# Patient Record
Sex: Female | Born: 1970 | Race: Black or African American | Hispanic: No | Marital: Married | Smoking: Never smoker
Health system: Southern US, Community
[De-identification: ages and names within clinical notes are randomized; demographics above are authoritative.]

## PROBLEM LIST (undated history)

## (undated) DIAGNOSIS — Z8632 Personal history of gestational diabetes: Secondary | ICD-10-CM

## (undated) DIAGNOSIS — N920 Excessive and frequent menstruation with regular cycle: Secondary | ICD-10-CM

## (undated) DIAGNOSIS — D259 Leiomyoma of uterus, unspecified: Secondary | ICD-10-CM

## (undated) DIAGNOSIS — F32A Depression, unspecified: Secondary | ICD-10-CM

## (undated) DIAGNOSIS — D509 Iron deficiency anemia, unspecified: Secondary | ICD-10-CM

## (undated) DIAGNOSIS — Z8719 Personal history of other diseases of the digestive system: Secondary | ICD-10-CM

## (undated) DIAGNOSIS — M810 Age-related osteoporosis without current pathological fracture: Secondary | ICD-10-CM

## (undated) DIAGNOSIS — Z8759 Personal history of other complications of pregnancy, childbirth and the puerperium: Secondary | ICD-10-CM

## (undated) DIAGNOSIS — E785 Hyperlipidemia, unspecified: Secondary | ICD-10-CM

## (undated) DIAGNOSIS — Z8619 Personal history of other infectious and parasitic diseases: Secondary | ICD-10-CM

## (undated) DIAGNOSIS — Z973 Presence of spectacles and contact lenses: Secondary | ICD-10-CM

## (undated) DIAGNOSIS — F419 Anxiety disorder, unspecified: Secondary | ICD-10-CM

## (undated) DIAGNOSIS — I10 Essential (primary) hypertension: Secondary | ICD-10-CM

## (undated) DIAGNOSIS — E119 Type 2 diabetes mellitus without complications: Secondary | ICD-10-CM

## (undated) DIAGNOSIS — N200 Calculus of kidney: Secondary | ICD-10-CM

## (undated) DIAGNOSIS — D271 Benign neoplasm of left ovary: Secondary | ICD-10-CM

## (undated) DIAGNOSIS — M1990 Unspecified osteoarthritis, unspecified site: Secondary | ICD-10-CM

## (undated) HISTORY — DX: Type 2 diabetes mellitus without complications: E11.9

## (undated) HISTORY — DX: Anxiety disorder, unspecified: F41.9

## (undated) HISTORY — PX: ABDOMINAL HYSTERECTOMY: SHX81

## (undated) HISTORY — DX: Depression, unspecified: F32.A

## (undated) HISTORY — DX: Age-related osteoporosis without current pathological fracture: M81.0

---
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
## 1998-03-05 ENCOUNTER — Emergency Department (HOSPITAL_COMMUNITY): Admission: EM | Admit: 1998-03-05 | Discharge: 1998-03-05 | Payer: Self-pay | Admitting: Family Medicine

## 1998-03-26 ENCOUNTER — Encounter: Admission: RE | Admit: 1998-03-26 | Discharge: 1998-06-24 | Payer: Self-pay | Admitting: *Deleted

## 1998-07-03 ENCOUNTER — Encounter: Admission: RE | Admit: 1998-07-03 | Discharge: 1998-07-03 | Payer: Self-pay | Admitting: Family Medicine

## 1998-07-04 ENCOUNTER — Encounter: Admission: RE | Admit: 1998-07-04 | Discharge: 1998-07-04 | Payer: Self-pay | Admitting: Family Medicine

## 1998-08-05 ENCOUNTER — Encounter: Admission: RE | Admit: 1998-08-05 | Discharge: 1998-08-05 | Payer: Self-pay | Admitting: Family Medicine

## 1998-08-25 ENCOUNTER — Encounter: Admission: RE | Admit: 1998-08-25 | Discharge: 1998-08-25 | Payer: Self-pay | Admitting: Family Medicine

## 1998-09-25 ENCOUNTER — Encounter: Admission: RE | Admit: 1998-09-25 | Discharge: 1998-09-25 | Payer: Self-pay | Admitting: Family Medicine

## 1998-10-08 ENCOUNTER — Ambulatory Visit (HOSPITAL_COMMUNITY): Admission: RE | Admit: 1998-10-08 | Discharge: 1998-10-08 | Payer: Self-pay

## 1998-10-27 ENCOUNTER — Encounter: Admission: RE | Admit: 1998-10-27 | Discharge: 1998-10-27 | Payer: Self-pay | Admitting: Family Medicine

## 1998-11-12 ENCOUNTER — Encounter: Admission: RE | Admit: 1998-11-12 | Discharge: 1998-11-12 | Payer: Self-pay | Admitting: Family Medicine

## 1998-11-26 ENCOUNTER — Encounter: Admission: RE | Admit: 1998-11-26 | Discharge: 1998-11-26 | Payer: Self-pay | Admitting: Family Medicine

## 1998-12-12 ENCOUNTER — Encounter: Admission: RE | Admit: 1998-12-12 | Discharge: 1998-12-12 | Payer: Self-pay | Admitting: Family Medicine

## 1999-01-15 ENCOUNTER — Encounter: Admission: RE | Admit: 1999-01-15 | Discharge: 1999-01-15 | Payer: Self-pay | Admitting: Family Medicine

## 1999-01-20 ENCOUNTER — Encounter: Admission: RE | Admit: 1999-01-20 | Discharge: 1999-01-20 | Payer: Self-pay | Admitting: Sports Medicine

## 1999-01-29 ENCOUNTER — Encounter: Admission: RE | Admit: 1999-01-29 | Discharge: 1999-01-29 | Payer: Self-pay | Admitting: Family Medicine

## 1999-01-30 ENCOUNTER — Encounter: Admission: RE | Admit: 1999-01-30 | Discharge: 1999-01-30 | Payer: Self-pay | Admitting: Sports Medicine

## 1999-02-04 ENCOUNTER — Ambulatory Visit (HOSPITAL_COMMUNITY): Admission: RE | Admit: 1999-02-04 | Discharge: 1999-02-04 | Payer: Self-pay | Admitting: *Deleted

## 1999-02-05 ENCOUNTER — Encounter: Admission: RE | Admit: 1999-02-05 | Discharge: 1999-05-06 | Payer: Self-pay | Admitting: *Deleted

## 1999-02-06 ENCOUNTER — Encounter: Admission: RE | Admit: 1999-02-06 | Discharge: 1999-02-06 | Payer: Self-pay | Admitting: Family Medicine

## 1999-02-13 ENCOUNTER — Encounter: Admission: RE | Admit: 1999-02-13 | Discharge: 1999-02-13 | Payer: Self-pay | Admitting: Family Medicine

## 1999-02-26 ENCOUNTER — Inpatient Hospital Stay (HOSPITAL_COMMUNITY): Admission: AD | Admit: 1999-02-26 | Discharge: 1999-02-26 | Payer: Self-pay | Admitting: *Deleted

## 1999-02-27 ENCOUNTER — Encounter (INDEPENDENT_AMBULATORY_CARE_PROVIDER_SITE_OTHER): Payer: Self-pay

## 1999-02-27 ENCOUNTER — Inpatient Hospital Stay (HOSPITAL_COMMUNITY): Admission: AD | Admit: 1999-02-27 | Discharge: 1999-03-03 | Payer: Self-pay | Admitting: *Deleted

## 1999-03-03 ENCOUNTER — Encounter: Admission: RE | Admit: 1999-03-03 | Discharge: 1999-03-03 | Payer: Self-pay | Admitting: Family Medicine

## 1999-05-04 ENCOUNTER — Other Ambulatory Visit: Admission: RE | Admit: 1999-05-04 | Discharge: 1999-05-04 | Payer: Self-pay | Admitting: Emergency Medicine

## 1999-05-04 ENCOUNTER — Encounter: Admission: RE | Admit: 1999-05-04 | Discharge: 1999-05-04 | Payer: Self-pay | Admitting: Family Medicine

## 1999-05-11 ENCOUNTER — Encounter: Admission: RE | Admit: 1999-05-11 | Discharge: 1999-05-11 | Payer: Self-pay | Admitting: Family Medicine

## 1999-05-18 ENCOUNTER — Encounter: Admission: RE | Admit: 1999-05-18 | Discharge: 1999-05-18 | Payer: Self-pay | Admitting: Family Medicine

## 1999-07-06 ENCOUNTER — Encounter: Admission: RE | Admit: 1999-07-06 | Discharge: 1999-07-06 | Payer: Self-pay | Admitting: Family Medicine

## 1999-07-29 ENCOUNTER — Encounter: Admission: RE | Admit: 1999-07-29 | Discharge: 1999-07-29 | Payer: Self-pay | Admitting: Sports Medicine

## 1999-08-13 ENCOUNTER — Encounter: Admission: RE | Admit: 1999-08-13 | Discharge: 1999-08-13 | Payer: Self-pay | Admitting: Family Medicine

## 1999-09-14 ENCOUNTER — Encounter: Admission: RE | Admit: 1999-09-14 | Discharge: 1999-09-14 | Payer: Self-pay | Admitting: Family Medicine

## 2000-07-04 ENCOUNTER — Encounter: Admission: RE | Admit: 2000-07-04 | Discharge: 2000-07-04 | Payer: Self-pay | Admitting: Family Medicine

## 2000-11-14 ENCOUNTER — Encounter: Admission: RE | Admit: 2000-11-14 | Discharge: 2000-11-14 | Payer: Self-pay | Admitting: Family Medicine

## 2002-04-26 ENCOUNTER — Emergency Department (HOSPITAL_COMMUNITY): Admission: EM | Admit: 2002-04-26 | Discharge: 2002-04-26 | Payer: Self-pay | Admitting: Emergency Medicine

## 2002-04-27 ENCOUNTER — Encounter: Admission: RE | Admit: 2002-04-27 | Discharge: 2002-04-27 | Payer: Self-pay | Admitting: Family Medicine

## 2002-10-23 ENCOUNTER — Encounter: Admission: RE | Admit: 2002-10-23 | Discharge: 2002-10-23 | Payer: Self-pay | Admitting: Family Medicine

## 2002-10-25 ENCOUNTER — Encounter: Admission: RE | Admit: 2002-10-25 | Discharge: 2002-10-25 | Payer: Self-pay | Admitting: Family Medicine

## 2004-09-09 ENCOUNTER — Ambulatory Visit: Payer: Self-pay | Admitting: Family Medicine

## 2004-10-03 ENCOUNTER — Encounter (INDEPENDENT_AMBULATORY_CARE_PROVIDER_SITE_OTHER): Payer: Self-pay | Admitting: *Deleted

## 2004-10-03 LAB — CONVERTED CEMR LAB

## 2004-10-08 ENCOUNTER — Ambulatory Visit: Payer: Self-pay | Admitting: Family Medicine

## 2004-11-12 ENCOUNTER — Ambulatory Visit: Payer: Self-pay | Admitting: Family Medicine

## 2004-12-10 ENCOUNTER — Ambulatory Visit (HOSPITAL_COMMUNITY): Admission: RE | Admit: 2004-12-10 | Discharge: 2004-12-10 | Payer: Self-pay | Admitting: Family Medicine

## 2004-12-14 ENCOUNTER — Ambulatory Visit: Payer: Self-pay | Admitting: Family Medicine

## 2005-01-04 ENCOUNTER — Ambulatory Visit: Payer: Self-pay | Admitting: Family Medicine

## 2005-01-05 ENCOUNTER — Ambulatory Visit: Payer: Self-pay | Admitting: Family Medicine

## 2005-01-11 ENCOUNTER — Ambulatory Visit: Payer: Self-pay | Admitting: Family Medicine

## 2005-01-16 ENCOUNTER — Ambulatory Visit: Payer: Self-pay | Admitting: Obstetrics & Gynecology

## 2005-01-16 ENCOUNTER — Inpatient Hospital Stay (HOSPITAL_COMMUNITY): Admission: AD | Admit: 2005-01-16 | Discharge: 2005-01-16 | Payer: Self-pay | Admitting: Obstetrics & Gynecology

## 2005-01-26 ENCOUNTER — Ambulatory Visit: Payer: Self-pay | Admitting: Family Medicine

## 2005-01-29 ENCOUNTER — Ambulatory Visit: Payer: Self-pay | Admitting: Family Medicine

## 2005-02-02 ENCOUNTER — Encounter: Admission: RE | Admit: 2005-02-02 | Discharge: 2005-02-02 | Payer: Self-pay | Admitting: Family Medicine

## 2005-02-03 ENCOUNTER — Ambulatory Visit: Payer: Self-pay | Admitting: Family Medicine

## 2005-02-10 ENCOUNTER — Ambulatory Visit (HOSPITAL_COMMUNITY): Admission: RE | Admit: 2005-02-10 | Discharge: 2005-02-10 | Payer: Self-pay | Admitting: Obstetrics & Gynecology

## 2005-02-18 ENCOUNTER — Ambulatory Visit: Payer: Self-pay | Admitting: Sports Medicine

## 2005-03-02 ENCOUNTER — Ambulatory Visit: Payer: Self-pay | Admitting: Sports Medicine

## 2005-03-05 ENCOUNTER — Ambulatory Visit: Payer: Self-pay | Admitting: *Deleted

## 2005-03-09 ENCOUNTER — Ambulatory Visit: Payer: Self-pay | Admitting: *Deleted

## 2005-03-11 ENCOUNTER — Ambulatory Visit: Payer: Self-pay | Admitting: *Deleted

## 2005-03-15 ENCOUNTER — Ambulatory Visit: Payer: Self-pay | Admitting: Obstetrics & Gynecology

## 2005-03-16 ENCOUNTER — Ambulatory Visit: Payer: Self-pay | Admitting: Family Medicine

## 2005-03-23 ENCOUNTER — Ambulatory Visit: Payer: Self-pay | Admitting: *Deleted

## 2005-03-24 ENCOUNTER — Ambulatory Visit: Payer: Self-pay | Admitting: Family Medicine

## 2005-03-25 ENCOUNTER — Ambulatory Visit: Payer: Self-pay | Admitting: Obstetrics & Gynecology

## 2005-03-30 ENCOUNTER — Ambulatory Visit: Payer: Self-pay | Admitting: Obstetrics & Gynecology

## 2005-03-30 ENCOUNTER — Ambulatory Visit (HOSPITAL_COMMUNITY): Admission: RE | Admit: 2005-03-30 | Discharge: 2005-03-30 | Payer: Self-pay | Admitting: Family Medicine

## 2005-04-02 ENCOUNTER — Ambulatory Visit: Payer: Self-pay | Admitting: Family Medicine

## 2005-04-06 ENCOUNTER — Ambulatory Visit: Payer: Self-pay | Admitting: *Deleted

## 2005-04-07 ENCOUNTER — Ambulatory Visit: Payer: Self-pay | Admitting: Family Medicine

## 2005-04-09 ENCOUNTER — Ambulatory Visit: Payer: Self-pay | Admitting: Obstetrics and Gynecology

## 2005-04-09 ENCOUNTER — Ambulatory Visit: Payer: Self-pay | Admitting: *Deleted

## 2005-04-09 ENCOUNTER — Inpatient Hospital Stay (HOSPITAL_COMMUNITY): Admission: AD | Admit: 2005-04-09 | Discharge: 2005-04-12 | Payer: Self-pay | Admitting: *Deleted

## 2005-05-19 ENCOUNTER — Ambulatory Visit: Payer: Self-pay | Admitting: Family Medicine

## 2005-05-31 ENCOUNTER — Ambulatory Visit: Payer: Self-pay | Admitting: Family Medicine

## 2005-08-04 ENCOUNTER — Ambulatory Visit: Payer: Self-pay | Admitting: Family Medicine

## 2005-08-25 ENCOUNTER — Ambulatory Visit: Payer: Self-pay | Admitting: Family Medicine

## 2005-09-03 DIAGNOSIS — Z8619 Personal history of other infectious and parasitic diseases: Secondary | ICD-10-CM

## 2005-09-03 HISTORY — DX: Personal history of other infectious and parasitic diseases: Z86.19

## 2005-10-02 ENCOUNTER — Inpatient Hospital Stay (HOSPITAL_COMMUNITY): Admission: EM | Admit: 2005-10-02 | Discharge: 2005-10-06 | Payer: Self-pay | Admitting: Emergency Medicine

## 2005-10-04 ENCOUNTER — Encounter (INDEPENDENT_AMBULATORY_CARE_PROVIDER_SITE_OTHER): Payer: Self-pay | Admitting: Specialist

## 2005-10-04 HISTORY — PX: LAPAROSCOPIC CHOLECYSTECTOMY: SUR755

## 2005-10-09 ENCOUNTER — Inpatient Hospital Stay (HOSPITAL_COMMUNITY): Admission: EM | Admit: 2005-10-09 | Discharge: 2005-10-12 | Payer: Self-pay | Admitting: Emergency Medicine

## 2005-10-11 HISTORY — PX: ERCP W/ SPHINCTEROTOMY AND BALLOON DILATION: SHX1524

## 2006-06-02 DIAGNOSIS — I10 Essential (primary) hypertension: Secondary | ICD-10-CM | POA: Insufficient documentation

## 2006-06-03 ENCOUNTER — Encounter (INDEPENDENT_AMBULATORY_CARE_PROVIDER_SITE_OTHER): Payer: Self-pay | Admitting: *Deleted

## 2007-05-04 ENCOUNTER — Encounter (INDEPENDENT_AMBULATORY_CARE_PROVIDER_SITE_OTHER): Payer: Self-pay | Admitting: *Deleted

## 2007-05-15 ENCOUNTER — Ambulatory Visit: Payer: Self-pay | Admitting: Sports Medicine

## 2007-05-15 ENCOUNTER — Encounter: Payer: Self-pay | Admitting: Family Medicine

## 2007-05-15 LAB — CONVERTED CEMR LAB
Antibody Screen: NEGATIVE
Basophils Relative: 0 % (ref 0–1)
Beta hcg, urine, semiquantitative: POSITIVE
Eosinophils Absolute: 0.1 10*3/uL (ref 0.0–0.7)
HCT: 33.7 % — ABNORMAL LOW (ref 36.0–46.0)
Lymphocytes Relative: 29 % (ref 12–46)
MCV: 89.6 fL (ref 78.0–100.0)
Monocytes Relative: 7 % (ref 3–12)
Neutro Abs: 4.7 10*3/uL (ref 1.7–7.7)
Neutrophils Relative %: 63 % (ref 43–77)
Platelets: 303 10*3/uL (ref 150–400)
RBC: 3.76 M/uL — ABNORMAL LOW (ref 3.87–5.11)
RDW: 13.8 % (ref 11.5–15.5)
Rh Type: POSITIVE
Rubella: 116.1 intl units/mL — ABNORMAL HIGH
WBC: 7.5 10*3/uL (ref 4.0–10.5)

## 2007-05-16 ENCOUNTER — Encounter: Payer: Self-pay | Admitting: Family Medicine

## 2007-05-17 ENCOUNTER — Telehealth (INDEPENDENT_AMBULATORY_CARE_PROVIDER_SITE_OTHER): Payer: Self-pay | Admitting: *Deleted

## 2007-05-19 ENCOUNTER — Ambulatory Visit: Payer: Self-pay | Admitting: Family Medicine

## 2007-05-22 ENCOUNTER — Ambulatory Visit: Payer: Self-pay | Admitting: Family Medicine

## 2007-05-23 ENCOUNTER — Encounter: Payer: Self-pay | Admitting: Family Medicine

## 2007-05-23 ENCOUNTER — Ambulatory Visit: Payer: Self-pay | Admitting: Family Medicine

## 2007-05-25 ENCOUNTER — Telehealth (INDEPENDENT_AMBULATORY_CARE_PROVIDER_SITE_OTHER): Payer: Self-pay | Admitting: *Deleted

## 2007-05-30 ENCOUNTER — Ambulatory Visit: Payer: Self-pay | Admitting: Family Medicine

## 2007-05-30 ENCOUNTER — Encounter: Payer: Self-pay | Admitting: Family Medicine

## 2007-06-12 ENCOUNTER — Encounter: Payer: Self-pay | Admitting: Family Medicine

## 2007-06-13 ENCOUNTER — Telehealth: Payer: Self-pay | Admitting: Family Medicine

## 2007-06-22 ENCOUNTER — Encounter: Payer: Self-pay | Admitting: Family Medicine

## 2007-06-22 ENCOUNTER — Ambulatory Visit: Payer: Self-pay | Admitting: Family Medicine

## 2007-06-26 ENCOUNTER — Ambulatory Visit: Payer: Self-pay | Admitting: Family Medicine

## 2007-06-26 ENCOUNTER — Encounter: Admission: RE | Admit: 2007-06-26 | Discharge: 2007-06-26 | Payer: Self-pay | Admitting: Family Medicine

## 2007-06-27 ENCOUNTER — Ambulatory Visit: Payer: Self-pay | Admitting: Obstetrics and Gynecology

## 2007-07-03 ENCOUNTER — Ambulatory Visit: Payer: Self-pay | Admitting: Obstetrics & Gynecology

## 2007-07-10 ENCOUNTER — Ambulatory Visit: Payer: Self-pay | Admitting: Obstetrics & Gynecology

## 2007-07-24 ENCOUNTER — Ambulatory Visit: Payer: Self-pay | Admitting: Obstetrics & Gynecology

## 2007-07-24 ENCOUNTER — Ambulatory Visit (HOSPITAL_COMMUNITY): Admission: RE | Admit: 2007-07-24 | Discharge: 2007-07-24 | Payer: Self-pay | Admitting: Obstetrics & Gynecology

## 2007-08-08 ENCOUNTER — Encounter: Payer: Self-pay | Admitting: Family Medicine

## 2007-08-14 ENCOUNTER — Ambulatory Visit: Payer: Self-pay | Admitting: Obstetrics & Gynecology

## 2007-08-24 ENCOUNTER — Ambulatory Visit: Payer: Self-pay | Admitting: Obstetrics & Gynecology

## 2007-09-11 ENCOUNTER — Ambulatory Visit (HOSPITAL_COMMUNITY): Admission: RE | Admit: 2007-09-11 | Discharge: 2007-09-11 | Payer: Self-pay | Admitting: Obstetrics & Gynecology

## 2007-09-11 ENCOUNTER — Ambulatory Visit: Payer: Self-pay | Admitting: Obstetrics & Gynecology

## 2007-10-02 ENCOUNTER — Ambulatory Visit (HOSPITAL_COMMUNITY): Admission: RE | Admit: 2007-10-02 | Discharge: 2007-10-02 | Payer: Self-pay | Admitting: Gynecology

## 2007-10-02 ENCOUNTER — Ambulatory Visit: Payer: Self-pay | Admitting: Family Medicine

## 2007-10-16 ENCOUNTER — Ambulatory Visit: Payer: Self-pay | Admitting: Obstetrics & Gynecology

## 2007-10-23 ENCOUNTER — Ambulatory Visit: Payer: Self-pay | Admitting: Obstetrics & Gynecology

## 2007-11-06 ENCOUNTER — Ambulatory Visit: Payer: Self-pay | Admitting: Family Medicine

## 2007-11-19 ENCOUNTER — Ambulatory Visit: Payer: Self-pay | Admitting: Family Medicine

## 2007-11-19 ENCOUNTER — Inpatient Hospital Stay (HOSPITAL_COMMUNITY): Admission: AD | Admit: 2007-11-19 | Discharge: 2007-11-22 | Payer: Self-pay | Admitting: Obstetrics & Gynecology

## 2007-11-24 ENCOUNTER — Telehealth: Payer: Self-pay | Admitting: Family Medicine

## 2007-12-22 ENCOUNTER — Encounter: Payer: Self-pay | Admitting: *Deleted

## 2008-01-02 ENCOUNTER — Ambulatory Visit: Payer: Self-pay | Admitting: Family Medicine

## 2008-04-23 ENCOUNTER — Telehealth: Payer: Self-pay | Admitting: *Deleted

## 2008-05-03 ENCOUNTER — Ambulatory Visit: Payer: Self-pay | Admitting: Family Medicine

## 2008-05-13 ENCOUNTER — Ambulatory Visit: Payer: Self-pay | Admitting: Family Medicine

## 2008-07-12 ENCOUNTER — Ambulatory Visit (HOSPITAL_COMMUNITY): Admission: RE | Admit: 2008-07-12 | Discharge: 2008-07-12 | Payer: Self-pay | Admitting: Family Medicine

## 2008-07-12 ENCOUNTER — Ambulatory Visit: Payer: Self-pay | Admitting: Family Medicine

## 2008-07-16 ENCOUNTER — Ambulatory Visit: Payer: Self-pay | Admitting: Family Medicine

## 2008-07-16 DIAGNOSIS — T8339XA Other mechanical complication of intrauterine contraceptive device, initial encounter: Secondary | ICD-10-CM

## 2008-08-14 ENCOUNTER — Telehealth: Payer: Self-pay | Admitting: Family Medicine

## 2009-09-04 ENCOUNTER — Ambulatory Visit: Payer: Self-pay | Admitting: Family Medicine

## 2009-09-04 DIAGNOSIS — M654 Radial styloid tenosynovitis [de Quervain]: Secondary | ICD-10-CM

## 2009-09-04 DIAGNOSIS — J029 Acute pharyngitis, unspecified: Secondary | ICD-10-CM | POA: Insufficient documentation

## 2009-09-04 LAB — CONVERTED CEMR LAB: Rapid Strep: NEGATIVE

## 2009-10-17 ENCOUNTER — Telehealth (INDEPENDENT_AMBULATORY_CARE_PROVIDER_SITE_OTHER): Payer: Self-pay | Admitting: *Deleted

## 2009-10-20 ENCOUNTER — Ambulatory Visit: Payer: Self-pay | Admitting: Family Medicine

## 2009-10-21 LAB — CONVERTED CEMR LAB
BUN: 14 mg/dL (ref 6–23)
CO2: 27 meq/L (ref 19–32)
Calcium: 8.6 mg/dL (ref 8.4–10.5)
GFR calc non Af Amer: 95.36 mL/min (ref >60)
HDL: 51.4 mg/dL (ref >39.00)
Potassium: 4.4 meq/L (ref 3.5–5.1)
Total CHOL/HDL Ratio: 4
VLDL: 6.8 mg/dL (ref 0.0–40.0)

## 2009-10-23 ENCOUNTER — Ambulatory Visit: Payer: Self-pay | Admitting: Family Medicine

## 2009-10-23 DIAGNOSIS — E1169 Type 2 diabetes mellitus with other specified complication: Secondary | ICD-10-CM | POA: Insufficient documentation

## 2009-10-23 DIAGNOSIS — E785 Hyperlipidemia, unspecified: Secondary | ICD-10-CM

## 2009-12-22 ENCOUNTER — Ambulatory Visit: Payer: Self-pay | Admitting: Family Medicine

## 2010-04-13 ENCOUNTER — Emergency Department (HOSPITAL_COMMUNITY)
Admission: EM | Admit: 2010-04-13 | Discharge: 2010-04-13 | Payer: Self-pay | Source: Home / Self Care | Admitting: Family Medicine

## 2010-04-13 ENCOUNTER — Telehealth: Payer: Self-pay | Admitting: Family Medicine

## 2010-04-15 ENCOUNTER — Ambulatory Visit
Admission: RE | Admit: 2010-04-15 | Discharge: 2010-04-15 | Payer: Self-pay | Source: Home / Self Care | Attending: Family Medicine | Admitting: Family Medicine

## 2010-04-15 DIAGNOSIS — L0291 Cutaneous abscess, unspecified: Secondary | ICD-10-CM | POA: Insufficient documentation

## 2010-04-15 DIAGNOSIS — L0390 Cellulitis, unspecified: Secondary | ICD-10-CM

## 2010-04-16 ENCOUNTER — Encounter: Payer: Self-pay | Admitting: Family Medicine

## 2010-04-16 ENCOUNTER — Ambulatory Visit
Admission: RE | Admit: 2010-04-16 | Discharge: 2010-04-16 | Payer: Self-pay | Source: Home / Self Care | Attending: Family Medicine | Admitting: Family Medicine

## 2010-04-17 ENCOUNTER — Encounter: Payer: Self-pay | Admitting: Family Medicine

## 2010-04-20 ENCOUNTER — Ambulatory Visit
Admission: RE | Admit: 2010-04-20 | Discharge: 2010-04-20 | Payer: Self-pay | Source: Home / Self Care | Attending: Family Medicine | Admitting: Family Medicine

## 2010-04-22 ENCOUNTER — Encounter: Payer: Self-pay | Admitting: Family Medicine

## 2010-04-27 ENCOUNTER — Encounter: Payer: Self-pay | Admitting: *Deleted

## 2010-05-05 NOTE — Progress Notes (Signed)
----   Converted from flag ---- ---- 10/17/2009 11:39 AM, Ruthe Mannan MD wrote: BMET (v70.0), FLP ( 77.91)  ---- 10/17/2009 11:33 AM, Mills Koller wrote: Patient coming in for CPX labs Monday, need orders and dx.... Thanks, T ------------------------------

## 2010-05-05 NOTE — Letter (Signed)
Summary: [facility] [facility] [employer]  [facility] [employer]   Imported By: Lester Benton 01/01/2010 96:29:52  _____________________________________________________________________  External Attachment:    Type:   Image     Comment:   External Document

## 2010-05-05 NOTE — Assessment & Plan Note (Signed)
Summary: NEW PT TO EST/PER DR Barbara [name]/[facility]   Vital Signs:  Patient profile:   40 year old female Height:      69.25 inches Weight:      217.25 pounds BMI:     31.97 Temp:     98.8 degrees F oral Pulse rate:   72 / minute Pulse rhythm:   regular BP sitting:   130 / 80  (right arm) Cuff size:   large  Vitals Entered By: Barbara [name] CMA Barbara [name]) (September 04, 2009 3:16 PM) CC: new patient   History of Present Illness: 40 yo here to re establish care with me ( was my patient at [facility]).  1.  Sore throat- ongoing for last two weeks.  Associated with subjective fever, ear pain, headache.  No cough, runny nose or other URI symptoms.  Has 3 small children at home.    2.  Left hand tingling.  Carries her smallest child on that arm frequently and noticed tingling around her thumb and index finger.  No weakness or pain.  Never had anything like this before.  3.  Well woman- due for pap, FLP, BMET. Husband has vasectomy now and would like her IUD removed.  Current Medications (verified): 1)  Penicillin V Potassium 500 Mg Tabs (Penicillin V Potassium) .Marland Kitchen.. 1 Tab By Mouth Three Times A Day X 10 Days  Allergies (verified): No Known Drug Allergies  Past History:  Past Medical History: Last updated: 06/02/2006 Anemia, acute blood loss in high school, h/o Glucose intolerance during pregnancy, h/o PIH (Severe, required MgSO4), Paronychia, toe 681.11, Postpartum endometritis  Past Surgical History: Last updated: 06/02/2006 LTCS - 02/04/1999, VBAC 1/06 - 05/19/2005  Family History: Last updated: 06/02/2006 STRONG FHx for DM  Social History: Last updated: 07/16/2008 Married to Barbara [name], had son Barbara [name] 11/00, Barbara [name], and Barbara [name]; Works as Editor, commissioning; Never smoked, no alcohol  Risk Factors: Smoking Status: never (07/16/2008) Packs/Day: n/a (05/23/2007)  Social History: Reviewed history from 07/16/2008 and no changes required. Married to Barbara [name], had son Barbara [name] 11/00,  Barbara [name], and Barbara [name]; Works as Editor, commissioning; Never smoked, no alcohol  Review of Systems      See HPI General:  Complains of chills and fever. ENT:  Complains of sore throat; denies difficulty swallowing, nasal congestion, postnasal drainage, and sinus pressure. CV:  Denies chest pain or discomfort. Resp:  Denies cough. GI:  Denies abdominal pain. MS:  Denies joint pain, joint redness, and joint swelling. Derm:  Denies rash. Neuro:  Complains of headaches. Psych:  Denies anxiety and depression. Endo:  Denies weight change.  Physical Exam  General:  Well-developed,well-nourished,in no acute distress; alert,appropriate and cooperative throughout examination Eyes:  vision grossly intact, pupils equal, and pupils round.   Ears:  R ear normal and L ear normal.   Nose:  External nasal examination shows no deformity or inflammation. Nasal mucosa are pink and moist without lesions or exudates. Mouth:  pharyngeal erythema, pharyngeal exudate, and posterior lymphoid hypertrophy.   Lungs:  Normal respiratory effort, chest expands symmetrically. Lungs are clear to auscultation, no crackles or wheezes. Heart:  Normal rate and regular rhythm. S1 and S2 normal without gallop, murmur, click, rub or other extra sounds. Abdomen:  Bowel sounds positive,abdomen soft and non-tender without masses, organomegaly or hernias noted. Msk:  Left wrist- FROM, normal strength. Pos Finklestein test left Extremities:  No clubbing, cyanosis, edema, or deformity noted with normal full range of motion of all joints.   Neurologic:  alert &  oriented X3.   Skin:  Intact without suspicious lesions or rashes Cervical Nodes:  R anterior LN tender.   Psych:  Cognition and judgment appear intact. Alert and cooperative with normal attention span and concentration. No apparent delusions, illusions, hallucinations   Impression & Recommendations:  Problem # 1:  ACUTE PHARYNGITIS (ICD-462) Assessment New Although Rapid strep is  neg, have all cardinal symptoms and PE exam findings.  will treat for presumptive strep pharyngitis with PCN. Her updated medication list for this problem includes:    Penicillin V Potassium 500 Mg Tabs (Penicillin v potassium) .Marland Kitchen... 1 tab by mouth three times a day x 10 days  Orders: Rapid Strep ([address])  Problem # 2:  DE QUERVAIN'S TENOSYNOVITIS (ICD-727.04) Assessment: New Discussed using NSAIDs and given thumb spica splint.  Pt to follow up 1st week of july when she schedules CPX. Orders: Slings- All Types (U0454)  Problem # 3:  IUD (ICD-V25.1) Assessment: Unchanged Plan IUD removal with Pap.  Complete Medication List: 1)  Penicillin V Potassium 500 Mg Tabs (Penicillin v potassium) .Marland Kitchen.. 1 tab by mouth three times a day x 10 days  Patient Instructions: 1)  Great to see you, Barbara [name]! 2)  Rest your wrist, wear the splint at night- deQuervain's synovitis. 3)  Make an appointment for a complete physical on your way out. Prescriptions: PENICILLIN V POTASSIUM 500 MG TABS (PENICILLIN V POTASSIUM) 1 tab by mouth three times a day x 10 days  #30 x 0   Entered and Authorized by:   Ruthe Mannan MD   Signed by:   Ruthe Mannan MD on 09/04/2009   Method used:   Electronically to        [facility] (retail)       [address]       [place], [place]  [address]       Ph: [phone]       Fax: [phone]   RxID:   [id number]   Prior Medications (reviewed today): None Current Allergies (reviewed today): No known allergies   Laboratory Results    Other Tests  Rapid Strep: negative  Kit Test Internal QC: Positive   (Normal Range: Negative)

## 2010-05-05 NOTE — Assessment & Plan Note (Signed)
Summary: FILL OUT FORM FOR SCHOOL,TB TEST/CLE   Vital Signs:  Patient profile:   40 year old female Height:      69 inches Weight:      211 pounds BMI:     31.27 Temp:     98.6 degrees F oral Pulse rate:   72 / minute Pulse rhythm:   regular BP sitting:   140 / 90  (right arm) Cuff size:   regular  Vitals Entered By: Linde Gillis CMA Duncan Dull) (December 22, 2009 3:59 PM)  Serial Vital Signs/Assessments:  Time      Position  BP       Pulse  Resp  Temp     By                     132/80                         Ruthe Mannan MD  CC: fill out forms for school   History of Present Illness: 40 yo here to fill out forms for school.  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 Stressful working again but she knows she is doing it for her family. Immunizations UTD except for PPD. No h/o previously positive PPD.  Current Medications (verified): 1)  None  Allergies (verified): No Known Drug Allergies  Past History:  Past Medical History: Last updated: 06/02/2006 Anemia, acute blood loss in high school, h/o Glucose intolerance during pregnancy, h/o PIH (Severe, required MgSO4), Paronychia, toe 681.11, Postpartum endometritis  Past Surgical History: Last updated: 06/02/2006 LTCS - 02/04/1999, VBAC 1/06 - 05/19/2005  Family History: Last updated: 06/02/2006 STRONG FHx for DM  Social History: Last updated: 07/16/2008 Married to [place], had son [name] 11/00, [name], and [place]; Works as Editor, commissioning; Never smoked, no alcohol  Risk Factors: Smoking Status: never (07/16/2008) Packs/Day: n/a (05/23/2007)  Review of Systems      See HPI General:  Denies malaise. Eyes:  Denies blurring. ENT:  Denies difficulty swallowing. CV:  Denies chest pain or discomfort. Resp:  Denies shortness of breath. GI:  Denies abdominal pain and change in bowel habits. GU:  Denies abnormal vaginal bleeding, dysuria, and urinary frequency. MS:  Denies joint pain,  joint redness, joint swelling, and loss of strength. Derm:  Denies rash. Neuro:  Denies headaches. Psych:  Denies anxiety, depression, sense of great danger, suicidal thoughts/plans, thoughts of violence, unusual visions or sounds, and thoughts /plans of harming others. Endo:  Denies cold intolerance and heat intolerance. Heme:  Denies abnormal bruising.  Physical Exam  General:  Well-developed,well-nourished,in no acute distress; alert,appropriate and cooperative throughout examination Head:  normocephalic and atraumatic.   Eyes:  vision grossly intact, pupils equal, pupils round, and pupils reactive to light.   Ears:  R ear normal and L ear normal.   Nose:  no external deformity.   Mouth:  good dentition.   Lungs:  Normal respiratory effort, chest expands symmetrically. Lungs are clear to auscultation, no crackles or wheezes. Heart:  Normal rate and regular rhythm. S1 and S2 normal without gallop, murmur, click, rub or other extra sounds. Abdomen:  Bowel sounds positive,abdomen soft and non-tender without masses, organomegaly or hernias noted. Msk:  No deformity or scoliosis noted of thoracic or lumbar spine.   Extremities:  No clubbing, cyanosis, edema, or deformity noted with normal full range of motion of all joints.   Neurologic:  alert & oriented X3  and gait normal.   Skin:  Intact without suspicious lesions or rashes Psych:  Cognition and judgment appear intact. Alert and cooperative with normal attention span and concentration. No apparent delusions, illusions, hallucinations   Impression & Recommendations:  Problem # 1:  Sports physical (other med exam for admin purpose) (ICD-V70.3) ppd placed. 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  Prior Medications (reviewed today): None Current Allergies (reviewed today): No known allergies   Appended Document: FILL OUT FORM FOR SCHOOL,TB  TEST/CLE     Vision Screening:Left eye with correction: 20 / 10 Right eye with correction: 20 / 10 Both eyes with correction: 20 / 10  Color vision testing: normal      Vision Entered By: Linde Gillis CMA Duncan Dull) (December 22, 2009 4:26 PM)  Hearing Screen  20db HL: Left  500 hz: 20db 1000 hz: 20db 2000 hz: 20db 4000 hz: 20db Right  500 hz: 20db 1000 hz: 20db 2000 hz: 20db 4000 hz: 20db   Hearing Testing Entered By: Linde Gillis CMA Duncan Dull) (December 22, 2009 4:26 PM)   Allergies: No Known Drug Allergies   Other Orders: TB Skin Test [id number]) Admin 1st Vaccine (11914)    Immunizations Administered:  PPD Skin Test:    Vaccine Type: PPD    Site: right forearm    Mfr: Sanofi Pasteur    Dose: 0.1 ml    Route: ID    Given by: Linde Gillis CMA (AAMA)    Exp. Date: 02/05/2011    Lot #: [id number]  Appended Document: FILL OUT FORM FOR SCHOOL,TB TEST/CLE     Allergies: No Known Drug Allergies     PPD Results    Date of reading: 12/24/2009    Results: 0mm    Interpretation: negative

## 2010-05-05 NOTE — Letter (Signed)
Summary: [employer]  [employer]   Imported By: Lester Lindon 01/01/2010 10:32:37  _____________________________________________________________________  External Attachment:    Type:   Image     Comment:   External Document

## 2010-05-05 NOTE — Assessment & Plan Note (Signed)
Summary: CPX W/PAP/[facility]   Vital Signs:  Patient profile:   40 year old female Height:      69 inches Weight:      216.38 pounds Temp:     99.0 degrees F oral Pulse rate:   70 / minute Pulse rhythm:   regular BP sitting:   122 / 82  (right arm) Cuff size:   large  Vitals Entered By: Janee Morn CMA (October 23, 2009 9:28 AM) CC: CPX   History of Present Illness: 40 yo female here for CPX. Wants to defer pap beacause she is currently menstruating.  Hyperlipidemia- lipid panel this week- LDL 162, HDL 51, TG 34. She has really been working on exercising, now walks or runs 4-5 times per week. However, she knows her diet has gotten worse. Eats a lot of fast food because it's quick and she has 3 small children. Also drinks sweet tea.  Glucose intolerance- fasting CBG 108.  Does have a h/o gestational diabetes with both pregnanies, diet controlled.  Admits to drinking sweet tea and eating foods high in carbs/sugar. No increased thirst or urination.  Does have strong FH of DM.  Well woman- G3P3, has mirena IUD which has truly regulated her mentrual cycle.  Although husband has had vasectomy, she wants to keep IUD in place for menstrual regulation.  Current Medications (verified): 1)  None  Allergies (verified): No Known Drug Allergies  Past History:  Past Medical History: Last updated: 06/02/2006 Anemia, acute blood loss in high school, h/o Glucose intolerance during pregnancy, h/o PIH (Severe, required MgSO4), Paronychia, toe 681.11, Postpartum endometritis  Past Surgical History: Last updated: 06/02/2006 LTCS - 02/04/1999, VBAC 1/06 - 05/19/2005  Family History: Last updated: 06/02/2006 STRONG FHx for DM  Social History: Last updated: 07/16/2008 Married to [place], had son [name] 11/00, [name], and [place]; Works as Editor, commissioning; Never smoked, no alcohol  Risk Factors: Smoking Status: never (07/16/2008) Packs/Day: n/a (05/23/2007)  Review of Systems      See  HPI General:  Denies loss of appetite and malaise. Eyes:  Denies blurring. ENT:  Denies difficulty swallowing. CV:  Denies chest pain or discomfort. Resp:  Denies shortness of breath. GI:  Denies abdominal pain, bloody stools, and change in bowel habits. GU:  Denies abnormal vaginal bleeding, urinary frequency, and urinary hesitancy. MS:  Denies joint pain, joint redness, and joint swelling. Derm:  Denies rash. Neuro:  Denies headaches and visual disturbances. Psych:  Denies anxiety and depression. Endo:  Denies cold intolerance and heat intolerance. Heme:  Denies abnormal bruising and bleeding.  Physical Exam  General:  Well-developed,well-nourished,in no acute distress; alert,appropriate and cooperative throughout examination Head:  normocephalic and atraumatic.   Eyes:  vision grossly intact, pupils equal, pupils round, and pupils reactive to light.   Ears:  R ear normal and L ear normal.   Nose:  no external deformity.   Mouth:  good dentition.   Breasts:  No mass, nodules, thickening, tenderness, bulging, retraction, inflamation, nipple discharge or skin changes noted.   Lungs:  Normal respiratory effort, chest expands symmetrically. Lungs are clear to auscultation, no crackles or wheezes. Heart:  Normal rate and regular rhythm. S1 and S2 normal without gallop, murmur, click, rub or other extra sounds. Abdomen:  Bowel sounds positive,abdomen soft and non-tender without masses, organomegaly or hernias noted. Msk:  No deformity or scoliosis noted of thoracic or lumbar spine.   Extremities:  No clubbing, cyanosis, edema, or deformity noted with normal full range of  motion of all joints.   Neurologic:  alert & oriented X3 and gait normal.   Skin:  Intact without suspicious lesions or rashes Psych:  Cognition and judgment appear intact. Alert and cooperative with normal attention span and concentration. No apparent delusions, illusions, hallucinations   Impression &  Recommendations:  Problem # 1:  Preventive Health Care (ICD-V70.0) Reviewed preventive care protocols, scheduled due services, and updated immunizations Discussed nutrition, exercise, diet, and healthy lifestyle.  UTD on prevention, defer pap.  Problem # 2:  HYPERLIPIDEMIA (ICD-272.4) Assessment: New Discussed dietary changes (see pt instructions for details).  Recheck in 3 months, if remains elevated, will start meds.  Patient Instructions: 1)   Weight loss (even small amount) can decrease cholesterol.  Decrease added sugars, eliminate trans fats, increase fiber and limit alcohol.  All these changes together can drop cholesterol by almost 50%. 2)  Come back in 3 months for your pap and we will recheck your cholesterol and sugar then (make morning appointment).  Prior Medications (reviewed today): None Current Allergies (reviewed today): No known allergies

## 2010-05-07 NOTE — Assessment & Plan Note (Signed)
Summary: F/U [facility] 04/13/10/[facility]   Vital Signs:  Patient profile:   40 year old female Height:      69 inches Weight:      209.50 pounds BMI:     31.05 Temp:     99.0 degrees F oral Pulse rate:   72 / minute Pulse rhythm:   regular BP sitting:   140 / 90  (left arm) Cuff size:   regular  Vitals Entered By: Linde Gillis CMA Duncan Dull) (April 15, 2010 11:36 AM) CC: Urgent care follow up   History of Present Illness: 40 yo here for urgent care follow up.  Notes reviewed. Seen on 1/09 for 2 days of red, warm abscess on her abdomen. Fever with Tmax 101, currently afebrile. Was not drained at urgent care. Advised warm compresses and given Bactrim DS- 1 tab by mouth two times a day x 10 days and hydrocodone. Erythema has decreased a little and abscess drained for first time in shower this morning, producing a lot of bloody pus. Denies any nausea currently.    Current Medications (verified): 1)  None  Allergies (verified): No Known Drug Allergies  Past History:  Past Medical History: Last updated: 06/02/2006 Anemia, acute blood loss in high school, h/o Glucose intolerance during pregnancy, h/o PIH (Severe, required MgSO4), Paronychia, toe 681.11, Postpartum endometritis  Past Surgical History: Last updated: 06/02/2006 LTCS - 02/04/1999, VBAC 1/06 - 05/19/2005  Family History: Last updated: 06/02/2006 STRONG FHx for DM  Social History: Last updated: 07/16/2008 Married to [place], had son [name] 11/00, [name], and [place]; Works as Editor, commissioning; Never smoked, no alcohol  Risk Factors: Smoking Status: never (07/16/2008) Packs/Day: n/a (05/23/2007)  Review of Systems      See HPI General:  Denies fever. GI:  Denies nausea and vomiting.  Physical Exam  General:  Well-developed,well-nourished,in no acute distress; alert,appropriate and cooperative throughout examination Skin:  firm, open 4 cm abscess, non fluctuant with large area of surrounding  erythema and warmth.  mildly TTP.   Psych:  Cognition and judgment appear intact. Alert and cooperative with normal attention span and concentration. No apparent delusions, illusions, hallucinations   Impression & Recommendations:  Problem # 1:  CELLULITIS AND ABSCESS OF UNSPECIFIED SITE (ICD-682.9) Assessment New Currently nonfluctuant and actively draining.   Will continue Bactrim, pt to call me on Friday with an update.  If abscess or erythema increases in size, will need to change abx (possibly doxy) and consider I and D. Pt to make appt for follow up on Monday  Patient Instructions: 1)  Please make an appt for 30 min follow up on Monday.   Orders Added: 1)  Est. Patient Level IV [[address]109]    Prior Medications (reviewed today): None Current Allergies (reviewed today): No known allergies

## 2010-05-07 NOTE — Assessment & Plan Note (Signed)
Summary: i and d   Vital Signs:  Patient profile:   40 year old female Height:      69 inches Weight:      209.50 pounds BMI:     31.05 Temp:     100.5 degrees F oral Pulse rate:   80 / minute Pulse rhythm:   regular BP sitting:   120 / 82  (left arm) Cuff size:   regular  Vitals Entered By: Linde Gillis CMA Duncan Dull) (April 16, 2010 12:27 PM) CC: I & D procedure   History of Present Illness: 40 yo here for follow up abscess.    5 day h/o warm abscess on her abdomen.  Started Bactrim DS bid on 1/9.  Started draining yesterday in shower, soaked through gauze last night.  Thick, bloody, a little cheesy looking.   Today, fever has returned on Bactrim DS two times a day and she feels abscess in bigger and warmer.  No nausea or vomiting.       Allergies (verified): No Known Drug Allergies  Review of Systems      See HPI  Physical Exam  General:  Well-developed,well-nourished,in no acute distress; alert,appropriate and cooperative throughout examination Febrile Skin:  firm, open 4.5  cm firm abscess on abdomen, small open area with thick yellow discharge, fluctuant area only .25 cm.       Impression & Recommendations:  Problem # 1:  CELLULITIS AND ABSCESS OF UNSPECIFIED SITE (ICD-682.9) Assessment Deteriorated Attempted i and d.  Small incision made with 10 blade- 1 cm (1 cm deep), only small amount of thick bloody discharge.  Attempted to use curved forceps to break up loculations but very firm area.  Given its location along with worsening cellulities, pain and fever on abx, I will not attempt to complete a wide incision with packing.  Will refer to surgery immediately.  Add Doxycylcine to Bactrim.  Toradol given for pain. Her updated medication list for this problem includes:    Doxycycline Hyclate 100 Mg Caps (Doxycycline hyclate) .Marland Kitchen... Take 1 tab twice a day    Bactrim Ds 800-160 Mg Tabs (Sulfamethoxazole-trimethoprim) .Marland Kitchen... 2 tab by mouth two times a day x 10  days  Orders: Ketorolac-Toradol 15mg  (H0865) Admin of Therapeutic Inj  intramuscular or subcutaneous (78469)  Complete Medication List: 1)  Doxycycline Hyclate 100 Mg Caps (Doxycycline hyclate) .... Take 1 tab twice a day 2)  Bactrim Ds 800-160 Mg Tabs (Sulfamethoxazole-trimethoprim) .... 2 tab by mouth two times a day x 10 days Prescriptions: BACTRIM DS 800-160 MG TABS (SULFAMETHOXAZOLE-TRIMETHOPRIM) 2 tab by mouth two times a day x 10 days  #40 x 0   Entered and Authorized by:   Ruthe Mannan MD   Signed by:   Ruthe Mannan MD on 04/16/2010   Method used:   Electronically to        [facility] (retail)       [address]       [place], [place]  [address]       Ph: [phone]       Fax: [phone]   RxID:   [id number] DOXYCYCLINE HYCLATE 100 MG CAPS (DOXYCYCLINE HYCLATE) Take 1 tab twice a day  #20 x 0   Entered and Authorized by:   Ruthe Mannan MD   Signed by:   Ruthe Mannan MD on 04/16/2010   Method used:   Electronically to        [facility] (retail)       [address]       [place], [place]  [address]       Ph: [phone]       Fax: [phone]   RxID:   [id number]    Medication Administration  Injection # 1:    Medication: Ketorolac-Toradol 15mg     Diagnosis: CELLULITIS AND ABSCESS OF UNSPECIFIED SITE ([phone])    Route: IM    Site: L deltoid    Exp Date: 06/04/2011    Lot #: [id number]    Mfr: Novaplus    Patient tolerated injection without complications    Given by: Linde Gillis CMA Duncan Dull) (April 16, 2010 12:56 PM)  Orders Added: 1)  Ketorolac-Toradol 15mg  [J1885] 2)  Admin of Therapeutic Inj  intramuscular or subcutaneous [96372] 3)  Est. Patient Level IV [03474]     Procedure Note Last Tetanus: Done. (05/12/2005)  Incision & Drainage: Date of onset: 04/11/2010 Indication: changing lesion  Procedure # 1: I & D    Size (in cm): 2.5 x 4.5    Location: abdomen-mid-right     Anesthesia: 1% lidocaine w/epinephrine  Cleaned and prepped with: alcohol and betadine  Current Allergies (reviewed today): No known allergies   Appended Document: i and d     Allergies: No Known Drug Allergies   Complete Medication List: 1)  Doxycycline Hyclate 100 Mg Caps (Doxycycline hyclate) .... Take 1 tab twice a day 2)  Bactrim Ds 800-160 Mg Tabs (Sulfamethoxazole-trimethoprim) .... 2 tab by mouth two times a day x 10 days  Other Orders: Specimen Handling (25956) T-Culture, Wound ([id number])   Orders Added: 1)  Specimen Handling [99000] 2)  T-Culture, Wound [[id number]]

## 2010-05-07 NOTE — Assessment & Plan Note (Signed)
Summary: MAY HAVE TO DRAIN CYST/JRR   Vital Signs:  Patient profile:   40 year old female Weight:      209.25 pounds Temp:     98.8 degrees F oral Pulse rate:   60 / minute Pulse rhythm:   regular BP sitting:   118 / 78  (left arm) Cuff size:   large  Vitals Entered By: Selena Batten Dance CMA (AAMA) (April 20, 2010 11:25 AM) CC: abscess   History of Present Illness: 40 yo here for follow up I and D.  40 yo here for follow up abscess.    Abdominal abscess that worsened on Bactrim DS bid on 1/9.  Remained febrile, referred to surgery on 1/13.    Abscess drained and Edith has been packing on her own. Doing well.  Afebrile.     Feels like the area of incision is not as deep.     Current Medications (verified): 1)  Doxycycline Hyclate 100 Mg Caps (Doxycycline Hyclate) .... Take 1 Tab Twice A Day 2)  Bactrim Ds 800-160 Mg Tabs (Sulfamethoxazole-Trimethoprim) .... 2 Tab By Mouth Two Times A Day X 10 Days  Allergies (verified): No Known Drug Allergies  Review of Systems      See HPI General:  Denies fever. GI:  Denies abdominal pain, nausea, and vomiting.  Physical Exam  General:  Well-developed,well-nourished,in no acute distress; alert,appropriate and cooperative throughout examination afebrile Skin:  3 cm open incision, looks great, small amount of pus and indurated area around it, no erythema or warmth.    Psych:  Cognition and judgment appear intact. Alert and cooperative with normal attention span and concentration. No apparent delusions, illusions, hallucinations   Impression & Recommendations:  Problem # 1:  CELLULITIS AND ABSCESS OF UNSPECIFIED SITE (ICD-682.9) Assessment Improved Looks great.  Continue abx and keep appointment surgeon on Weds. Her updated medication list for this problem includes:    Doxycycline Hyclate 100 Mg Caps (Doxycycline hyclate) .Marland Kitchen... Take 1 tab twice a day    Bactrim Ds 800-160 Mg Tabs (Sulfamethoxazole-trimethoprim) .Marland Kitchen... 2 tab by  mouth two times a day x 10 days  Complete Medication List: 1)  Doxycycline Hyclate 100 Mg Caps (Doxycycline hyclate) .... Take 1 tab twice a day 2)  Bactrim Ds 800-160 Mg Tabs (Sulfamethoxazole-trimethoprim) .... 2 tab by mouth two times a day x 10 days   Orders Added: 1)  Est. Patient Level III [47829]    Current Allergies (reviewed today): No known allergies

## 2010-05-07 NOTE — Progress Notes (Signed)
Summary: Patient request appt today  Phone Note Call from Patient Call back at [phone]   Caller: Patient Call For: Barbara Mannan MD Summary of Call: Patient called and requested to be seen today. Patient states that she had a place that came up on her abdomen Saturday next to her gallbladder scar. Patient states that it is red, very hard, painful, bigger than a 50 cent piece and warm to the touch. Patient wants to know if you can work her in to be seen today?  No appointments available today? Initial call taken by: Sydell Axon LPN,  April 13, 2010 12:15 PM  Follow-up for Phone Call        I am a bit concerned if it is this large and near a surgical site that she will need imaging and possible I & D.  May be best to go to urgent care rather than coming here and being sent out. Barbara Mannan MD  April 13, 2010 12:32 PM  Patient advised as instructed via telephone.  She will go to urgent care as soon as she gets off of work.  Advised her to call us back and let us know how things go.  Follow-up by: Linde Gillis CMA Duncan Dull),  April 13, 2010 12:36 PM

## 2010-05-07 NOTE — Letter (Signed)
Summary: [facility]   Imported By: Maryln Gottron 04/27/2010 11:10:15  _____________________________________________________________________  External Attachment:    Type:   Image     Comment:   External Document

## 2010-05-07 NOTE — Letter (Signed)
Summary: Out of Work  [facility] [employer] [facility]  [address] [place], [place] [address]   Phone: [phone]  Fax: [phone]    April 16, 2010   Employee:  REMA LIEVANOS    To Whom It May Concern:   For Medical reasons, please excuse the above named employee from work for the following dates:  Start:   04/16/2010  End:   04/18/2010  If you need additional information, please feel free to contact our office.         Sincerely,    Ruthe Mannan MD

## 2010-05-21 ENCOUNTER — Telehealth: Payer: Self-pay | Admitting: Family Medicine

## 2010-05-21 NOTE — Letter (Signed)
Summary: Dr Ezzard Standing Note  Dr Ezzard Standing Note   Imported By: Kassie Mends 05/13/2010 09:30:42  _____________________________________________________________________  External Attachment:    Type:   Image     Comment:   External Document

## 2010-05-27 NOTE — Progress Notes (Signed)
Summary: ? knee  Phone Note Call from Patient Call back at Home Phone [phone] Call back at [phone]   Caller: Patient Call For: Ruthe Mannan MD Summary of Call: Patient calling says she has some inflammation in her knee and want to know what she can take or if anything can be called in for her Initial call taken by: Benny Lennert CMA Duncan Dull),  May 21, 2010 4:23 PM  Follow-up for Phone Call        [facility], PLease call her back.  I advised Alleve OTC when I talked to her last night.  Keep knee elevated.  If she needs something else, please let me know.  ALso, needs to be evaluated if she thinks she injured it or if it is getting worse. Ruthe Mannan MD  May 22, 2010 8:35 AM  Left message on cell phone voicemail advising patient as instructed.  Follow-up by: Linde Gillis CMA Duncan Dull),  May 22, 2010 9:18 AM

## 2010-06-03 ENCOUNTER — Encounter: Payer: Self-pay | Admitting: Family Medicine

## 2010-06-03 ENCOUNTER — Ambulatory Visit (INDEPENDENT_AMBULATORY_CARE_PROVIDER_SITE_OTHER): Payer: BC Managed Care – PPO | Admitting: Family Medicine

## 2010-06-03 DIAGNOSIS — M25569 Pain in unspecified knee: Secondary | ICD-10-CM

## 2010-06-03 DIAGNOSIS — M25469 Effusion, unspecified knee: Secondary | ICD-10-CM

## 2010-06-03 DIAGNOSIS — G8929 Other chronic pain: Secondary | ICD-10-CM | POA: Insufficient documentation

## 2010-06-11 NOTE — Assessment & Plan Note (Addendum)
Summary: PAIN IN KNEES/CLE   AETNA   Vital Signs:  Patient profile:   40 year old female Height:      69 inches Weight:      207.50 pounds BMI:     30.75 Temp:     98.4 degrees F oral Pulse rate:   60 / minute Pulse rhythm:   regular BP sitting:   124 / 80  (left arm) Cuff size:   large  Vitals Entered By: Benny Lennert CMA Duncan Dull) (June 03, 2010 4:08 PM)  History of Present Illness: Chief complaint bilateral knee pain  Pleasant lady, 40 year old, presents with several week history of B knee effusions. No known occult injury. Occurred after playing basketball for a long time (normally does not play), and worsened after going to a concert and dancing most of the night.   No mechanical locking up, no symptomatic giving way. Pain with flexion and anterior pain. No trauma.  Has been on Alleve for a few weeks.   R > L large effusions  recently played basketball, also recently was jumping all around at a concert  No FH of Rheum Dz. No prior knee surgery  Allergies (verified): No Known Drug Allergies  Past History:  Family History: Last updated: 06/02/2006 STRONG FHx for DM  Social History: Last updated: 07/16/2008 Married to [place], had son [name] 11/00, [name], and [place]; Works as Editor, commissioning; Never smoked, no alcohol  Risk Factors: Smoking Status: never (07/16/2008) Packs/Day: n/a (05/23/2007)  Past medical, surgical, family and social histories (including risk factors) reviewed, and no changes noted (except as noted below).  Past Medical History: Reviewed history from 06/02/2006 and no changes required. Anemia, acute blood loss in high school, h/o Glucose intolerance during pregnancy, h/o PIH (Severe, required MgSO4), Paronychia, toe 681.11, Postpartum endometritis  Past Surgical History: Reviewed history from 06/02/2006 and no changes required. LTCS - 02/04/1999, VBAC 1/06 - 05/19/2005  Family History: Reviewed history from 06/02/2006 and  no changes required. STRONG FHx for DM  Social History: Reviewed history from 07/16/2008 and no changes required. Married to [place], had son [name] 11/00, [name], and [place]; Works as Editor, commissioning; Never smoked, no alcohol  Review of Systems       REVIEW OF SYSTEMS  GEN: No systemic complaints, no fevers, chills, sweats, or other acute illnesses MSK: Detailed in the HPI GI: tolerating PO intake without difficulty Neuro: No numbness, parasthesias, or tingling associated. Otherwise the pertinent positives of the ROS are noted above.    Physical Exam  General:  GEN: Well-developed,well-nourished,in no acute distress; alert,appropriate and cooperative throughout examination HEENT: Normocephalic and atraumatic without obvious abnormalities. No apparent alopecia or balding. Ears, externally no deformities PULM: Breathing comfortably in no respiratory distress EXT: No clubbing, cyanosis, or edema PSYCH: Normally interactive. Cooperative during the interview. Pleasant. Friendly and conversant. Not anxious or depressed appearing. Normal, full affect.  Msk:  B KNEES Gait: Normal heel toe pattern ROM: full ext to 105 flexion Effusion: R large ballotable effusion, L moderate effusion Echymosis or edema: none Patellar tendon NT Painful PLICA: neg Patellar grind: negative Medial and lateral patellar facet loading: ttp medially B medial and lateral joint lines:NT Mcmurray's pos Flexion-pinch pos Varus and valgus stress: stable Lachman: neg Ant and Post drawer: neg Hip abduction, IR, ER: WNL Hip flexion str: 5/5 Hip abd: 5/5 Quad: 5/5 VMO atrophy:No Hamstring concentric and eccentric: 5/5    Impression & Recommendations:  Problem # 1:  KNEE PAIN, RIGHT (ICD-719.46) Assessment  New  Interesting case without occult injury mechanism. No Rheum dz hx, no concern for infection. Increased laxity at patellofemoral joint, notable pain at patellar facets without signs of  dislocation.  Etiology unclear, suspect may all be reactive from accelorated activity with increased PF laxity playing a role. Ligaments stable. Internal derangement possible.  For now, ice, activity mod, Voltaren. Large effusion on R recheck 1 mo  Knee Aspiration and Injection, R Patient verbally consented; risks, benefits, and alternatives explained. Patient prepped with betadine. Ethyl chloride for anesthesia. 10 cc of 1% Lidocaine used in wheal then injected Subcutaneous fashion with 27 gauge needle on lateral approach. Under sterilne conditions, 18 gauge needle used via lateral approach to aspirate 60 cc of clear, yellowish fluid. Then 9 cc of Marcaine 0.5% and 1 cc of Kenalog 40 mg injected. Tolerated well, decreased pain, no complications.   Her updated medication list for this problem includes:    Diclofenac Sodium 75 Mg Tbec (Diclofenac sodium) .Marland Kitchen... 1 by mouth daily. take with food  Orders: Joint Aspirate / Injection, Large (20610) Kenalog 10mg  (4units) (J3301)  Problem # 2:  KNEE PAIN, LEFT (ZOX-096.04) Assessment: New  Knee Injection, L Patient verbally consented to procedure. Risks, benefits, and alternatives explained. Sterilely prepped with betadine. Ethyl cholride used for anesthesia. 9 cc Lidocaine 1% mixed with 1 cc of Kenalog 40 mg injected using the anterolateral approach without difficulty. No complications with procedure and tolerated well. Patient had decreased pain post-injection.   Her updated medication list for this problem includes:    Diclofenac Sodium 75 Mg Tbec (Diclofenac sodium) .Marland Kitchen... 1 by mouth daily. take with food  Orders: Joint Aspirate / Injection, Large (20610) Kenalog 10mg  (4units) (J3301)  Problem # 3:  JOINT EFFUSION, KNEE (ICD-719.06) Assessment: New  Orders: Joint Aspirate / Injection, Large (20610) Kenalog 10mg  (4units) (J3301)  Complete Medication List: 1)  Diclofenac Sodium 75 Mg Tbec (Diclofenac sodium) .Marland Kitchen.. 1 by mouth daily. take  with food  Patient Instructions: 1)  f/u 1 month Prescriptions: DICLOFENAC SODIUM 75 MG TBEC (DICLOFENAC SODIUM) 1 by mouth daily. take with food  #60 x 0   Entered and Authorized by:   Hannah Beat MD   Signed by:   Hannah Beat MD on 06/03/2010   Method used:   Print then Give to Patient   RxID:   [id number]    Orders Added: 1)  Est. Patient Level IV [21308] 2)  Joint Aspirate / Injection, Large [20610] 3)  Kenalog 10mg  (4units) [J3301] 4)  Joint Aspirate / Injection, Large [20610] 5)  Kenalog 10mg  (4units) [J3301]    Prior Medications (reviewed today): None Current Allergies (reviewed today): No known allergies

## 2010-07-14 ENCOUNTER — Encounter: Payer: Self-pay | Admitting: Family Medicine

## 2010-07-16 ENCOUNTER — Ambulatory Visit (INDEPENDENT_AMBULATORY_CARE_PROVIDER_SITE_OTHER): Payer: BC Managed Care – PPO | Admitting: Family Medicine

## 2010-07-16 ENCOUNTER — Ambulatory Visit (INDEPENDENT_AMBULATORY_CARE_PROVIDER_SITE_OTHER)
Admission: RE | Admit: 2010-07-16 | Discharge: 2010-07-16 | Disposition: A | Discharge status: Home / Self Care | Payer: BC Managed Care – PPO | Source: Ambulatory Visit | Attending: Family Medicine | Admitting: Family Medicine

## 2010-07-16 ENCOUNTER — Encounter: Payer: Self-pay | Admitting: Family Medicine

## 2010-07-16 ENCOUNTER — Ambulatory Visit
Admission: RE | Admit: 2010-07-16 | Discharge: 2010-07-16 | Disposition: A | Discharge status: Home / Self Care | Payer: BC Managed Care – PPO | Source: Ambulatory Visit | Attending: Family Medicine | Admitting: Family Medicine

## 2010-07-16 DIAGNOSIS — M25469 Effusion, unspecified knee: Secondary | ICD-10-CM

## 2010-07-16 DIAGNOSIS — M25569 Pain in unspecified knee: Secondary | ICD-10-CM

## 2010-07-16 NOTE — Progress Notes (Signed)
40 year old female:  F/u R > L knee pain.  30-35 % better Has been exercising more with a group from work Has been running, they will help some more.   After exercising the first few weeks  The patient lost about 7 pounds. She has had a return of effusion on the right knee, with the right knee being significantly greater than the left. Does have some patellar crepitus and pain with mostly going downstairs as opposed to going upstairs. She has had some pain with rising from a seated position.  Overall, she feels better compared to previously, and has been working on her quadricep strengthening. She also started running and has been running up to 30 mins at a time.  The PMH, PSH, Social History, Family History, Medications, and allergies have been reviewed in [facility], and have been updated if relevant.  GEN: Well-developed,well-nourished,in no acute distress; alert,appropriate and cooperative throughout examination HEENT: Normocephalic and atraumatic without obvious abnormalities. Ears, externally no deformities PULM: Breathing comfortably in no respiratory distress EXT: No clubbing, cyanosis, or edema PSYCH: Normally interactive. Cooperative during the interview. Pleasant. Friendly and conversant. Not anxious or depressed appearing. Normal, full affect.  Gait: Normal heel toe pattern ROM: WNL Effusion: Mod R, Mild L Echymosis or edema: none Patellar tendon NT Painful PLICA: neg Patellar grind: POS, R > L Medial and lateral patellar facet loading: pos medial and lateral joint lines:NT Mcmurray's neg Flexion-pinch neg Varus and valgus stress: stable Lachman: neg Ant and Post drawer: neg Hip abduction, IR, ER: WNL Hip flexion str: 5/5 Hip abd: 5/5 Quad: 5/5 VMO atrophy:No Hamstring concentric and eccentric: 5/5  A/P: Bilateral joint pain and bilateral effusions. The patient is significantly better and she has dramatic patellar crepitus and a positive grind. Think most consistent  with very significant patellofemoral pain. The effusions are unusual. I am going to check bilateral weight bearing knee x-rays, and will also obtain basic rheumatological panels.

## 2010-07-16 NOTE — Patient Instructions (Signed)
KEEP UP THE GOOD WORK  If your knees get a lot worse or with worse pain, let me know

## 2010-07-17 LAB — ANTI-NUCLEAR AB-TITER (ANA TITER): ANA Titer 1: 1:80 {titer} — ABNORMAL HIGH

## 2010-08-21 NOTE — Op Note (Signed)
NAMEPATINA, SPANIER           ACCOUNT NO.:  [id number]   MEDICAL RECORD NO.:  [id number]          PATIENT TYPE:  INP   LOCATION:  1311                         FACILITY:  [facility]   PHYSICIAN:  Clovis Pu. Cornett, M.D.DATE OF BIRTH:  06/06/1970   DATE OF PROCEDURE:  10/04/2005  DATE OF DISCHARGE:                                 OPERATIVE REPORT   PREOPERATIVE DIAGNOSIS:  Gallstone pancreatitis.   POSTOP DIAGNOSES:  1.  Gallstone pancreatitis.  2.  Common bile duct stone.  3.  Cholelithiasis with acute cholecystitis.   PROCEDURE:  1.  Laparoscopic cholecystectomy with intraoperative cholangiogram.  2.  Laparoscopic transcystic common bile duct exploration using a Reddick      catheter.   ANESTHESIA:  General endotracheal anesthesia with 20 mL of 0.25% Sensorcaine  local.   SURGEON:  Thomas A. Cornett, M.D.   ANESTHESIA:  General tracheal anesthesia with 20 mL of 0.25 Sensorcaine  local.   ASSISTANT:  Alfonse Ras, MD   ESTIMATED BLOOD LOSS:  20 mL.   SPECIMEN:  Gallbladder to pathology.   DRAINS:  None.   INDICATIONS FOR PROCEDURE:  The patient is 40 year old female who was  admitted to the medical service for the weekend with pancreatitis.  Workup  revealed gallstones and findings consistent with gallstone pancreatitis.  She was seen by Dr. Orson Slick over the weekend; and their amylase/lipase and  liver function studies have all returned to normal; and she is now ready for  laparoscopic cholecystectomy.  I met the patient,  discussed her care with  her; and she agreed to proceed with surgery with me as her surgeon.  Informed consent was obtained after discussing the procedure with the  patient as well as the potential complications, and other potential  therapies need for this problem.   DESCRIPTION OF PROCEDURE:  The patient was brought to the operating suite  and placed supine.  After induction of general endotracheal anesthesia the  abdomen was prepped and  draped in a sterile fashion.  Then 0.25% Sensorcaine  was used for local anesthesia; and this was injected in the supraumbilical  area.  A 1-cm incision was made just above the umbilicus.  Dissection was  carried down to her fascia.  A small incision was made in her fascia and I  grasped both ends of the fascia with Kochers.  I extended my incision into  the preperitoneal space.  A small hemostat was used to open the peritoneum.  A pursestring suture of #0 Vicryl was then placed; and a 12-mm Hassan  cannula was placed under direct vision.  A pneumoperitoneum was created to  15 mmHg and a CO2 laparoscope was then placed.   The patient was placed in reverse Trendelenburg and rolled to her left.  Laparoscopy was performed.  No evidence of solid-or-hollow organ injury with  insertion of this port.  The gallbladder was identified, and had signs of  acute cholecystitis.  An 11-mm subxiphoid port was placed under direct  vision using local anesthesia.  Two 5-mm ports were placed in the right  abdomen, under direct vision, with local anesthesia.  The dome  of the  gallbladder was grasped and retracted toward the patient's right shoulder.  A second grasper was used to grab the infundibulum of the gallbladder and  pull it towards the patient's right lower quadrant.  There were some  adhesions to the duodenum; and they were swept away gently, using blunt  dissection.  No injury to the duodenum was noted after that.  I was then  able to dissect the peritoneum at the junction of cystic duct and  peritoneum, and open up the area behind the cystic duct circumferentially.  This was the only tubular structure entering the gallbladder.  It was mildly  dilated, though, on gross inspection.   A clip was placed on the gallbladder side; and an incision was made in the  cystic duct.  Through a separate stab incision a Cook cholangiogram catheter  was introduced and placed in the cystic duct and placed with a  clip; 1/2  strength Hypaque dye was used.  An intraoperative cholangiogram with  fluoroscopy was then performed.  There was free flow of contrast; and a very  dilated tortuous cystic duct up into a dilated common duct to the  bifurcation of right-and-left hepatic duct.  Of note, in the left hepatic  duct, There was a small branch that came off which appeared to have a small  stone in time.  There is no free flow of contrast into the duodenum; and a  meniscus sign was noted at the common duct insertion in the ampule of Vater.  Of note, there was contrast flowing into a mildly dilated pancreatic duct.  At this point in time, given the large size of her cystic duct.  We  attempted to pass a Reddick catheter, performing a transcystic common duct  exploration.  We removed the [facility] cholangiogram catheter; and was able to  pass a Reddick down, quite easily, through the cystic duct; through the  common duct; and into the duodenum.  I then used fluoroscopy, again, and  shot contrast through the catheter to verify the balloon area of Reddick  catheter was in the duodenum.  Once I did this, I blew up the balloon, and  pulled it back and felt resistance once I hit the sphincter.  I let a little  bit of the contrast out of the balloon and was able to pull it through the  sphincter dislodging the stone.  I then pulled out the catheter, pulled the  catheter back into the cystic duct, and blew up the balloon, again, under  direct vision so that I could see the cystic duct quite well, and repeated  cholangiogram.   There was free flow of contrast passed this stone, but I noted that the  stone with the contrast was blown up into the common hepatic duct.  I then  repeated the cholangiogram a second time and, again, noted that the stone  had gone back down to obstruct the outflow of the common bile duct.  At this point in time I did not feel I could do much else laparoscopically and felt  that a  sphincterotomy would be helpful to her, at some point, to help  relieve her obstruction.  Liver function studies were not elevated at this  point.  Amylase and lipase had returned back to normal.  At this point in  time there is also an opacity noted involving the left hepatic duct with a  filling defect, which is very difficult, even after manipulating the tube  with  fluoroscopy.  At this point, in time I elected to stop. I did not see  any extravasation of contrast to indicate an injury to her common duct with  manipulation using the Reddick catheter.  The catheter was subsequently  removed and I triple clipped the cystic duct and divided it.  The cystic  artery was identified, triple clipped and divided.  I also put a clip across  the very top of the cystic node and then divided above it with cautery.  Cautery was then used to remove the gallbladder from gallbladder fossa  without difficulty.   The gallbladder was then placed in an EndoCatch bag and removed through the  umbilicus under direct vision.  The gallbladder bed was inspected; and the  clips were on cystic artery and cystic duct respectively.  Irrigation was  used, suctioned out with good hemostasis.  We suctioned all excess  irrigation.  Ports were removed without signs of port site bleeding.  Prior  to removal of the ports, I did not see any evidence of injury to hollow  organ or solid organ.  At this point in time, once the reports were removed,  the camera was withdrawn at the umbilicus.  The umbilical port  was removed; and the CO2 was released.  A pursestring suture was placed  around the umbilical fascia.  Then 4-0 Monocryl was used to close the skin.  All sponge, needle, and instrument counts were found to be correct in this  portion of the case.  The patient was awoke; taken to recovery in  satisfactory condition.      Thomas A. Cornett, M.D.  Electronically Signed     TAC/MEDQ  D:  10/04/2005  T:  10/04/2005   Job:  [id number]

## 2010-08-21 NOTE — Op Note (Signed)
Barbara [name], Barbara [name]           ACCOUNT NO.:  [id number]   MEDICAL RECORD NO.:  [id number]          PATIENT TYPE:  INP   LOCATION:  1606                         FACILITY:  [facility]   PHYSICIAN:  Petra Kuba, M.D.    DATE OF BIRTH:  May 25, 1970   DATE OF PROCEDURE:  10/11/2005  DATE OF DISCHARGE:                                 OPERATIVE REPORT   PROCEDURE:  ERCP, sphincterotomy, balloon pull-through.   INDICATIONS:  Questionable CBD stone probably seen on intraoperative  cholangiogram but with bout of gallstone pancreatitis, resolving may have  passed it.  Consent signed after risks, benefits, methods, options  thoroughly discussed by Dr. Madilyn Fireman multiple times and by me prior to any  medications.   SEDATION:  Fentanyl 225 mcg, Versed 22 mg.   PROCEDURE:  The side-viewing therapeutic video duodenoscope was inserted by  indirect vision and advanced through a normal antrum, normal pylorus into  the duodenum and a normal-appearing ampulla was brought into view.  Unfortunately I rand into the same problems that  Dr. Madilyn Fireman had throughout  this procedure difficulty getting had adequate position and when he did, he  fell back in the stomach frequently.  The PD was cannulated few times we did  roll her from her stomach slightly to her left side and then we obtained  cannulation completely on the left side.  We also probed the sphincterotome  with the wire and were able to finally cannulate into the tapered smaller  sphincterotome compared to the standard and we were able to get deep  selective cannulation using the smaller Jag wire we did not advance the wire  at all into the pancreas but did have one moderate submucosal injection.  We  went ahead and proceeded with a small sphincterotomy which was difficult  based on the difficult position as well as the submucosal injection where  the landmarks with difficult to ascertain.  We did pulled back in the  stomach a few times with this  positioning, but were able to recannulate the  sphincterotome wire readily and from this junction on.  We went ahead and  exchanged the larger sphincterotome for the tapered sphincterotome over the  wire and then exchanged wires so we had our larger Jag wire deep in the  intrahepatics.  We increased the sphincterotomy site.  We had adequate  biliary drainage but could not get the fully bowed sphincterotome in and out  of the duct.  There was a little bit of heme at this junction and did not  feel comfortable cutting any further.  Again landmarks were difficult to  ascertain completely based on the submucosal injection and difficult  position.  We went ahead and exchanged the adjustable balloon over the wire  and proceeded with a 12 balloon pull-through which would not readily come  through the ampulla but dropping it to the 9, it readily fell out of the  ampulla.  Unfortunately fell back into the stomach at this juncture and she  was waking up and based on the length oif the procedure, amount of medicine  given her, we elected to stop  the procedure at this juncture, since no  residual stone was seen on any of the fluoro pictures and fluoro during the  procedure.  Scope was removed.  The patient tolerated the procedure well.  There was no obvious immediate significant complication.  There was a little  heme from the sphincterotomy site and a submucosal injection done early on  in the procedure.   ENDOSCOPIC DIAGNOSES:  1.  Normal ampulla, status post submucosal junction.  2.  Normal PD, a few injections only, not overfilled.  3.  Dilated CBD no obvious stone seen, minimal intrahepatic filling, status      post small to medium size sphincterotomy and one balloon pull-through      using a 12.2 adjustable 9 balloon.  4.  Minimal heme, seemingly adequate drainage at the end of procedure.   PLAN:  Observe for delayed complications.  Have reviewed the procedure with  the husband.  There is a  small chance that there was still a stone present  since we could not pull the balloon through as many times as we would have  liked but with her sphincterotomy being done, the procedure should go much  smoother in the future, I would probably start with her on left side which  seemed to have a better position.  No aspirin or nonsteroidals 2 weeks and  if no delayed complications, recheck labs in the morning and hopefully go  home tomorrow.           ______________________________  Petra Kuba, M.D.     MEM/MEDQ  D:  10/11/2005  T:  10/11/2005  Job:  [id number]   cc:   Everardo All. Madilyn Fireman, M.D.  Fax: [phone]   Clovis Pu. Cornett, M.D.  [address] [place] [address]  [place] [address]

## 2010-08-21 NOTE — Consult Note (Signed)
Barbara [name], TAMM           ACCOUNT NO.:  [id number]   MEDICAL RECORD NO.:  [id number]          PATIENT TYPE:  INP   LOCATION:  1311                         FACILITY[facility]  [facility]   PHYSICIAN:  John C. Madilyn Fireman, M.D.    DATE OF BIRTH:  1970-10-12   DATE OF CONSULTATION:  10/04/2005  DATE OF DISCHARGE:  04/09/2005                                   CONSULTATION   REASON FOR CONSULTATION:  Question of common bile duct stone on  intraoperative cholangiogram.   HISTORY OF PRESENT ILLNESS:  The patient is a 40 year old black female  admitted with acute gallstone pancreatitis yesterday.  She had markedly  elevated lipase of 6800 with elevated liver function tests with bilirubin of  2.9 and alkaline phosphatase 101, AST 129, ALT 99.  Today all these numbers  are markedly improved with lipase 195, ALT 49, AST 33, alkaline phosphatase  67.  Her ultrasound revealed diffuse gallbladder wall thickening with  multiple gallstones and no ductal dilatation.  She underwent intraoperative  cholangiogram today and it is felt that there may be a stone in the left  hepatic duct.  She is currently in the recovery room and fairly drowsy.   PAST MEDICAL HISTORY:  Essentially unremarkable.   SURGERIES:  Benign breast mass excised 1985.   FAMILY HISTORY:  Noncontributory.   MEDICATIONS:  Omeprazole and promethazine.   ALLERGIES:  None known.   PHYSICAL EXAMINATION:  Limited in postoperative recovery room at this point.  ABDOMEN:  Not done due to her being immediate postoperative period.  She is  semi alert and having mild abdominal pain.   IMPRESSION:  Cholecystitis with gallstone pancreatitis and question of a  common bile duct stone.   PLAN:  Will review films with radiology and surgeon and determine whether  she needs post procedure ERCP.           ______________________________  Everardo All. Madilyn Fireman, M.D.     JCH/MEDQ  D:  10/04/2005  T:  10/04/2005  Job:  [id number]   cc:   Thomas A. Cornett, M.D.  [address] [place] [address]  [place] [address]

## 2010-08-21 NOTE — Consult Note (Signed)
Barbara [name], Barbara [name]           ACCOUNT NO.:  [id number]   MEDICAL RECORD NO.:  [id number]          PATIENT TYPE:  INP   LOCATION:  1606                         FACILITY:  [facility]   PHYSICIAN:  Graylin Shiver, M.D.   DATE OF BIRTH:  08-24-1970   DATE OF CONSULTATION:  DATE OF DISCHARGE:                                   CONSULTATION   DATE OF CONSULTATION:  October 09, 2005.   REASON FOR CONSULTATION:  The patient is a 40 year old black female, who is  status post laparoscopic cholecystectomy six days ago with findings on  intraoperative cholangiogram of a common bile duct stone.  The patient had  an attempted ERCP the following day, but there was failure to cannulate the  common bile duct and to remove the common bile duct stone.  The patient was  asymptomatic and was sent home.  She was readmitted today, however, with  complaints of abdominal pain, nausea, and vomiting.  She was found to have a  lipase of 1500, bilirubin 4, and the alkaline-phosphatase 233.  Her AST was  370, ALT 369.  In view of this, she was readmitted to the hospital.   At the present time, she is feeling better, she is receiving supportive care  with IV fluids and analgesics as necessary.   The patient was going to have a repeat ERCP attempted on Monday, October 11, 2005, by Dr. Vida Rigger at [facility] at 7:30 a.m.   PHYSICAL EXAMINATION:  GENERAL:  She does not appear in any acute distress  at this time.  NECK:  Supple.  HEART:  Regular rhythm.  LUNGS:  Clear.  ABDOMEN:  Bowel sounds are normal.  Abdomen is soft.  There is some mild  tenderness in the epigastrium.   IMPRESSION:  Gallstone pancreatitis.   PLAN:  Provide supportive care with IV fluids and analgesics as needed at  this time.  ERCP is to be reattempted Monday.           ______________________________  Graylin Shiver, M.D.     SFG/MEDQ  D:  10/09/2005  T:  10/09/2005  Job:  [id number]   cc:   Everardo All. Madilyn Fireman, M.D.  Fax: [phone]

## 2010-08-21 NOTE — Consult Note (Signed)
NAMEMCKINNLEY, COTTIER           ACCOUNT NO.:  [id number]   MEDICAL RECORD NO.:  [id number]          PATIENT TYPE:  INP   LOCATION:  1311                         FACILITY:  [facility]   PHYSICIAN:  Lebron Conners, M.D.   DATE OF BIRTH:  May 03, 1970   DATE OF CONSULTATION:  10/03/2005  DATE OF DISCHARGE:                                   CONSULTATION   SURGICAL CONSULTATION   CHIEF COMPLAINT:  Pancreatitis.   PRESENT ILLNESS:  Ms. Grandmaison is a generally healthy 40 year old black  female who is admitted with pancreatitis and gallstones.  She had recently  been treated for what was thought to be peptic ulcer disease because of H.  pylori positivity and she took antibiotics omeprazole, but still had pain.  She was seen again and found to have a markedly elevated amylase and lipase,  and some elevation of her liver tests and has had an ultrasound showing  gallstones.  She is getting better in the hospital with bowel rest, pain  medicine and antibiotics.   PAST MEDICAL HISTORY:  Generally in good health.  She has not had any  serious operations except for cesarean section.  She had a breast mass  excised in 1985.  No chronic GI problems except for the present illness.  The patient does not smoke.  She does not drink alcohol beverages.  She  recently had a baby.   FAMILY HISTORY:  Child illnesses unremarkable.   REVIEW OF SYSTEMS:  A 15-point review of systems unremarkable   PHYSICAL EXAMINATION:  GENERAL:  No acute distress.  VITAL SIGNS:  Temperature normal as soon recorded by nursing staff.  HEENT:  The head, neck, eyes, ears, nose and throat are unremarkable.  The  sclera slight tinge of yellow.  NECK:  No neck mass or thyroid enlargement.  CHEST:  Clear to auscultation.  HEART:  Regular rhythm normal.  No murmur or gallop.  ABDOMEN:  Very slight epigastric and left upper quadrant tenderness.  No  hernia.  No mass and no enlargement of organs.  EXTREMITIES:  No edema.  No noted  loss of pulses or skin lesions.  NEUROLOGICAL:  Grossly normal.   IMPRESSION:  Gallstone pancreatitis, improving.   PLAN:  Continued observation and close follow up.  I think she may be ready  to have a laparoscopic cholecystectomy tomorrow unless her liver tests are  not improved and/or her amylase remains markedly elevated.  I will  reevaluate her in the morning.  I have discussed laparoscopic  cholecystectomy with her and her husband.  Day agree with the plan that I  suggest.      Lebron Conners, M.D.  Electronically Signed     WB/MEDQ  D:  10/03/2005  T:  10/04/2005  Job:  [id number]   cc:   Isidor Holts, M.D.

## 2010-08-21 NOTE — Discharge Summary (Signed)
NAMEJONAH, NESTLE           ACCOUNT NO.:  [id number]   MEDICAL RECORD NO.:  [id number]          PATIENT TYPE:  INP   LOCATION:  1606                         FACILITY:  [facility]   PHYSICIAN:  Clovis Pu. 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   PROCEDURE:  ERCP, Dr. Petra Kuba.   HISTORY:  The patient is a 40 year old female one week status post  laparoscopic cholecystectomy.  She was found to have common duct stones and  underwent an initial ERCP last week, which was unsuccessful.  She was doing  well on discharge.  She went home last Wednesday.  She returned to the  hospital on Saturday morning with upper abdominal pain, nausea and vomiting.  She was found to have a bump in her amylase, lipase and liver function  studies.  She is readmitted with a diagnosis of choledocholithiasis and  recurrent pancreatitis.   HOSPITAL COURSE:  The patient was admitted and placed on analgesics.  She  did well.  Actually her liver function improved on Sunday.  On Monday she  underwent a repeat ERCP and sphincterotomy by Dr. Ewing Schlein.  No stones were  seen.  Her liver functions returned to almost normal and her amylase and  lipase had come way down.  She was advanced on her diet.  Tolerated a  regular diet without difficulty and was discharged home on October 12, 2005, in  satisfactory condition.   DISCHARGE INSTRUCTIONS:  She will see me in two weeks.   DIET:  I have placed her on a low-fat diet.   DISCHARGE MEDICATIONS:  She will resume her preoperative medications as  before.   ACTIVITY:  I have asked her to go ahead and increase her activity and to  shower at this point in time.   CONDITION ON DISCHARGE:  She was discharged  in satisfactory condition.      Thomas A. Cornett, M.D.  Electronically Signed     TAC/MEDQ  D:  10/12/2005  T:  10/12/2005  Job:  [id number]   cc:   Petra Kuba, M.D.  Fax: [phone]

## 2010-08-21 NOTE — H&P (Signed)
Barbara [name], [name]           ACCOUNT NO.:  [id number]   MEDICAL RECORD NO.:  [id number]          PATIENT TYPE:  EMS   LOCATION:  ED                           FACILITY:  [facility]   PHYSICIAN:  Clovis Pu. Cornett, M.D.DATE OF BIRTH:  1970-12-05   DATE OF ADMISSION:  10/09/2005  DATE OF DISCHARGE:                                HISTORY & PHYSICAL   CHIEF COMPLAINT:  Abdominal pain.   HISTORY OF PRESENT ILLNESS:  The patient is a 40 year old female who was  discharged on Wednesday of last week after undergoing a laparoscopic  cholecystectomy on Monday for gallstone pancreatitis. On Monday, she was  found to have a common duct stone and underwent ERCP on Tuesday but this was  unsuccessful by Dr. Dorena Cookey. Wednesday she felt well, her liver functions  had returned to normal, her amylase and lipase were normal and she was  tolerating a diet. She was scheduled for repeat ERCP on Monday and was  discharged home Wednesday since she was asymptomatic. On Thursday, she began  to develop epigastric discomfort with nausea and vomiting after eating a  [employer] sandwich and some beef stew. On Friday, she had a grilled cheese and  this exacerbated her symptoms as well. I spoke with her this morning because  she had more abdominal pain with nausea and vomiting. I asked her to come to  the emergency room. She was evaluated here and found to have a lipase of  well over 1500 with elevation of her liver function studies with a total  bilirubin of 4.0, AST and ALT were elevated in the 300s. Alk phos was 288.  At this point in time, she appears to have pancreatitis and  choledocholithiasis with an elevation in her liver function studies.  Currently, she is complaining of epigastric discomfort nausea and vomiting.  She has had no fever and no chills. The pain does not radiate and is severe  on a scale of 8-10/10. She also has nausea.   PAST MEDICAL HISTORY:  As listed above.   PAST SURGICAL HISTORY:  As  listed above.   SOCIAL HISTORY:  She is married with children. She has a baby who is 62  months old at home. She is breast feeding. Denies tobacco or alcohol use.   FAMILY HISTORY:  Noncontributory.   MEDICATIONS:  Promethazine for nausea and omeprazole, also Vicodin for pain.   ALLERGIES:  None.   REVIEW OF SYSTEMS:  As stated above otherwise 10-point review of systems  negative.   PHYSICAL EXAMINATION:  VITAL SIGNS:  Temperature is 97.5, pulse 94, blood  pressure 108/54.  GENERAL:  Pleasant female in no apparent distress.  HEENT:  Extraocular movements intact. There is no evidence of scleral  icterus, oropharynx is moist. Normocephalic atraumatic.  NECK:  Supple, nontender. No thyromegaly, no JVD, no mass.  CHEST:  Chest wall motion is normal.  LUNGS:  Sounds are clear to auscultation bilaterally.  CARDIOVASCULAR:  Regular rate and rhythm without murmurs, rubs or gallops.  Peripheral pulses are present and feet are warm.  ABDOMEN:  There is no rebound, no guarding. Wounds look good  from surgery.  There is mild epigastric discomfort to palpation. No lower abdominal  discomfort. No peritoneal signs.  EXTREMITIES:  Muscle tone is normal, range of motion is normal.   DIAGNOSTIC STUDIES:  White count of 6700, hemoglobin 12, platelet count is  407,000. Urinalysis shows high levels of bilirubin otherwise normal. Sodium  137, potassium 3.7, chloride 100, CO2 25, glucose 130, BUN 7, creatinine  0.8. AST is 370, ALT is 369, alkaline phosphatase is 233, bilirubin total is  4. Lipase is 1568.   IMPRESSION:  1.  Recurrent pancreatitis.  2.  Choledocholithiasis.   PLAN:  Barbara [name] was scheduled for repeat endoscopic retrograde  cholangiopancreatography on Monday by Dr. Leary Roca. We will give [facility]  a call today to let them know she is in the hospital. She will be readmitted  for IV fluids, IV antibiotics and made n.p.o. for now. We will follow her  pancreatic enzyme studies as  well as her liver function studies. She will be  provided with analgesia and anti-nausea medicine for now and I will obtain a  CT scan to exclude a biloma and to get a better look at her pancreas to make  sure there is no other issues with her pancreas. Her white count is normal,  her abdomen is relatively benign on exam and otherwise she is stable. I have  discussed the plan of care with her.      Thomas A. Cornett, M.D.  Electronically Signed     TAC/MEDQ  D:  10/09/2005  T:  10/09/2005  Job:  [id number]

## 2010-08-21 NOTE — Discharge Summary (Signed)
Barbara [name], Barbara [name]           ACCOUNT NO.:  [id number]   MEDICAL RECORD NO.:  [id number]          PATIENT TYPE:  INP   LOCATION:  1311                         [facility]ACILITY:  [facility]   PHYSICIAN:  Hettie Holstein, D.O.    DATE OF BIRTH:  03/19/1971   DATE OF ADMISSION:  10/02/2005  DATE OF DISCHARGE:  10/06/2005                                 DISCHARGE SUMMARY   PRIMARY CARE PHYSICIAN:  [facility].   REASON FOR ADMISSION:  Abdominal pain.   PRINCIPAL DIAGNOSIS:  Gallstone pancreatitis.   SECONDARY DIAGNOSIS:  Retained common bile duct stone.   INITIAL DIAGNOSES:  1.  Status post treated Helicobacter pylori infection.  2.  Hypokalemia.   CONSULTATIONS THIS ADMISSION:  1.  Lebron Conners, M.D. of general surgery.  2.  Clovis Pu. Cornett, M.D. of general surgery.  3.  John C. Madilyn Fireman, M.D. of gastroenterology.   PROCEDURES THIS ADMISSION:  The patient underwent laparoscopic  cholecystectomy with intraoperative cholangiogram by Dr. Luisa Hart on October 04, 2005.  She underwent attempted ERCP and extraction of common bile duct  stone, unsuccessful, but Dr. Madilyn Fireman, currently awaiting scheduling for  outpatient re-attempt by Dr. Ewing Schlein.   MEDICATIONS ON DISCHARGE:  The patient is instructed to resume, as before,  her:  1.  Promethazine.  2.  Omeprazole.  3.  Pepcid.  4.  Tylenol.  In addition, she was given a prescription of:  1.  Vicodin by her surgeon.  2.  K-Lor 20 mEq daily x1 week.   FOLLOWUP:  She was instructed to follow up with her primary care physician  within 1-2 weeks of discharge for hospital follow up.   DISPOSITION:  She is instructed that Dr. Madilyn Fireman will call her on October 07, 2005  to direct her in regards to further GI evaluation and follow up.   HISTORY OF PRESENTING ILLNESS:  For full details, please refer to the H&P as  dictated by Dr. Hadley Pen.  However, briefly, [name] is a 40 year old  female who presented with abdominal pain and vomiting.  Her  abdominal pain  was generalized.  She was treated at [facility] in  May of 2007.  She was found to have an H. pylori infection and took a  treatment course.  In any event, in the emergency department she was  discovered to have acute pancreatitis and was subsequently admitted.   HOSPITAL COURSE:  The patient was evaluated.  She was discovered to have  gallstone pancreatitis.  General surgery was consulted, and she subsequently  underwent cholecystectomy.  A common bile duct stone was found and unable to  be extracted during this hospitalization hospital course.  She is being  discharged for outpatient re-attempt.   LABORATORY DATA ON DISCHARGE:  Sodium of 136, potassium 3.2, BUN 3,  creatinine 0.7, glucose 86, AST of 35, ALT of 49, albumin 0.9.      Hettie Holstein, D.O.  Electronically Signed     ESS/MEDQ  D:  10/06/2005  T:  10/06/2005  Job:  [id number]   cc:   Petra Kuba, M.D.  Fax: [phone]   Lebron Conners, M.D.  [address]  [place]  [place] [address]

## 2010-08-21 NOTE — Op Note (Signed)
[name], Barbara [name]           ACCOUNT NO.:  [id number]   MEDICAL RECORD NO.:  [id number]           PATIENT TYPE:   LOCATION:                                 FACILITY:   PHYSICIAN:  John C. Madilyn Fireman, M.D.         DATE OF BIRTH:   DATE OF PROCEDURE:  10/05/2005  DATE OF DISCHARGE:                                 OPERATIVE REPORT   PROCEDURE:  Endoscopic retrograde cholangiopancreatography.   INDICATIONS FOR PROCEDURE:  Common bile ducts bile duct stones seen on  intraoperative cholangiogram.   DESCRIPTION OF PROCEDURE:  The patient was placed in the left lateral  decubitus position and placed on the pulse monitor with continuous low-flow  oxygen delivered by nasal cannula.  She was placed in the prone position and  placed on the pulse monitor with continuous low-flow oxygen delivered by  nasal cannula.  She was sedated at 120 mcg IV fentanyl and 12 mg IV Versed.  The Olympus side-viewing endoscope was advanced blindly into the oropharynx,  esophagus, and stomach.  The pylorus was traversed and the papilla of Vater  located on the medial duodenal wall.  It had a somewhat bulging downward,  oriented appearance, and also there was difficulty maintaining an adequate  lineup on the papilla, either in the long-or-short scope position.  This led  to multiple difficult approaches, that often led into the scope flopping  back into the stomach.  I was able to obtain a pancreatogram on 2 or 3  occasions.  I tried turning her partially on her left side; and then  completely on the left side, and made several other attempts; but I was  never able to selectively cannulate a common bile duct spite approximately 1  hour of attempting this.  The protruding papilla was difficult to apply  direct pressure to with the cannula, tending to bend off to the right or  left, adding to the difficulty.  Ultimately, I was unable to obtain a  cholangiogram.   The scope was then withdrawn, and the patient  returned to the recovery room  in stable condition.  She tolerated the procedure well; and there were no  immediate complications.   IMPRESSION:  1.  Unsuccessful attempt at obtaining a cholangiogram.  2.  Normal pancreatogram.   PLAN:  Will consider a repeat attempt by one of my partners, or possibly  referring her to a tertiary care center for repeat attempt.           ______________________________  Everardo All. Madilyn Fireman, M.D.     JCH/MEDQ  D:  10/05/2005  T:  10/05/2005  Job:  [id number]   cc:   Thomas A. Cornett, M.D.  [address] [place] [address]  [place] [address]

## 2010-08-21 NOTE — H&P (Signed)
Barbara [name], Barbara [name]           ACCOUNT NO.:  [id number]   MEDICAL RECORD NO.:  [id number]          PATIENT TYPE:  INP   LOCATION:  1311                         FACILITY:  [facility]   PHYSICIAN:  Theresia Bough, MD       DATE OF BIRTH:  16-Jul-1970   DATE OF ADMISSION:  10/02/2005  DATE OF DISCHARGE:                                HISTORY & PHYSICAL   PRIMARY CARE PHYSICIAN:  [facility].   PRESENTING COMPLAINT:  Abdominal pain.   HISTORY OF PRESENT ILLNESS:  This is a 40 year old black female patient who  came into the hospital because of abdominal pain.  She has also vomited  once.  Abdominal pain is generalized.  There is no diarrhea.  Patient had  abdominal pain in May, 2007, and was treated at [facility].  At that time she was found to have H. pylori, she was found to have H.  pylori infection and she took Amoxil, omeprazole and probably Biaxin at that  time.  She developed abdominal pain again yesterday and the pain has been  getting worse.  She denies headache, no blurring of vision, no chest pain,  no cough, no wheezing.  She denies any dysuria.  There is no joint pain,  there are no feet swelling.   PAST MEDICAL HISTORY:  Includes the recent H. pylori which was treated.   SOCIAL HISTORY:  She lives with her husband.  She does not smoke cigarettes.  She does not drink alcohol.   SURGICAL HISTORY:  She has history of C-section 6 years ago.  She also has a  history of breast mass excision in 1985.   CURRENT MEDICATIONS:  Include:  1.  Omeprazole 20 mg daily.  2.  Promethazine 25 mg three times daily as needed for nausea or vomiting.   SHE DENIES ANY DRUG ALLERGIES.   FAMILY HISTORY:  Is noncontributory at this time.   PHYSICAL EXAMINATION:  This is a young lady who is lying in bed with a  temperature of 98.4, blood pressure of 107/70, pulse rate of 76,  respirations of 18.  HEAD AND NECK:  Shows pink conjunctiva, she has no jaundice, neck  is supple.  CHEST:  Clear to auscultation bilateral.  CARDIOVASCULAR:  Shows first and second heart sounds __________ normal.  There is no murmur and there is no gallop.  ABDOMEN:  Is soft and flat.  There is no distention.  Patient has diffuse  tenderness in all of the quadrants.  There is no rebound tenderness.  Patient does make bowel sounds.  EXTREMITY:  Shows no tenderness, normal range of motion in the joint.  There  is no edema.  NEUROLOGICAL SYSTEM:  Patient is alert and oriented to time, place and  person and there is no focal neurologic deficits.   Blood work shows a slightly increased WBC of 12.1, hemoglobin 14.3,  hematocrit 42.6 and platelets of 306.  Basic chemistry shows sodium of 139,  potassium 4.5, chloride of 101, bicarbonate of 28, BUN of 10, creatinine of  1 and glucose of 152.  Patient denies any history  of diabetes.  She is  currently on D5 normal saline.  That is probably why the glucose is slightly  elevated.  Total protein is 7.6, the albumin is 4.1, AST is 129, is  elevated, ALT is 99, which is elevated.  Total bilirubin is 2.9.  Urinalysis  is negative for any infection.  Lipase level is 6800, which is elevated.   ASSESSMENT:  Acute pancreatitis.   My plan is to admit patient to medical floor.  I will place her on n.p.o.  I  will continue D5 normal saline at 100 mL/hr.  I will __________ fluid intake  and urine output.  I will give IV Dilaudid 2-4 mg q.4h. p.r.n. for pain  control.  I will also do a right upper quadrant ultrasound to diagnose  gallstones.  Since patient does not drink alcohol, I am thinking that she  may be having gallstone pancreatitis.      Theresia Bough, MD     GA/MEDQ  D:  10/02/2005  T:  10/03/2005  Job:  [id number]

## 2010-12-28 LAB — POCT URINALYSIS DIP (DEVICE)
Bilirubin Urine: NEGATIVE
Hgb urine dipstick: NEGATIVE
Hgb urine dipstick: NEGATIVE
Nitrite: NEGATIVE
Nitrite: NEGATIVE
Urobilinogen, UA: 0.2
pH: 6
pH: 6

## 2010-12-29 LAB — POCT URINALYSIS DIP (DEVICE)
Bilirubin Urine: NEGATIVE
Bilirubin Urine: NEGATIVE
Hgb urine dipstick: NEGATIVE
Hgb urine dipstick: NEGATIVE
Ketones, ur: NEGATIVE
Nitrite: NEGATIVE
Nitrite: NEGATIVE
Specific Gravity, Urine: 1.02
Specific Gravity, Urine: 1.025
pH: 6.5
pH: 6.5

## 2010-12-30 LAB — POCT URINALYSIS DIP (DEVICE)
Bilirubin Urine: NEGATIVE
Hgb urine dipstick: NEGATIVE
Ketones, ur: NEGATIVE
Specific Gravity, Urine: 1.02
pH: 7

## 2010-12-31 LAB — POCT URINALYSIS DIP (DEVICE)
Bilirubin Urine: NEGATIVE
Glucose, UA: NEGATIVE
Glucose, UA: NEGATIVE
Glucose, UA: NEGATIVE
Hgb urine dipstick: NEGATIVE
Hgb urine dipstick: NEGATIVE
Hgb urine dipstick: NEGATIVE
Ketones, ur: NEGATIVE
Nitrite: NEGATIVE
Specific Gravity, Urine: >=1.03
pH: 5.5
pH: 6
pH: 6.5

## 2011-01-01 LAB — POCT URINALYSIS DIP (DEVICE)
Bilirubin Urine: NEGATIVE
Glucose, UA: NEGATIVE
Glucose, UA: NEGATIVE
Ketones, ur: NEGATIVE
Ketones, ur: NEGATIVE
Nitrite: NEGATIVE
Nitrite: NEGATIVE
pH: 6
pH: 6.5

## 2011-04-27 ENCOUNTER — Other Ambulatory Visit: Payer: Self-pay | Admitting: Family Medicine

## 2011-04-27 DIAGNOSIS — Z136 Encounter for screening for cardiovascular disorders: Secondary | ICD-10-CM

## 2011-04-27 DIAGNOSIS — Z0000 Encounter for general adult medical examination without abnormal findings: Secondary | ICD-10-CM

## 2011-05-04 ENCOUNTER — Other Ambulatory Visit (INDEPENDENT_AMBULATORY_CARE_PROVIDER_SITE_OTHER): Payer: Managed Care, Other (non HMO)

## 2011-05-04 DIAGNOSIS — Z136 Encounter for screening for cardiovascular disorders: Secondary | ICD-10-CM

## 2011-05-04 DIAGNOSIS — Z0000 Encounter for general adult medical examination without abnormal findings: Secondary | ICD-10-CM

## 2011-05-04 LAB — LIPID PANEL
Cholesterol: 211 mg/dL — ABNORMAL HIGH (ref 0–200)
HDL: 62.5 mg/dL (ref >39.00)
Total CHOL/HDL Ratio: 3
Triglycerides: 36 mg/dL (ref 0.0–149.0)
VLDL: 7.2 mg/dL (ref 0.0–40.0)

## 2011-05-04 LAB — BASIC METABOLIC PANEL
CO2: 25 mEq/L (ref 19–32)
Chloride: 106 mEq/L (ref 96–112)
Creatinine, Ser: 0.9 mg/dL (ref 0.4–1.2)
Potassium: 4.3 mEq/L (ref 3.5–5.1)
Sodium: 138 mEq/L (ref 135–145)

## 2011-05-04 LAB — LDL CHOLESTEROL, DIRECT: Direct LDL: 144.2 mg/dL

## 2011-05-11 ENCOUNTER — Telehealth: Payer: Self-pay | Admitting: *Deleted

## 2011-05-11 ENCOUNTER — Encounter: Payer: Self-pay | Admitting: Family Medicine

## 2011-05-11 ENCOUNTER — Other Ambulatory Visit (HOSPITAL_COMMUNITY)
Admission: RE | Admit: 2011-05-11 | Discharge: 2011-05-11 | Disposition: A | Discharge status: Home / Self Care | Payer: Managed Care, Other (non HMO) | Source: Ambulatory Visit | Attending: Family Medicine | Admitting: Family Medicine

## 2011-05-11 ENCOUNTER — Ambulatory Visit (INDEPENDENT_AMBULATORY_CARE_PROVIDER_SITE_OTHER): Payer: Managed Care, Other (non HMO) | Admitting: Family Medicine

## 2011-05-11 VITALS — BP 122/82 | HR 77 | Temp 98.6°F | Ht 69.0 in | Wt 207.5 lb

## 2011-05-11 DIAGNOSIS — R87810 Cervical high risk human papillomavirus (HPV) DNA test positive: Secondary | ICD-10-CM | POA: Insufficient documentation

## 2011-05-11 DIAGNOSIS — R739 Hyperglycemia, unspecified: Secondary | ICD-10-CM

## 2011-05-11 DIAGNOSIS — E785 Hyperlipidemia, unspecified: Secondary | ICD-10-CM

## 2011-05-11 DIAGNOSIS — R7309 Other abnormal glucose: Secondary | ICD-10-CM

## 2011-05-11 DIAGNOSIS — Z01419 Encounter for gynecological examination (general) (routine) without abnormal findings: Secondary | ICD-10-CM | POA: Insufficient documentation

## 2011-05-11 DIAGNOSIS — Z0000 Encounter for general adult medical examination without abnormal findings: Secondary | ICD-10-CM

## 2011-05-11 MED ORDER — TOLTERODINE TARTRATE 1 MG PO TABS: 1.0000 mg | ORAL_TABLET | Freq: Two times a day (BID) | ORAL | Status: DC

## 2011-05-11 MED ORDER — TOLTERODINE TARTRATE 2 MG PO TABS: 1.0000 mg | ORAL_TABLET | Freq: Two times a day (BID) | ORAL | Status: DC

## 2011-05-11 NOTE — Telephone Encounter (Signed)
Left message that rx was changed and sent to the pharmacy

## 2011-05-11 NOTE — Progress Notes (Signed)
Addended by: Dianne Dun on: 05/11/2011 02:34 PM   Modules accepted: Orders

## 2011-05-11 NOTE — Patient Instructions (Signed)
Great to see you. Please keep me posted with your symptoms- I sent the Detrol to your pharmacy.

## 2011-05-11 NOTE — Telephone Encounter (Signed)
Message copied by Sueanne Margarita on Tue May 11, 2011  2:42 PM ------      Message from: Dianne Dun      Created: Tue May 11, 2011  2:36 PM       Please let pt know that rx sent.      ----- Message -----         From: Benny Lennert, CMA         Sent: 05/11/2011  12:08 PM           To: Ruthe Mannan, MD            Pharmacist says that if  You do the 2 mg tablets 1/2 twice a day with number 30 it would be 40.47 which they said is a lot cheaper      ----- Message -----         From: Ruthe Mannan, MD         Sent: 05/11/2011  11:32 AM           To: Benny Lennert, CMA            Pt called and stated detrol is too expensive.  Please call her pharmacy and see what cheaper options they have for overactive bladder.      Thanks,      [employer]

## 2011-05-11 NOTE — Progress Notes (Signed)
41 yo female here for CPX.  Mixed urinary incontinence- kegel exercises helping a little bit but urge to urinate is getting worse. Wears a pad daily now. No dysuria.  Hyperlipidemia- Lab Results  Component Value Date   CHOL 211* 05/04/2011   HDL 62.50 05/04/2011   LDLDIRECT 144.2 05/04/2011   TRIG 36.0 05/04/2011   CHOLHDL 3 05/04/2011    She has really been working on exercising, now 5 times per week and at school, HDL has increased.   Glucose intolerance- fasting CBG 109.  Does have a h/o gestational diabetes with both pregnanies, diet controlled.  Admits to drinking sweet tea and eating foods high in carbs/sugar. No increased thirst or urination.   Does have strong FH of DM.   Well woman- G3P3, has mirena IUD which has truly regulated her mentrual cycle.  Although husband has had vasectomy, she wants to keep IUD in place for menstrual regulation.  Patient Active Problem List  Diagnoses  . HYPERLIPIDEMIA  . HYPERTENSION, BENIGN SYSTEMIC  . ACUTE PHARYNGITIS  . DE QUERVAIN'S TENOSYNOVITIS  . IUD MALFUNCTION  . CELLULITIS AND ABSCESS OF UNSPECIFIED SITE  . JOINT EFFUSION, KNEE  . Pain in joint, lower leg  . Routine general medical examination at a health care facility   Past Medical History  Diagnosis Date  . Onychia and paronychia of toe   . Anemia     acute blood loss in High school  . Gestational diabetes   . PIH (pregnancy induced hypertension)     severe, required Mgs04  . Postpartum endometritis    Past Surgical History  Procedure Date  . Cesarean section 02-04-1999     vbac 1-06 and 05-19-2005   History  Substance Use Topics  . Smoking status: Never Smoker   . Smokeless tobacco: Not on file  . Alcohol Use: No   No family history on file. No Known Allergies Current Outpatient Prescriptions on File Prior to Visit  Medication Sig Dispense Refill  . diclofenac (VOLTAREN) 75 MG EC tablet Take 75 mg by mouth daily.         The PMH, PSH, Social History,  Family History, Medications, and allergies have been reviewed in [facility], and have been updated if relevant.   Review of Systems       See HPI Patient reports no  vision/ hearing changes,anorexia, weight change, fever ,adenopathy, persistant / recurrent hoarseness, swallowing issues, chest pain, edema,persistant / recurrent cough, hemoptysis, dyspnea(rest, exertional, paroxysmal nocturnal), gastrointestinal  bleeding (melena, rectal bleeding), abdominal pain, excessive heart burn, GU symptoms(dysuria, hematuria, pyuria, voiding/incontinence  Issues) syncope, focal weakness, severe memory loss, concerning skin lesions, depression, anxiety, abnormal bruising/bleeding, major joint swelling, breast masses or abnormal vaginal bleeding.    Physical Exam BP 122/82  Pulse 77  Temp(Src) 98.6 F (37 C) (Oral)  Ht 5\' 9"  (1.753 m)  Wt 207 lb 8 oz (94.121 kg)  BMI 30.64 kg/m2 Wt Readings from Last 3 Encounters:  05/11/11 207 lb 8 oz (94.121 kg)  07/16/10 202 lb 12.8 oz (91.989 kg)  06/03/10 207 lb 8 oz (94.121 kg)     General:  Well-developed,well-nourished,in no acute distress; alert,appropriate and cooperative throughout examination Head:  normocephalic and atraumatic.   Eyes:  vision grossly intact, pupils equal, pupils round, and pupils reactive to light.   Ears:  R ear normal and L ear normal.   Nose:  no external deformity.   Mouth:  good dentition.   Neck:  No deformities, masses, or tenderness noted.  Breasts:  No mass, nodules, thickening, tenderness, bulging, retraction, inflamation, nipple discharge or skin changes noted.   Lungs:  Normal respiratory effort, chest expands symmetrically. Lungs are clear to auscultation, no crackles or wheezes. Heart:  Normal rate and regular rhythm. S1 and S2 normal without gallop, murmur, click, rub or other extra sounds. Abdomen:  Bowel sounds positive,abdomen soft and non-tender without masses, organomegaly or hernias noted. Rectal:  no external  abnormalities.   Genitalia:  Pelvic Exam:        External: normal female genitalia without lesions or masses        Vagina: normal without lesions or masses        Cervix: normal without lesions or masses, IUD strings visible        Adnexa: normal bimanual exam without masses or fullness        Uterus: normal by palpation        Pap smear: performed Msk:  No deformity or scoliosis noted of thoracic or lumbar spine.   Extremities:  No clubbing, cyanosis, edema, or deformity noted with normal full range of motion of all joints.   Neurologic:  alert & oriented X3 and gait normal.   Skin:  Intact without suspicious lesions or rashes Cervical Nodes:  No lymphadenopathy noted Axillary Nodes:  No palpable lymphadenopathy Psych:  Cognition and judgment appear intact. Alert and cooperative with normal attention span and concentration. No apparent delusions, illusions, hallucinations  Assessment and Plan: 1. Routine general medical examination at a health care facility   Reviewed preventive care protocols, scheduled due services, and updated immunizations Discussed nutrition, exercise, diet, and healthy lifestyle.    2. OAB- Will try detrol 1 mg twice daily Discussed possible side effects.  3. Hyperglycemia  Deteriorated.  She will cut out tea and other sugary drinks.

## 2011-06-29 ENCOUNTER — Other Ambulatory Visit: Payer: Self-pay | Admitting: Family Medicine

## 2011-06-29 MED ORDER — SULFAMETHOXAZOLE-TRIMETHOPRIM 800-160 MG PO TABS: 1.0000 | ORAL_TABLET | Freq: Two times a day (BID) | ORAL | Status: DC

## 2011-08-12 ENCOUNTER — Ambulatory Visit (INDEPENDENT_AMBULATORY_CARE_PROVIDER_SITE_OTHER): Payer: Managed Care, Other (non HMO) | Admitting: Family Medicine

## 2011-08-12 ENCOUNTER — Encounter: Payer: Self-pay | Admitting: Family Medicine

## 2011-08-12 VITALS — BP 140/70 | HR 76 | Temp 98.8°F | Wt 210.0 lb

## 2011-08-12 DIAGNOSIS — L0390 Cellulitis, unspecified: Secondary | ICD-10-CM

## 2011-08-12 DIAGNOSIS — L0291 Cutaneous abscess, unspecified: Secondary | ICD-10-CM

## 2011-08-12 MED ORDER — SULFAMETHOXAZOLE-TRIMETHOPRIM 800-160 MG PO TABS: 1.0000 | ORAL_TABLET | Freq: Two times a day (BID) | ORAL | Status: AC

## 2011-08-12 NOTE — Progress Notes (Signed)
  Subjective:    Patient ID: Barbara [name], female    DOB: 10/28/1970, 41 y.o.   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 Abscess.  Has been taking Bactrim ( had a few pills left from prior infection), draining on its own. No longer painful, redness is resolving. Afebrile. No n/v/d. Bother her husband and son have been treated for similar lesions.  Patient Active Problem List  Diagnoses  . HYPERLIPIDEMIA  . HYPERTENSION, BENIGN SYSTEMIC  . ACUTE PHARYNGITIS  . DE QUERVAIN'S TENOSYNOVITIS  . IUD MALFUNCTION  . CELLULITIS AND ABSCESS OF UNSPECIFIED SITE  . JOINT EFFUSION, KNEE  . Pain in joint, lower leg  . Routine general medical examination at a health care facility  . Hyperglycemia   Past Medical History  Diagnosis Date  . Onychia and paronychia of toe   . Anemia     acute blood loss in High school  . Gestational diabetes   . PIH (pregnancy induced hypertension)     severe, required Mgs04  . Postpartum endometritis    Past Surgical History  Procedure Date  . Cesarean section 02-04-1999     vbac 1-06 and 05-19-2005   History  Substance Use Topics  . Smoking status: Never Smoker   . Smokeless tobacco: Not on file  . Alcohol Use: No   No family history on file. No Known Allergies Current Outpatient Prescriptions on File Prior to Visit  Medication Sig Dispense Refill  . tolterodine (DETROL) 2 MG tablet Take 0.5 tablets (1 mg total) by mouth 2 (two) times daily.  30 tablet  3   The PMH, PSH, Social History, Family History, Medications, and allergies have been reviewed in [facility], and have been updated if relevant.   Review of Systems See HPI    Objective:   Physical Exam  BP 140/70  Pulse 76  Temp(Src) 98.8 F (37.1 C) (Oral)  Wt 210 lb (95.255 kg)  General:  Well-developed,well-nourished,in no acute distress; alert,appropriate and cooperative throughout examination Head:  normocephalic and atraumatic.   Eyes:  vision grossly intact, pupils  equal, pupils round, and pupils reactive to light.   Ears:  R ear normal and L ear normal.   Nose:  no external deformity.   Mouth:  good dentition.   Lungs:  Normal respiratory effort, chest expands symmetrically. Lungs are clear to auscultation, no crackles or wheezes. Heart:  Normal rate and regular rhythm. S1 and S2 normal without gallop, murmur, click, rub or other extra sounds. Msk:  No deformity or scoliosis noted of thoracic or lumbar spine.   Extremities:  No clubbing, cyanosis, edema, or deformity noted with normal full range of motion of all joints.   Neurologic:  alert & oriented X3 and gait normal.   Skin:  1 cm firm, subcutaneous abscess with large opening, actively draining thick, clear fluid, non odorous, no surrounding warmth or erythema. Psych:  Cognition and judgment appear intact. Alert and cooperative with normal attention span and concentration. No apparent delusions, illusions, hallucinations       Assessment & Plan:   1. Cellulitis and abscess of unspecified site    New- actively draining, no I and D necessary. Advised continued warm soaks and compresses. Advised not using abx for shortened periods of time- will cause resistance. Will send in rx for bactrim- advised to finish 10 day course. The patient indicates understanding of these issues and agrees with the plan.

## 2011-11-19 ENCOUNTER — Encounter: Payer: Self-pay | Admitting: Family Medicine

## 2011-11-19 ENCOUNTER — Ambulatory Visit (INDEPENDENT_AMBULATORY_CARE_PROVIDER_SITE_OTHER): Payer: Managed Care, Other (non HMO) | Admitting: Family Medicine

## 2011-11-19 VITALS — BP 140/90 | HR 68 | Temp 98.2°F | Wt 211.0 lb

## 2011-11-19 DIAGNOSIS — J020 Streptococcal pharyngitis: Secondary | ICD-10-CM

## 2011-11-19 MED ORDER — PENICILLIN V POTASSIUM 500 MG PO TABS: 500.0000 mg | ORAL_TABLET | Freq: Three times a day (TID) | ORAL | Status: AC

## 2011-11-19 NOTE — Progress Notes (Signed)
SUBJECTIVE: 41 y.o. female with sore throat, myalgias, swollen glands, headache and fever for 5 days. No history of rheumatic fever. Other symptoms: chills.  Patient Active Problem List  Diagnosis  . HYPERLIPIDEMIA  . HYPERTENSION, BENIGN SYSTEMIC  . ACUTE PHARYNGITIS  . DE QUERVAIN'S TENOSYNOVITIS  . IUD MALFUNCTION  . CELLULITIS AND ABSCESS OF UNSPECIFIED SITE  . JOINT EFFUSION, KNEE  . Pain in joint, lower leg  . Routine general medical examination at a health care facility  . Hyperglycemia   Past Medical History  Diagnosis Date  . Onychia and paronychia of toe   . Anemia     acute blood loss in High school  . Gestational diabetes   . PIH (pregnancy induced hypertension)     severe, required Mgs04  . Postpartum endometritis    Past Surgical History  Procedure Date  . Cesarean section 02-04-1999     vbac 1-06 and 05-19-2005   History  Substance Use Topics  . Smoking status: Never Smoker   . Smokeless tobacco: Not on file  . Alcohol Use: No   No family history on file. No Known Allergies No current outpatient prescriptions on file prior to visit.   The PMH, PSH, Social History, Family History, Medications, and allergies have been reviewed in [facility], and have been updated if relevant.  OBJECTIVE:  BP 140/90  Pulse 68  Temp 98.2 F (36.8 C)  Wt 211 lb (95.709 kg)   Appears alert, well appearing, and in no distress. Ears: bilateral TM's and external ear canals normal Oropharynx: erythematous and tonsils hypertrophied with exudate Neck: supple, no significant adenopathy Lungs: clear to auscultation, no wheezes, rales or rhonchi, symmetric air entry Rapid Strep test is positive  ASSESSMENT: Streptococcal pharyngitis  PLAN: Per orders. PCN 500 mg three times daily x 10 days.  Gargle, use acetaminophen or other OTC analgesic, and take Rx fully as prescribed. Call if other family members develop similar symptoms. See prn.

## 2011-11-19 NOTE — Patient Instructions (Addendum)

## 2011-11-24 ENCOUNTER — Other Ambulatory Visit: Payer: Self-pay | Admitting: Family Medicine

## 2011-11-24 DIAGNOSIS — R87811 Vaginal high risk human papillomavirus (HPV) DNA test positive: Secondary | ICD-10-CM

## 2011-11-24 NOTE — Progress Notes (Signed)
Talked with pt about positive HPV.  She is low risk however she did test positive for high risk strain.  Rather than repeating in 1 year, will send for colopo. The patient indicates understanding of these issues and agrees with the plan.

## 2012-04-26 ENCOUNTER — Other Ambulatory Visit: Payer: Self-pay | Admitting: Family Medicine

## 2012-06-23 ENCOUNTER — Encounter: Payer: Self-pay | Admitting: Hematology

## 2012-06-26 ENCOUNTER — Encounter: Payer: Self-pay | Admitting: Obstetrics and Gynecology

## 2012-06-26 ENCOUNTER — Ambulatory Visit (INDEPENDENT_AMBULATORY_CARE_PROVIDER_SITE_OTHER): Payer: Managed Care, Other (non HMO) | Admitting: Obstetrics and Gynecology

## 2012-06-26 VITALS — BP 128/80 | Wt 211.0 lb

## 2012-06-26 DIAGNOSIS — R87810 Cervical high risk human papillomavirus (HPV) DNA test positive: Secondary | ICD-10-CM

## 2012-06-26 NOTE — Patient Instructions (Signed)
Abnormal Pap Test Information  During a Pap test, the cells on the surface of your cervix are checked to see if they look normal, abnormal, or if they show signs of having been altered by a certain type of virus called human papillomavirus, or HPV. Cervical cells that have been affected by HPV are called dysplasia. Dysplasia is not cancer, but describes abnormal cells found on the surface of the cervix. Depending on the degree of dysplasia, some of the cells may be considered pre-cancerous and may turn into cancer over time if follow up with a caregiver is delayed.   WHAT DOES AN ABNORMAL PAP TEST MEAN?  Having an abnormal pap test does not mean that you have cancer. However, certain types of abnormal pap tests can be a sign that a person is at a higher risk of developing cancer. Your caregiver will want to do other tests to find out more about the abnormal cells. Your abnormal Pap test results could show:   · Small and uncertain changes that should be carefully watched.    · Cervical dysplasia that has caused mild changes and can be followed over time.    · Cervical dysplasia that is more severe and needs to be followed and treated to ensure the problem goes away.  · Cancer.    When severe cervical dysplasia is found and treated early, it rarely will grow into cancer.   WHAT WILL BE DONE ABOUT MY ABNORMAL PAP TEST?  · A colposcopy may be needed. This is a procedure where your cervix is examined using light and magnification.  · A small tissue sample of your cervix (biopsy) may need to be removed and then examined. This is often performed if there are areas that appear infected.  · A sample of cells from the cervical canal may be removed with either a small brush or scraping instrument (curette).  Based on the results of the procedures above, some caregivers may recommend either cryotherapy of the cervix or a surgical LEEP where a portion of the cervix is removed. LEEP is short for "loop electrical excisional  procedure." Rarely, a caregiver may recommend a cone biopsy. This is a procedure where a small, cone-shaped sample of your cervix is taken out. The part that is taken out is the area where the abnormal cells are.    WHAT IF I HAVE A DYSPLASIA OR A CANCER?  You may be referred to a specialist. Radiation may also be a treatment for more advanced cancer. Having a hysterectomy is the last treatment option for dysplasia, but it is a more common treatment for someone with cancer. All treatment options will be discussed with you by your caregiver.  WHAT SHOULD YOU DO AFTER BEING TREATED?  If you have had an abnormal pap test, you should continue to have regular pap tests and check-ups as directed by your caregiver. Your cervical problem will be carefully watched so it does not get worse. Also, your caregiver can watch for, and treat, any new problems that may come up.  Document Released: 07/07/2010 Document Revised: 06/14/2011 Document Reviewed: 03/18/2011  [employer]® Patient Information ©2013 [employer].

## 2012-06-26 NOTE — Progress Notes (Signed)
36 yrs  Married [place] female [id number] for repeat pap smear due to pap  05/11/11 showing + HPV type 16 with normal cytology done by Dr. Ruthe Mannan.. Colpo 12/10/11 showed no lesions and ECC was neg.   Denies problems or vaginal symptoms or STD concerns.  LMP:  06/22/12 Contraception:*Mirena  General appearance: Healthy female    Exam: Abd soft and NT.  No masses.       Pelvic:  Ext, BUS, vag, cx normal.  Despite h/o current IUD use, no strings visible. Pap taken.  BM - uterus anterior, NT, mobile.  Adnexa normal.  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  Plan:   Pap R with HPV today.  If normal, repeat in 1 year. Pt desires to return to Dr. Dayton Martes for pap due 2015.   If abnormal, colpo again.                                                                                                                            Discussed importance of follow up as indicated. Questions addressed.   Marland Kitchen

## 2012-08-21 ENCOUNTER — Telehealth: Payer: Self-pay | Admitting: Family Medicine

## 2012-08-21 NOTE — Telephone Encounter (Signed)
Pt's husband is coming in on 09/12/2012 at 9:15 a.m for a CPE and she would like to come in on the same day close to his apptmt time for a CPE. Can you accommodate her? Thank you.

## 2012-08-21 NOTE — Telephone Encounter (Signed)
Yes ok to accomodate

## 2012-08-22 NOTE — Telephone Encounter (Signed)
Left mssg for pt to return call.

## 2012-08-23 ENCOUNTER — Encounter: Payer: Managed Care, Other (non HMO) | Admitting: Family Medicine

## 2012-08-24 NOTE — Telephone Encounter (Signed)
Left another mssg for pt to return call

## 2012-08-24 NOTE — Telephone Encounter (Signed)
Pt called and changed her mind, will keep the July apptmt

## 2012-10-24 ENCOUNTER — Encounter: Payer: Managed Care, Other (non HMO) | Admitting: Family Medicine

## 2012-10-26 ENCOUNTER — Encounter: Payer: Self-pay | Admitting: Family Medicine

## 2012-10-26 ENCOUNTER — Ambulatory Visit (INDEPENDENT_AMBULATORY_CARE_PROVIDER_SITE_OTHER): Payer: Managed Care, Other (non HMO) | Admitting: Family Medicine

## 2012-10-26 VITALS — BP 138/84 | HR 76 | Temp 99.0°F | Ht 69.5 in | Wt 211.0 lb

## 2012-10-26 DIAGNOSIS — Z0000 Encounter for general adult medical examination without abnormal findings: Secondary | ICD-10-CM

## 2012-10-26 DIAGNOSIS — E785 Hyperlipidemia, unspecified: Secondary | ICD-10-CM

## 2012-10-26 DIAGNOSIS — Z8632 Personal history of gestational diabetes: Secondary | ICD-10-CM | POA: Insufficient documentation

## 2012-10-26 DIAGNOSIS — I10 Essential (primary) hypertension: Secondary | ICD-10-CM

## 2012-10-26 DIAGNOSIS — Z1231 Encounter for screening mammogram for malignant neoplasm of breast: Secondary | ICD-10-CM

## 2012-10-26 LAB — CBC WITH DIFFERENTIAL/PLATELET
Basophils Absolute: 0 10*3/uL (ref 0.0–0.1)
Basophils Relative: 0.5 % (ref 0.0–3.0)
Eosinophils Absolute: 0.1 10*3/uL (ref 0.0–0.7)
Eosinophils Relative: 1.5 % (ref 0.0–5.0)
HCT: 38.2 % (ref 36.0–46.0)
Hemoglobin: 12.8 g/dL (ref 12.0–15.0)
Lymphocytes Relative: 29 % (ref 12.0–46.0)
Lymphs Abs: 1.7 10*3/uL (ref 0.7–4.0)
MCHC: 33.6 g/dL (ref 30.0–36.0)
MCV: 94.4 fl (ref 78.0–100.0)
Monocytes Absolute: 0.5 10*3/uL (ref 0.1–1.0)
Monocytes Relative: 7.8 % (ref 3.0–12.0)
Neutro Abs: 3.7 10*3/uL (ref 1.4–7.7)
Neutrophils Relative %: 61.2 % (ref 43.0–77.0)
Platelets: 272 10*3/uL (ref 150.0–400.0)
RBC: 4.04 Mil/uL (ref 3.87–5.11)
RDW: 13.8 % (ref 11.5–14.6)
WBC: 6 10*3/uL (ref 4.5–10.5)

## 2012-10-26 LAB — COMPREHENSIVE METABOLIC PANEL
AST: 19 U/L (ref 0–37)
Albumin: 4 g/dL (ref 3.5–5.2)
Alkaline Phosphatase: 54 U/L (ref 39–117)
BUN: 13 mg/dL (ref 6–23)
Creatinine, Ser: 1 mg/dL (ref 0.4–1.2)
Glucose, Bld: 118 mg/dL — ABNORMAL HIGH (ref 70–99)
Potassium: 4.7 mEq/L (ref 3.5–5.1)
Total Bilirubin: 0.4 mg/dL (ref 0.3–1.2)

## 2012-10-26 LAB — LIPID PANEL
Cholesterol: 220 mg/dL — ABNORMAL HIGH (ref 0–200)
HDL: 57.5 mg/dL (ref >39.00)
Total CHOL/HDL Ratio: 4
Triglycerides: 51 mg/dL (ref 0.0–149.0)

## 2012-10-26 LAB — VITAMIN B12: Vitamin B-12: 354 pg/mL (ref 211–911)

## 2012-10-26 LAB — HEMOGLOBIN A1C: Hgb A1c MFr Bld: 6.2 % (ref 4.6–6.5)

## 2012-10-26 MED ORDER — TOLTERODINE TARTRATE 1 MG PO TABS: 1.0000 mg | ORAL_TABLET | Freq: Two times a day (BID) | ORAL | Status: DC

## 2012-10-26 NOTE — Progress Notes (Addendum)
42 yo very pleasant female here for CPX.   G3P3, has mirena IUD which has truly regulated her mentrual cycle.  Although husband has had vasectomy, she wants to keep IUD in place for menstrual regulation.   Saw Dr. Genice Rouge in 06/2012- pap neg, HPV neg.  Cleared for repeat pap in 2015.  Due for first screening mammogram.  No family h/o breast CA.  She did have benign breast mass removed when she was a teenager.  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  Weight stable.  Wt Readings from Last 3 Encounters:  10/26/12 211 lb (95.709 kg)  06/26/12 211 lb (95.709 kg)  11/19/11 211 lb (95.709 kg)     H/o Glucose intolerance-   Does have a h/o gestational diabetes with both pregnanies, diet controlled.  Admits to drinking sweet tea and eating foods high in carbs/sugar. No increased thirst or urination.   Does have strong FH of DM.  Has noticed more tingling in her feet and back.      Patient Active Problem List   Diagnosis Date Noted  . Routine general medical examination at a health care facility 05/11/2011  . HYPERLIPIDEMIA 10/23/2009  . DE QUERVAIN'S TENOSYNOVITIS 09/04/2009  . HYPERTENSION, BENIGN SYSTEMIC 06/02/2006   Past Medical History  Diagnosis Date  . Onychia and paronychia of toe   . Anemia     acute blood loss in High school  . Gestational diabetes   . PIH (pregnancy induced hypertension)     severe, required Mgs04  . Postpartum endometritis    Past Surgical History  Procedure Laterality Date  . Cesarean section  02-04-1999     vbac 1-06 and 05-19-2005  . Cholecystectomy     History  Substance Use Topics  . Smoking status: Never Smoker   . Smokeless tobacco: Never Used  . Alcohol Use: No   Family History  Problem Relation Age of Onset  . Diabetes  Mother   . Cancer Mother     breast and lung  . Hypertension Mother    No Known Allergies Current Outpatient Prescriptions on File Prior to Visit  Medication Sig Dispense Refill  . levonorgestrel (MIRENA) 20 MCG/24HR IUD 1 each by Intrauterine route once.      . tolterodine (DETROL) 1 MG tablet Take 1 mg by mouth 2 (two) times daily.       No current facility-administered medications on file prior to visit.   The PMH, PSH, Social History, Family History, Medications, and allergies have been reviewed in [facility], and have been updated if relevant.   Review of Systems       See HPI Patient reports no  vision/ hearing changes,anorexia, weight change, fever ,adenopathy, persistant / recurrent hoarseness, swallowing issues, chest pain, edema,persistant / recurrent cough, hemoptysis, dyspnea(rest, exertional, paroxysmal nocturnal), gastrointestinal  bleeding (melena, rectal bleeding), abdominal pain, excessive heart burn, GU symptoms(dysuria, hematuria, pyuria, voiding/incontinence  Issues) syncope, focal weakness, severe memory loss, concerning skin lesions, depression, anxiety, abnormal bruising/bleeding, major joint swelling, breast masses or abnormal vaginal bleeding.    Physical Exam BP 138/84  Pulse 76  Temp(Src) 99 F (37.2 C)  Ht 5' 9.5" (1.765 m)  Wt 211 lb (95.709 kg)  BMI 30.72 kg/m2 Wt Readings from Last 3 Encounters:  10/26/12  211 lb (95.709 kg)  06/26/12 211 lb (95.709 kg)  11/19/11 211 lb (95.709 kg)     General:  Well-developed,well-nourished,in no acute distress; alert,appropriate and cooperative throughout examination Head:  normocephalic and atraumatic.   Eyes:  vision grossly intact, pupils equal, pupils round, and pupils reactive to light.   Ears:  R ear normal and L ear normal.   Nose:  no external deformity.   Mouth:  good dentition.   Neck:  No deformities, masses, or tenderness noted. Breasts:  No mass, nodules, thickening, tenderness, bulging, retraction,  inflamation, nipple discharge or skin changes noted.   Lungs:  Normal respiratory effort, chest expands symmetrically. Lungs are clear to auscultation, no crackles or wheezes. Heart:  Normal rate and regular rhythm. S1 and S2 normal without gallop, murmur, click, rub or other extra sounds. Abdomen:  Bowel sounds positive,abdomen soft and non-tender without masses, organomegaly or hernias noted. Msk:  No deformity or scoliosis noted of thoracic or lumbar spine.   Extremities:  No clubbing, cyanosis, edema, or deformity noted with normal full range of motion of all joints.   Neurologic:  alert & oriented X3 and gait normal.   Skin:  Intact without suspicious lesions or rashes Cervical Nodes:  No lymphadenopathy noted Axillary Nodes:  No palpable lymphadenopathy Psych:  Cognition and judgment appear intact. Alert and cooperative with normal attention span and concentration. No apparent delusions, illusions, hallucinations Foot exam: Normal inspection No skin breakdown No calluses  Normal DP pulses Normal sensation to light touch and monofilament Nails normal  Assessment and Plan: 1. Routine general medical examination at a health care facility Reviewed preventive care protocols, scheduled due services, and updated immunizations Discussed nutrition, exercise, diet, and healthy lifestyle.  - Comprehensive metabolic panel - CBC with Differential - TSH - Vitamin B12  2. HYPERTENSION, BENIGN SYSTEMIC Stable.    3. HYPERLIPIDEMIA Recheck labs.  - Lipid Panel  4. Other screening mammogram Due for first screen mammogram. - MM Digital Screening; Future  5. History of gestational diabetes  - Hemoglobin A1C

## 2012-10-26 NOTE — Patient Instructions (Addendum)
It was so good to see you! Good luck with your next adventure! Keep me posted.  We will call you with your lab results.  You can also view them online.  Please stop by to see Shirlee Limerick or Bonita Quin on your way out to set up your mammogram.

## 2012-10-26 NOTE — Addendum Note (Signed)
Addended by: Dianne Dun on: 10/26/2012 04:42 PM   Modules accepted: Orders

## 2012-10-31 ENCOUNTER — Encounter: Payer: Self-pay | Admitting: Family Medicine

## 2012-10-31 ENCOUNTER — Ambulatory Visit (INDEPENDENT_AMBULATORY_CARE_PROVIDER_SITE_OTHER): Payer: Managed Care, Other (non HMO) | Admitting: Family Medicine

## 2012-10-31 VITALS — BP 124/92 | HR 78 | Temp 98.3°F | Ht 69.5 in | Wt 210.0 lb

## 2012-10-31 DIAGNOSIS — H9190 Unspecified hearing loss, unspecified ear: Secondary | ICD-10-CM

## 2012-10-31 DIAGNOSIS — R739 Hyperglycemia, unspecified: Secondary | ICD-10-CM | POA: Insufficient documentation

## 2012-10-31 DIAGNOSIS — H9193 Unspecified hearing loss, bilateral: Secondary | ICD-10-CM | POA: Insufficient documentation

## 2012-10-31 DIAGNOSIS — R7309 Other abnormal glucose: Secondary | ICD-10-CM

## 2012-10-31 NOTE — Progress Notes (Signed)
Subjective:    Patient ID: Barbara [name], female    DOB: Nov 12, 1970, 42 y.o.   MRN: [id number]  HPI  Very pleasant 42 yo female here with her husband to discuss elevated blood sugar.  H/o gestational diabetes.  CBG 118 (fasting), a1c 6.2.  Admits to eating a biscuit and large sweet tea everyday.  Also likes oreos and fried foods.  Does have strong FH of DM.  Lab Results  Component Value Date   CREATININE 1.0 10/26/2012   Husband concerned she has had decreased hearing.  Patient Active Problem List   Diagnosis Date Noted  . Decreased hearing of both ears 10/31/2012  . Elevated blood sugar 10/31/2012  . History of gestational diabetes 10/26/2012  . Routine general medical examination at a health care facility 05/11/2011  . HYPERLIPIDEMIA 10/23/2009  . HYPERTENSION, BENIGN SYSTEMIC 06/02/2006   Past Medical History  Diagnosis Date  . Onychia and paronychia of toe   . Anemia     acute blood loss in High school  . Gestational diabetes   . PIH (pregnancy induced hypertension)     severe, required Mgs04  . Postpartum endometritis    Past Surgical History  Procedure Laterality Date  . Cesarean section  02-04-1999     vbac 1-06 and 05-19-2005  . Cholecystectomy     History  Substance Use Topics  . Smoking status: Never Smoker   . Smokeless tobacco: Never Used  . Alcohol Use: No   Family History  Problem Relation Age of Onset  . Diabetes Mother   . Cancer Mother     breast and lung  . Hypertension Mother    No Known Allergies Current Outpatient Prescriptions on File Prior to Visit  Medication Sig Dispense Refill  . levonorgestrel (MIRENA) 20 MCG/24HR IUD 1 each by Intrauterine route once.      . tolterodine (DETROL) 1 MG tablet Take 1 tablet (1 mg total) by mouth 2 (two) times daily.  60 tablet  0   No current facility-administered medications on file prior to visit.   The PMH, PSH, Social History, Family History, Medications, and allergies have been  reviewed in [facility], and have been updated if relevant.   Review of Systems See HPI    Objective:   Physical Exam BP 124/92  Pulse 78  Temp(Src) 98.3 F (36.8 C)  Ht 5' 9.5" (1.765 m)  Wt 210 lb (95.255 kg)  BMI 30.58 kg/m2  General:  Well-developed,well-nourished,in no acute distress; alert,appropriate and cooperative throughout examination Head:  normocephalic and atraumatic.   Eyes:  vision grossly intact, pupils equal, pupils round, and pupils reactive to light.   Ears:  R ear normal and L ear normal.   Extremities:  No clubbing, cyanosis, edema, or deformity noted with normal full range of motion of all joints.   Neurologic:  alert & oriented X3 and gait normal.   Skin:  Intact without suspicious lesions or rashes Psych:  Cognition and judgment appear intact. Alert and cooperative with normal attention span and concentration. No apparent delusions, illusions, hallucinations     Assessment & Plan:  1. Decreased hearing of both ears Hearing screen normal and no cerumen on exam. If symptoms persist, refer to audiology. The patient indicates understanding of these issues and agrees with the plan.   2. Elevated blood sugar Deteriorated.  She is motivated to improve diet.  Discussed low sugar/low carb diet.  Has been to nutritionist in past when she had gestational diabetes (diet controlled). Follow  up in 1 month.  If remains elevated, will add Metformin.

## 2012-10-31 NOTE — Patient Instructions (Addendum)
Good to see you. Please cut back on starches and sugars like we discussed.  Come see me in one month.  Have a great rest of your summer!

## 2012-11-06 ENCOUNTER — Ambulatory Visit: Payer: Managed Care, Other (non HMO)

## 2012-11-21 ENCOUNTER — Ambulatory Visit
Admission: RE | Admit: 2012-11-21 | Discharge: 2012-11-21 | Disposition: A | Discharge status: Home / Self Care | Payer: Managed Care, Other (non HMO) | Source: Ambulatory Visit | Attending: Family Medicine | Admitting: Family Medicine

## 2012-11-21 DIAGNOSIS — Z1231 Encounter for screening mammogram for malignant neoplasm of breast: Secondary | ICD-10-CM

## 2012-12-01 ENCOUNTER — Ambulatory Visit: Payer: Managed Care, Other (non HMO) | Admitting: Family Medicine

## 2013-01-08 ENCOUNTER — Ambulatory Visit (INDEPENDENT_AMBULATORY_CARE_PROVIDER_SITE_OTHER): Payer: Managed Care, Other (non HMO) | Admitting: Family Medicine

## 2013-01-08 ENCOUNTER — Encounter: Payer: Self-pay | Admitting: Family Medicine

## 2013-01-08 VITALS — BP 122/76 | HR 77 | Temp 99.5°F | Wt 213.0 lb

## 2013-01-08 DIAGNOSIS — R7309 Other abnormal glucose: Secondary | ICD-10-CM

## 2013-01-08 DIAGNOSIS — R739 Hyperglycemia, unspecified: Secondary | ICD-10-CM

## 2013-01-08 DIAGNOSIS — Z23 Encounter for immunization: Secondary | ICD-10-CM

## 2013-01-08 NOTE — Patient Instructions (Addendum)
Good to see you. We will call you with your lab results.  Hang in there. Call me anytime.

## 2013-01-08 NOTE — Progress Notes (Signed)
Subjective:    Patient ID: Barbara [name], female    DOB: 1970-09-26, 42 y.o.   MRN: [id number]  HPI  Very pleasant 42 yo female here with her husband to follow up elevated blood sugar.  H/o gestational diabetes.  Lab Results  Component Value Date   HGBA1C 6.2 10/26/2012   In July, we discussed elevated a1c.  She did not want to see a nutritionist at that time since she met with a nutritionist when she had gestational DM.  Also did not yet want to start metformin as she felt she could make a lot of changes in her diet which she has been working on.  Does have strong FH of DM.  Wt Readings from Last 3 Encounters:  01/08/13 213 lb (96.616 kg)  10/31/12 210 lb (95.255 kg)  10/26/12 211 lb (95.709 kg)    Patient Active Problem List   Diagnosis Date Noted  . Decreased hearing of both ears 10/31/2012  . Elevated blood sugar 10/31/2012  . History of gestational diabetes 10/26/2012  . Routine general medical examination at a health care facility 05/11/2011  . HYPERLIPIDEMIA 10/23/2009  . HYPERTENSION, BENIGN SYSTEMIC 06/02/2006   Past Medical History  Diagnosis Date  . Onychia and paronychia of toe   . Anemia     acute blood loss in High school  . Gestational diabetes   . PIH (pregnancy induced hypertension)     severe, required Mgs04  . Postpartum endometritis    Past Surgical History  Procedure Laterality Date  . Cesarean section  02-04-1999     vbac 1-06 and 05-19-2005  . Cholecystectomy     History  Substance Use Topics  . Smoking status: Never Smoker   . Smokeless tobacco: Never Used  . Alcohol Use: No   Family History  Problem Relation Age of Onset  . Diabetes Mother   . Cancer Mother     breast and lung  . Hypertension Mother    No Known Allergies Current Outpatient Prescriptions on File Prior to Visit  Medication Sig Dispense Refill  . levonorgestrel (MIRENA) 20 MCG/24HR IUD 1 each by Intrauterine route once.      . tolterodine (DETROL) 1 MG tablet  Take 1 tablet (1 mg total) by mouth 2 (two) times daily.  60 tablet  0   No current facility-administered medications on file prior to visit.   The PMH, PSH, Social History, Family History, Medications, and allergies have been reviewed in [facility], and have been updated if relevant.   Review of Systems See HPI    Objective:   Physical Exam BP 122/76  Pulse 77  Temp(Src) 99.5 F (37.5 C) (Oral)  Wt 213 lb (96.616 kg)  BMI 31.01 kg/m2  SpO2 98%  General:  Well-developed,well-nourished,in no acute distress; alert,appropriate and cooperative throughout examination Head:  normocephalic and atraumatic.   Eyes:  vision grossly intact, pupils equal, pupils round, and pupils reactive to light.   Ears:  R ear normal and L ear normal.   Extremities:  No clubbing, cyanosis, edema, or deformity noted with normal full range of motion of all joints.   Neurologic:  alert & oriented X3 and gait normal.   Skin:  Intact without suspicious lesions or rashes Psych:  Cognition and judgment appear intact. Alert and cooperative with normal attention span and concentration. No apparent delusions, illusions, hallucinations     Assessment & Plan:  1. Elevated blood sugar Making life style changes. Will recheck labs today. - Hemoglobin A1c -  Comprehensive metabolic panel  2. Need for prophylactic vaccination and inoculation against influenza  - Flu Vaccine QUAD 36+ mos PF IM (Fluarix)

## 2013-01-09 ENCOUNTER — Encounter: Payer: Self-pay | Admitting: Family Medicine

## 2013-01-09 LAB — COMPREHENSIVE METABOLIC PANEL
AST: 19 U/L (ref 0–37)
Alkaline Phosphatase: 52 U/L (ref 39–117)
BUN: 14 mg/dL (ref 6–23)
Calcium: 9.2 mg/dL (ref 8.4–10.5)
Chloride: 102 mEq/L (ref 96–112)
Creatinine, Ser: 0.9 mg/dL (ref 0.4–1.2)
Glucose, Bld: 160 mg/dL — ABNORMAL HIGH (ref 70–99)
Total Bilirubin: 0.4 mg/dL (ref 0.3–1.2)

## 2013-01-09 LAB — HEMOGLOBIN A1C: Hgb A1c MFr Bld: 6.1 % (ref 4.6–6.5)

## 2013-05-18 ENCOUNTER — Ambulatory Visit: Payer: Managed Care, Other (non HMO) | Admitting: Family Medicine

## 2013-06-01 ENCOUNTER — Encounter: Payer: Self-pay | Admitting: Family Medicine

## 2013-06-01 ENCOUNTER — Ambulatory Visit (INDEPENDENT_AMBULATORY_CARE_PROVIDER_SITE_OTHER): Payer: Managed Care, Other (non HMO) | Admitting: Family Medicine

## 2013-06-01 VITALS — BP 130/74 | HR 96 | Temp 98.1°F | Ht 69.0 in | Wt 218.0 lb

## 2013-06-01 DIAGNOSIS — Z30432 Encounter for removal of intrauterine contraceptive device: Secondary | ICD-10-CM

## 2013-06-01 DIAGNOSIS — Z538 Procedure and treatment not carried out for other reasons: Secondary | ICD-10-CM | POA: Insufficient documentation

## 2013-06-01 DIAGNOSIS — Z975 Presence of (intrauterine) contraceptive device: Secondary | ICD-10-CM | POA: Insufficient documentation

## 2013-06-01 NOTE — Progress Notes (Signed)
Pre visit review using our clinic review tool, if applicable. No additional management support is needed unless otherwise documented below in the visit note. 

## 2013-06-01 NOTE — Progress Notes (Signed)
   Subjective:   Patient ID: Barbara [name], female    DOB: Jun 14, 1970, 43 y.o.   MRN: [id number]  Barbara [name] is a pleasant 43 y.o. year old female who presents to clinic today with Contraception  on 06/01/2013  HPI: Has had her mirena IUD for 5 years and would like it removed. Husband had vasectomy a couple of years ago.  They no longer desire pregnancy.  Patient Active Problem List   Diagnosis Date Noted  . Encounter for IUD removal 06/01/2013  . Decreased hearing of both ears 10/31/2012  . Elevated blood sugar 10/31/2012  . History of gestational diabetes 10/26/2012  . Routine general medical examination at a health care facility 05/11/2011  . HYPERLIPIDEMIA 10/23/2009  . HYPERTENSION, BENIGN SYSTEMIC 06/02/2006   Past Medical History  Diagnosis Date  . Onychia and paronychia of toe   . Anemia     acute blood loss in High school  . Gestational diabetes   . PIH (pregnancy induced hypertension)     severe, required Mgs04  . Postpartum endometritis    Past Surgical History  Procedure Laterality Date  . Cesarean section  02-04-1999     vbac 1-06 and 05-19-2005  . Cholecystectomy     History  Substance Use Topics  . Smoking status: Never Smoker   . Smokeless tobacco: Never Used  . Alcohol Use: No   Family History  Problem Relation Age of Onset  . Diabetes Mother   . Cancer Mother     breast and lung  . Hypertension Mother    No Known Allergies Current Outpatient Prescriptions on File Prior to Visit  Medication Sig Dispense Refill  . levonorgestrel (MIRENA) 20 MCG/24HR IUD 1 each by Intrauterine route once.      . tolterodine (DETROL) 1 MG tablet Take 1 tablet (1 mg total) by mouth 2 (two) times daily.  60 tablet  0   No current facility-administered medications on file prior to visit.   The PMH, PSH, Social History, Family History, Medications, and allergies have been reviewed in [facility], and have been updated if relevant.   Review of Systems    See  HPI Objective:    BP 130/74  Pulse 96  Temp(Src) 98.1 F (36.7 C) (Oral)  Ht 5\' 9"  (1.753 m)  Wt 218 lb (98.884 kg)  BMI 32.18 kg/m2  SpO2 97%  LMP 05/26/2013   Physical Exam  Gen:  Alert, pleasant, NAD GU: Speculum inserted, cervix visualized but unable to visualize strings     Assessment & Plan:   Encounter for IUD removal No Follow-up on file.

## 2013-06-01 NOTE — Patient Instructions (Signed)
Good to see you. Give my love to the family.  Please stop by to see Rosaria Ferries on your way out to set up your ultrasound.

## 2013-06-01 NOTE — Assessment & Plan Note (Signed)
Will get ultrasound for IUD placement. No strings visualized- unable to remove IUD today.

## 2013-06-08 ENCOUNTER — Ambulatory Visit (HOSPITAL_COMMUNITY)
Admission: RE | Admit: 2013-06-08 | Discharge: 2013-06-08 | Disposition: A | Discharge status: Home / Self Care | Payer: Managed Care, Other (non HMO) | Source: Ambulatory Visit | Attending: Family Medicine | Admitting: Family Medicine

## 2013-06-08 DIAGNOSIS — D250 Submucous leiomyoma of uterus: Secondary | ICD-10-CM | POA: Insufficient documentation

## 2013-06-08 DIAGNOSIS — Z30431 Encounter for routine checking of intrauterine contraceptive device: Secondary | ICD-10-CM | POA: Insufficient documentation

## 2013-06-08 DIAGNOSIS — Z30432 Encounter for removal of intrauterine contraceptive device: Secondary | ICD-10-CM

## 2013-06-12 ENCOUNTER — Other Ambulatory Visit: Payer: Self-pay | Admitting: Family Medicine

## 2013-06-12 DIAGNOSIS — Z30432 Encounter for removal of intrauterine contraceptive device: Secondary | ICD-10-CM

## 2013-11-08 ENCOUNTER — Other Ambulatory Visit: Payer: Self-pay

## 2013-11-08 DIAGNOSIS — Z1231 Encounter for screening mammogram for malignant neoplasm of breast: Secondary | ICD-10-CM

## 2013-11-23 ENCOUNTER — Ambulatory Visit
Admission: RE | Admit: 2013-11-23 | Discharge: 2013-11-23 | Disposition: A | Discharge status: Home / Self Care | Payer: Managed Care, Other (non HMO) | Source: Ambulatory Visit

## 2013-11-23 DIAGNOSIS — Z1231 Encounter for screening mammogram for malignant neoplasm of breast: Secondary | ICD-10-CM

## 2014-01-10 ENCOUNTER — Other Ambulatory Visit (HOSPITAL_COMMUNITY)
Admission: RE | Admit: 2014-01-10 | Discharge: 2014-01-10 | Disposition: A | Discharge status: Home / Self Care | Payer: Managed Care, Other (non HMO) | Source: Ambulatory Visit | Attending: Family Medicine | Admitting: Family Medicine

## 2014-01-10 ENCOUNTER — Encounter: Payer: Self-pay | Admitting: Family Medicine

## 2014-01-10 ENCOUNTER — Ambulatory Visit (INDEPENDENT_AMBULATORY_CARE_PROVIDER_SITE_OTHER): Payer: Managed Care, Other (non HMO) | Admitting: Family Medicine

## 2014-01-10 VITALS — BP 136/74 | HR 80 | Temp 98.3°F | Ht 69.25 in | Wt 220.8 lb

## 2014-01-10 DIAGNOSIS — Z0000 Encounter for general adult medical examination without abnormal findings: Secondary | ICD-10-CM

## 2014-01-10 DIAGNOSIS — Z113 Encounter for screening for infections with a predominantly sexual mode of transmission: Secondary | ICD-10-CM | POA: Insufficient documentation

## 2014-01-10 DIAGNOSIS — I10 Essential (primary) hypertension: Secondary | ICD-10-CM

## 2014-01-10 DIAGNOSIS — N760 Acute vaginitis: Secondary | ICD-10-CM | POA: Diagnosis present

## 2014-01-10 DIAGNOSIS — Z01419 Encounter for gynecological examination (general) (routine) without abnormal findings: Secondary | ICD-10-CM | POA: Diagnosis not present

## 2014-01-10 DIAGNOSIS — Z8632 Personal history of gestational diabetes: Secondary | ICD-10-CM

## 2014-01-10 DIAGNOSIS — Z1151 Encounter for screening for human papillomavirus (HPV): Secondary | ICD-10-CM | POA: Diagnosis present

## 2014-01-10 DIAGNOSIS — E785 Hyperlipidemia, unspecified: Secondary | ICD-10-CM

## 2014-01-10 DIAGNOSIS — N393 Stress incontinence (female) (male): Secondary | ICD-10-CM

## 2014-01-10 LAB — COMPREHENSIVE METABOLIC PANEL
ALK PHOS: 58 U/L (ref 39–117)
ALT: 21 U/L (ref 0–35)
AST: 21 U/L (ref 0–37)
Albumin: 3.5 g/dL (ref 3.5–5.2)
BILIRUBIN TOTAL: 0.2 mg/dL (ref 0.2–1.2)
BUN: 14 mg/dL (ref 6–23)
CO2: 28 mEq/L (ref 19–32)
Calcium: 9.2 mg/dL (ref 8.4–10.5)
Chloride: 105 mEq/L (ref 96–112)
Creatinine, Ser: 1 mg/dL (ref 0.4–1.2)
GFR: 77.51 mL/min (ref >60.00)
GLUCOSE: 117 mg/dL — AB (ref 70–99)
Potassium: 4.3 mEq/L (ref 3.5–5.1)
Sodium: 140 mEq/L (ref 135–145)
Total Protein: 7.4 g/dL (ref 6.0–8.3)

## 2014-01-10 LAB — CBC WITH DIFFERENTIAL/PLATELET
BASOS PCT: 0.5 % (ref 0.0–3.0)
Basophils Absolute: 0 10*3/uL (ref 0.0–0.1)
Eosinophils Absolute: 0.1 10*3/uL (ref 0.0–0.7)
Eosinophils Relative: 1.3 % (ref 0.0–5.0)
HEMATOCRIT: 35.4 % — AB (ref 36.0–46.0)
HEMOGLOBIN: 11.4 g/dL — AB (ref 12.0–15.0)
LYMPHS PCT: 35.4 % (ref 12.0–46.0)
Lymphs Abs: 1.9 10*3/uL (ref 0.7–4.0)
MCHC: 32.3 g/dL (ref 30.0–36.0)
MCV: 93.4 fl (ref 78.0–100.0)
MONOS PCT: 7 % (ref 3.0–12.0)
Monocytes Absolute: 0.4 10*3/uL (ref 0.1–1.0)
NEUTROS ABS: 2.9 10*3/uL (ref 1.4–7.7)
Neutrophils Relative %: 55.8 % (ref 43.0–77.0)
Platelets: 317 10*3/uL (ref 150.0–400.0)
RBC: 3.79 Mil/uL — ABNORMAL LOW (ref 3.87–5.11)
RDW: 13.7 % (ref 11.5–15.5)
WBC: 5.3 10*3/uL (ref 4.0–10.5)

## 2014-01-10 LAB — LIPID PANEL
CHOL/HDL RATIO: 4
CHOLESTEROL: 205 mg/dL — AB (ref 0–200)
HDL: 54.7 mg/dL (ref >39.00)
LDL Cholesterol: 140 mg/dL — ABNORMAL HIGH (ref 0–99)
NonHDL: 150.3
Triglycerides: 54 mg/dL (ref 0.0–149.0)
VLDL: 10.8 mg/dL (ref 0.0–40.0)

## 2014-01-10 LAB — HEMOGLOBIN A1C: HEMOGLOBIN A1C: 6.3 % (ref 4.6–6.5)

## 2014-01-10 LAB — TSH: TSH: 1.22 u[IU]/mL (ref 0.35–4.50)

## 2014-01-10 NOTE — Patient Instructions (Signed)
Great to see you. Give love to your family for me.  We will call you with your lab results.

## 2014-01-10 NOTE — Assessment & Plan Note (Signed)
Recheck a1c today.

## 2014-01-10 NOTE — Assessment & Plan Note (Signed)
Reviewed preventive care protocols, scheduled due services, and updated immunizations Discussed nutrition, exercise, diet, and healthy lifestyle.  Declines influenza vaccination today. Orders Placed This Encounter  Procedures  . Lipid panel  . Comprehensive metabolic panel  . Hemoglobin A1c  . TSH  . CBC with Differential  . HIV antibody (with reflex)  . RPR

## 2014-01-10 NOTE — Assessment & Plan Note (Signed)
Recheck lipid panel today. Has been working on controlling with diet.

## 2014-01-10 NOTE — Assessment & Plan Note (Signed)
Deteriorated without detrol. No other symptoms and does not seem progressive. Advised to restart detrol. Call or return to clinic prn if these symptoms worsen or fail to improve as anticipated. The patient indicates understanding of these issues and agrees with the plan.

## 2014-01-10 NOTE — Assessment & Plan Note (Signed)
Pap smear done today.  STD screening.  Mammogram UTD.

## 2014-01-10 NOTE — Progress Notes (Signed)
Pre visit review using our clinic review tool, if applicable. No additional management support is needed unless otherwise documented below in the visit note. 

## 2014-01-10 NOTE — Progress Notes (Signed)
43 yo very pleasant female here for CPX.  G3P3, had mirena IUD which was removed.  Periods are now heavy again.  Does not feel dizzy when she stands from a seated position that she is aware of.  Husband had a vasectomy.  Saw Dr. Marvia Pickles in 06/2012- pap neg, HPV neg.  Cleared for repeat pap in 2015.  Mammogram 11/26/13.   No family h/o breast CA.  She did have benign breast mass removed when she was a teenager.  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  Weight stable.  Wt Readings from Last 3 Encounters:  01/10/14 220 lb 12 oz (100.132 kg)  06/01/13 218 lb (98.884 kg)  01/08/13 213 lb (96.616 kg)     H/o Glucose intolerance-   Does have a h/o gestational diabetes with both pregnanies, diet controlled.  Admits to drinking sweet tea and eating foods high in carbs/sugar. No increased thirst or urination.   Does have strong FH of DM.  Has noticed more tingling in her feet and back.    Lab Results  Component Value Date   HGBA1C 6.1 01/08/2013   Has had some intermittent stress incontinence.  No dysuria or hematuria.  Has not been taking detrol regularly.  Patient Active Problem List   Diagnosis Date Noted  . Unsuccessful attempt to remove intrauterine device (IUD) 06/01/2013  . Decreased hearing of both ears 10/31/2012  . Elevated blood sugar 10/31/2012  . History of gestational diabetes 10/26/2012  . Routine general medical examination at a health care facility 05/11/2011  . HYPERLIPIDEMIA 10/23/2009  . HYPERTENSION, BENIGN SYSTEMIC 06/02/2006   Past Medical History  Diagnosis Date  . Onychia and paronychia of toe   . Anemia     acute blood loss in High school  . Gestational diabetes   . PIH (pregnancy induced hypertension)     severe, required Mgs04  . Postpartum  endometritis    Past Surgical History  Procedure Laterality Date  . Cesarean section  02-04-1999     vbac 1-06 and 05-19-2005  . Cholecystectomy     History  Substance Use Topics  . Smoking status: Never Smoker   . Smokeless tobacco: Never Used  . Alcohol Use: No   Family History  Problem Relation Age of Onset  . Diabetes Mother   . Cancer Mother     breast and lung  . Hypertension Mother    No Known Allergies Current Outpatient Prescriptions on File Prior to Visit  Medication Sig Dispense Refill  . tolterodine (DETROL) 1 MG tablet Take 1 tablet (1 mg total) by mouth 2 (two) times daily.  60 tablet  0   No current facility-administered medications on file prior to visit.   The PMH, PSH, Social History, Family History, Medications, and allergies have been reviewed in [facility], and have been updated if relevant.   Review of Systems       See HPI Patient reports no  vision/ hearing changes,anorexia, weight change, fever ,adenopathy, persistant / recurrent hoarseness, swallowing issues, chest pain, edema,persistant / recurrent cough, hemoptysis, dyspnea(rest, exertional, paroxysmal nocturnal), gastrointestinal  bleeding (melena, rectal bleeding), abdominal pain, excessive heart burn, GU symptoms(dysuria, hematuria, pyuria, voiding/incontinence  Issues) syncope, focal weakness, severe memory loss, concerning skin lesions, depression, anxiety, abnormal bruising/bleeding, major  joint swelling, breast masses or abnormal vaginal bleeding.    Physical Exam BP 136/74  Pulse 80  Temp(Src) 98.3 F (36.8 C) (Oral)  Ht 5' 9.25" (1.759 m)  Wt 220 lb 12 oz (100.132 kg)  BMI 32.36 kg/m2  SpO2 98%  LMP 12/30/2013 Wt Readings from Last 3 Encounters:  01/10/14 220 lb 12 oz (100.132 kg)  06/01/13 218 lb (98.884 kg)  01/08/13 213 lb (96.616 kg)   General:  Well-developed,well-nourished,in no acute distress; alert,appropriate and cooperative throughout examination Head:  normocephalic and  atraumatic.   Eyes:  vision grossly intact, pupils equal, pupils round, and pupils reactive to light.   Ears:  R ear normal and L ear normal.   Nose:  no external deformity.   Mouth:  good dentition.   Neck:  No deformities, masses, or tenderness noted. Breasts:  No mass, nodules, thickening, tenderness, bulging, retraction, inflamation, nipple discharge or skin changes noted.   Lungs:  Normal respiratory effort, chest expands symmetrically. Lungs are clear to auscultation, no crackles or wheezes. Heart:  Normal rate and regular rhythm. S1 and S2 normal without gallop, murmur, click, rub or other extra sounds. Abdomen:  Bowel sounds positive,abdomen soft and non-tender without masses, organomegaly or hernias noted. Rectal:  no external abnormalities.   Genitalia:  Pelvic Exam:        External: normal female genitalia without lesions or masses        Vagina: normal without lesions or masses        Cervix: normal without lesions or masses        Adnexa: normal bimanual exam without masses or fullness        Uterus: normal by palpation        Pap smear: performed Msk:  No deformity or scoliosis noted of thoracic or lumbar spine.   Extremities:  No clubbing, cyanosis, edema, or deformity noted with normal full range of motion of all joints.   Neurologic:  alert & oriented X3 and gait normal.   Skin:  Intact without suspicious lesions or rashes Cervical Nodes:  No lymphadenopathy noted Axillary Nodes:  No palpable lymphadenopathy Psych:  Cognition and judgment appear intact. Alert and cooperative with normal attention span and concentration. No apparent delusions, illusions, hallucinations

## 2014-01-11 ENCOUNTER — Telehealth: Payer: Self-pay | Admitting: Family Medicine

## 2014-01-11 LAB — HIV ANTIBODY (ROUTINE TESTING W REFLEX): HIV 1&2 Ab, 4th Generation: NONREACTIVE

## 2014-01-11 LAB — RPR

## 2014-01-11 NOTE — Telephone Encounter (Signed)
emmi emailed °

## 2014-01-14 ENCOUNTER — Encounter: Payer: Self-pay | Admitting: *Deleted

## 2014-01-14 LAB — CERVICOVAGINAL ANCILLARY ONLY: HERPES (WINDOWPATH): NEGATIVE

## 2014-01-14 LAB — CYTOLOGY - PAP

## 2014-01-15 LAB — CERVICOVAGINAL ANCILLARY ONLY
BACTERIAL VAGINITIS: NEGATIVE
Candida vaginitis: NEGATIVE

## 2014-02-04 ENCOUNTER — Encounter: Payer: Self-pay | Admitting: Family Medicine

## 2014-02-18 ENCOUNTER — Encounter: Payer: Self-pay | Admitting: Family Medicine

## 2014-02-18 ENCOUNTER — Ambulatory Visit (INDEPENDENT_AMBULATORY_CARE_PROVIDER_SITE_OTHER): Payer: Managed Care, Other (non HMO) | Admitting: Family Medicine

## 2014-02-18 VITALS — BP 144/98 | HR 96 | Temp 98.5°F | Wt 221.5 lb

## 2014-02-18 DIAGNOSIS — G4700 Insomnia, unspecified: Secondary | ICD-10-CM

## 2014-02-18 DIAGNOSIS — R0683 Snoring: Secondary | ICD-10-CM | POA: Insufficient documentation

## 2014-02-18 DIAGNOSIS — R51 Headache: Secondary | ICD-10-CM

## 2014-02-18 DIAGNOSIS — R519 Headache, unspecified: Secondary | ICD-10-CM

## 2014-02-18 NOTE — Assessment & Plan Note (Signed)
Given history of snoring, fatigue and elevated BP, will refer for sleep study to rule out sleep apnea.  Could also be leading to her headaches. Referral placed.  The patient indicates understanding of these issues and agrees with the plan.

## 2014-02-18 NOTE — Assessment & Plan Note (Signed)
>  25 min spent with patient, at least half of which was spent on counseling insomnia.  The problem of recurrent insomnia is discussed. Avoidance of caffeine sources is strongly encouraged. Sleep hygiene issues are reviewed.  Advised OTC benadryl. Call or return to clinic prn if these symptoms worsen or fail to improve as anticipated. The patient indicates understanding of these issues and agrees with the plan.

## 2014-02-18 NOTE — Progress Notes (Signed)
Pre visit review using our clinic review tool, if applicable. No additional management support is needed unless otherwise documented below in the visit note. 

## 2014-02-18 NOTE — Patient Instructions (Addendum)
Great to see you. Please keep a headache journal.  I will refer you for sleep study.  Try OTC benadryl at bedtime.

## 2014-02-18 NOTE — Progress Notes (Signed)
Subjective:   Patient ID: Barbara [name], female    DOB: November 12, 1970, 43 y.o.   MRN: [id number]  Barbara [name] is a pleasant 43 y.o. year old female who presents to clinic today with Headache  on 02/18/2014  HPI:  Headaches- for more than a month, having a daily, "nagging" headache. Frontal, bilateral.  Not associated with photophobia, nausea, vomiting or phonophobia.  Does not wake her from sleep.  No dysarthria or other focal neurological symptoms. Ibuprofen does help. She has been under more stress lately.  Also does snore, sometimes wakes herself up. Tired, also has difficulty falling asleep.  Current Outpatient Prescriptions on File Prior to Visit  Medication Sig Dispense Refill  . tolterodine (DETROL) 1 MG tablet Take 1 tablet (1 mg total) by mouth 2 (two) times daily. 60 tablet 0   No current facility-administered medications on file prior to visit.    No Known Allergies  Past Medical History  Diagnosis Date  . Onychia and paronychia of toe   . Anemia     acute blood loss in High school  . Gestational diabetes   . PIH (pregnancy induced hypertension)     severe, required Mgs04  . Postpartum endometritis     Past Surgical History  Procedure Laterality Date  . Cesarean section  02-04-1999     vbac 1-06 and 05-19-2005  . Cholecystectomy      Family History  Problem Relation Age of Onset  . Diabetes Mother   . Cancer Mother     breast and lung  . Hypertension Mother     History   Social History  . Marital Status: Married    Spouse Name: N/A    Number of Children: 3  . Years of Education: N/A   Occupational History  . math teacher    Social History Main Topics  . Smoking status: Never Smoker   . Smokeless tobacco: Never Used  . Alcohol Use: No  . Drug Use: No  . Sexual Activity:    Partners: Male    Birth Control/ Protection: IUD     Comment: MIRENA PLACED 05/2008 (PCP)   Other Topics Concern  . Not on file   Social History  Narrative   Married to [place], had son [name] 11-00, jayden, and skylar   The PMH, PSH, Social History, Family History, Medications, and allergies have been reviewed in [facility], and have been updated if relevant.    Review of Systems  Constitutional: Positive for fatigue.  Neurological: Positive for headaches. Negative for dizziness, tremors, seizures, syncope, facial asymmetry, speech difficulty, weakness, light-headedness and numbness.  Psychiatric/Behavioral: Positive for sleep disturbance. Negative for suicidal ideas.  All other systems reviewed and are negative.      Objective:    BP 144/98 mmHg  Pulse 96  Temp(Src) 98.5 F (36.9 C) (Oral)  Wt 221 lb 8 oz (100.472 kg)  SpO2 97%  LMP 01/29/2014 (Within Days)   Physical Exam  Constitutional: She is oriented to person, place, and time. She appears well-developed and well-nourished. No distress.  HENT:  Head: Normocephalic.  Neck: Normal range of motion.  Cardiovascular: Normal rate.   Pulmonary/Chest: Effort normal.  Neurological: She is alert and oriented to person, place, and time. No cranial nerve deficit. Coordination normal.  Skin: Skin is warm and dry.  Psychiatric: She has a normal mood and affect. Her behavior is normal. Judgment and thought content normal.  Nursing note and vitals reviewed.  Assessment & Plan:   Snoring - Plan: Ambulatory referral to Pulmonology  Frequent headaches No Follow-up on file.

## 2014-02-18 NOTE — Assessment & Plan Note (Signed)
Likely tension headaches. Advised to keep HA journal. She will update me in a few weeks. Advised not taking NSAIDs daily as this can lead to rebound headaches.

## 2014-03-19 ENCOUNTER — Institutional Professional Consult (permissible substitution): Payer: Managed Care, Other (non HMO) | Admitting: Pulmonary Disease

## 2014-05-01 ENCOUNTER — Institutional Professional Consult (permissible substitution): Payer: Managed Care, Other (non HMO) | Admitting: Pulmonary Disease

## 2014-11-28 ENCOUNTER — Encounter: Payer: Self-pay | Admitting: Family Medicine

## 2014-11-28 ENCOUNTER — Telehealth: Payer: Self-pay | Admitting: Family Medicine

## 2014-11-28 ENCOUNTER — Telehealth: Payer: Self-pay

## 2014-11-28 ENCOUNTER — Ambulatory Visit (INDEPENDENT_AMBULATORY_CARE_PROVIDER_SITE_OTHER): Payer: Managed Care, Other (non HMO) | Admitting: Family Medicine

## 2014-11-28 VITALS — BP 132/86 | HR 94 | Temp 98.6°F | Wt 224.8 lb

## 2014-11-28 DIAGNOSIS — R739 Hyperglycemia, unspecified: Secondary | ICD-10-CM

## 2014-11-28 DIAGNOSIS — R079 Chest pain, unspecified: Secondary | ICD-10-CM

## 2014-11-28 DIAGNOSIS — R7309 Other abnormal glucose: Secondary | ICD-10-CM

## 2014-11-28 LAB — COMPREHENSIVE METABOLIC PANEL
ALBUMIN: 4 g/dL (ref 3.5–5.2)
ALT: 16 U/L (ref 0–35)
AST: 18 U/L (ref 0–37)
Alkaline Phosphatase: 66 U/L (ref 39–117)
BUN: 11 mg/dL (ref 6–23)
CHLORIDE: 103 meq/L (ref 96–112)
CO2: 29 meq/L (ref 19–32)
CREATININE: 0.86 mg/dL (ref 0.40–1.20)
Calcium: 9.4 mg/dL (ref 8.4–10.5)
GFR: 92.91 mL/min (ref >60.00)
Glucose, Bld: 103 mg/dL — ABNORMAL HIGH (ref 70–99)
Potassium: 4.1 mEq/L (ref 3.5–5.1)
Sodium: 138 mEq/L (ref 135–145)
Total Bilirubin: 0.2 mg/dL (ref 0.2–1.2)
Total Protein: 7.2 g/dL (ref 6.0–8.3)

## 2014-11-28 LAB — HEMOGLOBIN A1C: Hgb A1c MFr Bld: 6.4 % (ref 4.6–6.5)

## 2014-11-28 NOTE — Assessment & Plan Note (Signed)
New- EKG and history reassuring. EKG NSR and ran last night without any CP or SOB- unlikely cardiac. ? Panic attack due to increased stressors at work. Advised breathing techniques and hopefully anxiety will lessen once she turns in her letter of resignation. Call or return to clinic prn if these symptoms worsen or fail to improve as anticipated. The patient indicates understanding of these issues and agrees with the plan.  Check a1c today.  Normotensive- reassurance provided.

## 2014-11-28 NOTE — Telephone Encounter (Signed)
Pt has appt with Dr Deborra Medina on 11/28/14 at 9 AM.

## 2014-11-28 NOTE — Telephone Encounter (Signed)
PLEASE NOTE: All timestamps contained within this report are represented as [place] Standard Time. CONFIDENTIALTY NOTICE: This fax transmission is intended only for the addressee. It contains information that is legally privileged, confidential or otherwise protected from use or disclosure. If you are not the intended recipient, you are strictly prohibited from reviewing, disclosing, copying using or disseminating any of this information or taking any action in reliance on or regarding this information. If you have received this fax in error, please notify us immediately by telephone so that we can arrange for its return to [place]. Phone: [phone], Toll-Free: [phone], Fax: [phone] Page: 1 of 2 Call Id: [id number] [place] Patient Name: Barbara [name] Gender: Female DOB: [birth year] Age: 44 Y 32 M 2 D Return Phone Number: [phone] (Primary) Address: City/State/Zip: [place] Client [place] Night - Client Client Site [place] Phys[facility] Arnette Norris Contact Type Call Call Type Triage / Clinical Relationship To Patient Self Return Phone Number [phone] (Primary) Chief Complaint Headache Initial Comment caller states she is feeling lightheaded had blurry vision yesterday - this am has a bad headache PreDisposition Call Doctor Nurse Assessment Nurse: Mechele Dawley, RN, Amy Date/Time Eilene Ghazi Time): 11/28/2014 7:21:07 AM Confirm and document reason for call. If symptomatic, describe symptoms. ---YESTERDAY HEADACHE, LIGHTHEADED, BLURRY VISION. SHE WENT JOGGING. TODAY HEADACHE. SHE NEEDS TO GET HER BP CHECKED AS WELL. CHEST PAIN ON SATURDAY AS WELL. SHE DOES NOT HAVE HIGH BP. SHE DOES ON OCCASION AND DOES KNOW THAT IT HAS BEEN HIGH. SHE STATES THAT WHEN SHE SAT DOWN AND ATE SHE FELT BETTER WITH THE BLURRY VISION AND LIGHT HEADED FEELING  YESTERDAY. Has the patient traveled out of the country within the last 30 days? ---Not Applicable Does the patient require triage? ---Yes Related visit to physician within the last 2 weeks? ---No Does the PT have any chronic conditions? (i.e. diabetes, asthma, etc.) ---No Did the patient indicate they were pregnant? ---No Guidelines Guideline Title Affirmed Question Affirmed Notes Nurse Date/Time (Eastern Time) Headache [1] SEVERE headache (e.g., excruciating) AND [2] "worst headache" of life 7/10 Pella, RN, Amy 11/28/2014 7:22:07 AM Disp. Time Eilene Ghazi Time) Disposition Final User 11/28/2014 7:24:20 AM Go to ED Now (or PCP triage) Yes Mechele Dawley, RN, Amy Caller Understands: Yes PLEASE NOTE: All timestamps contained within this report are represented as [place] Standard Time. CONFIDENTIALTY NOTICE: This fax transmission is intended only for the addressee. It contains information that is legally privileged, confidential or otherwise protected from use or disclosure. If you are not the intended recipient, you are strictly prohibited from reviewing, disclosing, copying using or disseminating any of this information or taking any action in reliance on or regarding this information. If you have received this fax in error, please notify us immediately by telephone so that we can arrange for its return to [place]. Phone: [phone], Toll-Free: [phone], Fax: [phone] Page: 2 of 2 Call Id: [id number] Disagree/Comply: Comply Care Advice Given Per Guideline GO TO ED NOW (OR PCP TRIAGE): IBUPROFEN (E.G., MOTRIN, ADVIL): DRIVING: Another adult should drive. CARE ADVICE given per Headache (Adult) guideline. After Care Instructions Given Call Event Type User Date / Time Description Referrals REFERRED TO PCP OFFICE

## 2014-11-28 NOTE — Progress Notes (Signed)
Subjective:   Patient ID: Barbara [name], female    DOB: Dec 15, 1970, 44 y.o.   MRN: [id number]  Barbara [name] is a pleasant 44 y.o. year old female who presents to clinic today with Headache and Chest Pain  on 11/28/2014  HPI:  Work has been very stressful.  Typed out her letter of resignation but has not sent it.  New head of school.  Barbara [name] constantly feels anxious at or when she thinks about work.  Over the weekend had right sided chest pain while she was sitting at her computer and reading an email from work. No associated SOB, diaphoresis, nausea or vomiting. Symptoms resolved spontaneously and has not reoccurred.  Last night went running with out any CP or SOB. This morning, she woke up with a HA and was concerned maybe her blood pressure was elevated.   Lab Results  Component Value Date   HGBA1C 6.3 01/10/2014   No current outpatient prescriptions on file prior to visit.   No current facility-administered medications on file prior to visit.    No Known Allergies  Past Medical History  Diagnosis Date  . Onychia and paronychia of toe   . Anemia     acute blood loss in High school  . Gestational diabetes   . PIH (pregnancy induced hypertension)     severe, required Mgs04  . Postpartum endometritis     Past Surgical History  Procedure Laterality Date  . Cesarean section  02-04-1999     vbac 1-06 and 05-19-2005  . Cholecystectomy      Family History  Problem Relation Age of Onset  . Diabetes Mother   . Cancer Mother     breast and lung  . Hypertension Mother     Social History   Social History  . Marital Status: Married    Spouse Name: N/A  . Number of Children: 3  . Years of Education: N/A   Occupational History  . math teacher    Social History Main Topics  . Smoking status: Never Smoker   . Smokeless tobacco: Never Used  . Alcohol Use: No  . Drug Use: No  . Sexual Activity:    Partners: Male    Birth Control/ Protection: IUD      Comment: MIRENA PLACED 05/2008 (PCP)   Other Topics Concern  . Not on file   Social History Narrative   Married to [place], had son [name] 11-00, jayden, and skylar   The PMH, PSH, Social History, Family History, Medications, and allergies have been reviewed in [facility], and have been updated if relevant.  Review of Systems  Constitutional: Negative.   Eyes: Negative.   Respiratory: Negative.   Cardiovascular: Positive for chest pain. Negative for palpitations and leg swelling.  Endocrine: Negative.   Genitourinary: Negative.   Musculoskeletal: Negative.   Skin: Negative.   Allergic/Immunologic: Negative.   Neurological: Positive for headaches. Negative for dizziness, tremors, seizures, syncope, facial asymmetry, speech difficulty, weakness, light-headedness and numbness.  Hematological: Negative.   Psychiatric/Behavioral: Negative for suicidal ideas, hallucinations, behavioral problems, confusion, sleep disturbance, self-injury, dysphoric mood, decreased concentration and agitation. The patient is nervous/anxious. The patient is not hyperactive.   All other systems reviewed and are negative.      Objective:    BP 132/86 mmHg  Pulse 94  Temp(Src) 98.6 F (37 C) (Oral)  Wt 224 lb 12 oz (101.946 kg)  SpO2 97%  LMP 11/23/2014   Physical Exam  Constitutional: She is oriented  to person, place, and time. She appears well-developed and well-nourished. No distress.  HENT:  Head: Normocephalic and atraumatic.  Eyes: Conjunctivae are normal.  Neck: Normal range of motion.  Cardiovascular: Normal rate, regular rhythm and normal heart sounds.   Pulmonary/Chest: Effort normal and breath sounds normal. No respiratory distress.  Musculoskeletal: Normal range of motion. She exhibits no edema.  Neurological: She is alert and oriented to person, place, and time. No cranial nerve deficit.  Skin: Skin is warm and dry.  Psychiatric: She has a normal mood and affect. Her  behavior is normal. Thought content normal.  Nursing note and vitals reviewed.         Assessment & Plan:   Chest pain, unspecified chest pain type - Plan: EKG 12-Lead  Elevated blood sugar - Plan: Hemoglobin A1c, Comprehensive metabolic panel No Follow-up on file.

## 2014-11-28 NOTE — Progress Notes (Signed)
Pre visit review using our clinic review tool, if applicable. No additional management support is needed unless otherwise documented below in the visit note. 

## 2014-12-17 ENCOUNTER — Other Ambulatory Visit: Payer: Self-pay

## 2014-12-17 DIAGNOSIS — Z1231 Encounter for screening mammogram for malignant neoplasm of breast: Secondary | ICD-10-CM

## 2014-12-24 ENCOUNTER — Ambulatory Visit (INDEPENDENT_AMBULATORY_CARE_PROVIDER_SITE_OTHER): Payer: Managed Care, Other (non HMO) | Admitting: Family Medicine

## 2014-12-24 ENCOUNTER — Ambulatory Visit (INDEPENDENT_AMBULATORY_CARE_PROVIDER_SITE_OTHER): Payer: Managed Care, Other (non HMO)

## 2014-12-24 VITALS — BP 142/90 | HR 83 | Temp 99.7°F | Resp 18 | Ht 69.0 in | Wt 222.1 lb

## 2014-12-24 DIAGNOSIS — M25562 Pain in left knee: Secondary | ICD-10-CM | POA: Diagnosis not present

## 2014-12-24 MED ORDER — NAPROXEN 500 MG PO TABS: 500.0000 mg | ORAL_TABLET | Freq: Two times a day (BID) | ORAL | Status: DC

## 2014-12-24 NOTE — Patient Instructions (Addendum)
Please return to clinic in 10 days to have your knee re-examined.  Please see Philis Fendt PA-C or Dr. Carlota Raspberry if possible.   Elastic Bandage and RICE Elastic bandages come in different shapes and sizes. They perform different functions. Your caregiver will help you to decide what is best for your protection, recovery, or rehabilitation following an injury. The following are some general tips to help you use an elastic bandage.  Use the bandage as directed by the maker of the bandage you are using.  Do not wrap it too tight. This may cut off the circulation of the arm or leg below the bandage.  If part of your body beyond the bandage becomes blue, numb, or swollen, it is too tight. Loosen the bandage as needed to prevent these problems.  See your caregiver or trainer if the bandage seems to be making your problems worse rather than better. Bandages may be a reminder to you that you have an injury. However, they provide very little support. The few pounds of support they provide are minor considering the pressure it takes to injure a joint or tear ligaments. Therefore, the joint will not be able to handle all of the wear and tear it could before the injury. The routine care of many injuries includes Rest, Ice, Compression, and Elevation (RICE).  Rest is required to allow your body to heal. Generally, routine activities can be resumed when comfortable. Injured tendons and bones take about 6 weeks to heal.  Icing the injury helps keep the swelling down and reduces pain. Do not apply ice directly to the skin. Put ice in a plastic bag. Place a towel between the skin and the bag. This will prevent frostbite to the skin. Apply ice bags to the injured area for 15-20 minutes, every 2 hours while awake. Do this for the first 24 to 48 hours, then as directed by your caregiver.  Compression helps keep swelling down, gives support, and helps with discomfort. If an elastic bandage has been applied today, it  should be removed and reapplied every 3 to 4 hours. It should not be applied tightly, but firmly enough to keep swelling down. Watch fingers or toes for swelling, bluish discoloration, coldness, numbness, or increased pain. If any of these problems occur, remove the bandage and reapply it more loosely. If these problems persist, contact your caregiver.  Elevation helps reduce swelling and decreases pain. The injured area (arms, hands, legs, or feet) should be placed near to or above the heart (center of the chest) if able. Persistent pain and inability to use the injured area for more than 2 to 3 days are warning signs. You should see a caregiver for a follow-up visit as soon as possible. Initially, a minor broken bone (hairline fracture) may not be seen on X-rays. It may take 7 to 10 days to finally show up. Continued pain and swelling show that further evaluation and/or X-rays are needed. Make a follow-up visit with your caregiver. A specialist in reading X-rays (radiologist) will read your X-rays again. Finding out the results of your test Not all test results are available during your visit. If your test results are not back during the visit, make an appointment with your caregiver to find out the results. Do not assume everything is normal if you have not heard from your caregiver or the medical facility. It is important for you to follow up on all of your test results. Document Released: 09/11/2001 Document Revised: 06/14/2011 Document Reviewed:  07/24/2007 ExitCare Patient Information 2015 [place], [place]. This information is not intended to replace advice given to you by your health care provider. Make sure you discuss any questions you have with your health care provider.

## 2014-12-24 NOTE — Progress Notes (Signed)
12/25/2014 at 12:32 AM  Barbara [name] / DOB: [birth year] / MRN: [id number]  The patient has HLD (hyperlipidemia); HYPERTENSION, BENIGN SYSTEMIC; History of gestational diabetes; Decreased hearing of both ears; Elevated blood sugar; Stress incontinence in female; Encounter for routine gynecological examination; Frequent headaches; Insomnia; and Chest pain on her problem list.  SUBJECTIVE  Barbara [name] is a 44 y.o. well appearing female presenting for the chief complaint of left knee pain that started after jumping up for a rebound. She was able to walk off the court after the injury. Reports she felt "a pop" and since the injury has had pain with ambulation, mild swelling, clicking, popping and catching of the joint.  She has not tried anything for pain.      She  has a past medical history of Onychia and paronychia of toe; Anemia; Gestational diabetes; PIH (pregnancy induced hypertension); and Postpartum endometritis.    Medications reviewed and updated by myself where necessary, and exist elsewhere in the encounter.   [name] has No Known Allergies. She  reports that she has never smoked. She has never used smokeless tobacco. She reports that she does not drink alcohol or use illicit drugs. She  reports that she currently engages in sexual activity and has had female partners. She reports using the following method of birth control/protection: IUD. The patient  has past surgical history that includes Cesarean section (02-04-1999); Cholecystectomy; and Breast surgery.  Her family history includes Cancer in her mother; Diabetes in her mother; Hypertension in her mother.  Review of Systems  Constitutional: Negative for fever and chills.  Respiratory: Negative for shortness of breath.   Cardiovascular: Negative for chest pain.  Gastrointestinal: Negative for nausea and abdominal pain.  Genitourinary: Negative.   Musculoskeletal: Positive for joint pain. Negative for falls.  Skin:  Negative for rash.  Neurological: Negative for dizziness and headaches.    OBJECTIVE  Her  height is 5\' 9"  (1.753 m) and weight is 222 lb 2 oz (100.755 kg). Her oral temperature is 99.7 F (37.6 C). Her blood pressure is 142/90 and her pulse is 83. Her respiration is 18 and oxygen saturation is 97%.  The patient's body mass index is 32.79 kg/(m^2).  Physical Exam  Constitutional: She is oriented to person, place, and time. She appears well-developed and well-nourished. No distress.  Eyes: Pupils are equal, round, and reactive to light.  Cardiovascular: Normal rate.   Respiratory: Effort normal. No respiratory distress.  GI: She exhibits no distension.  Musculoskeletal:       Right knee: Normal.       Left knee: She exhibits decreased range of motion and swelling (mild). She exhibits no effusion, no deformity, no erythema, no LCL laxity, normal patellar mobility and no MCL laxity. Tenderness found. Lateral joint line and LCL tenderness noted. No medial joint line, no MCL and no patellar tendon tenderness noted.       Legs: Neurological: She is alert and oriented to person, place, and time.  Skin: Skin is warm and dry. She is not diaphoretic.  Psychiatric: She has a normal mood and affect. Her behavior is normal. Judgment and thought content normal.   [facility] reading (PRIMARY) by  Dr. Carlota Raspberry: 4 views obtained. Mild DJD present medially. Trace effusion noted. Neg for fracture.     No results found for this or any previous visit (from the past 24 hour(s)).  ASSESSMENT & PLAN  Barbara [name] was seen today for knee injury.  Diagnoses and all orders  for this visit:  Left knee pain: Likely meniscal in nature. Ligament intact on exam.  Patient to wear immobilizer while ambulating.  NSAIDs for 10 days and to RTC for re-evaluation and will consider MR at that time if symptoms persist.    -     DG Knee Complete 4 Views Left; Future -     naproxen (NAPROSYN) 500 MG tablet; Take 1 tablet (500 mg total)  by mouth 2 (two) times daily with a meal. -     Knee immobilizer    The patient was advised to call or come back to clinic if she does not see an improvement in symptoms, or worsens with the above plan.   Philis Fendt, MHS, PA-C [facility] 12/25/2014 12:32 AM

## 2014-12-25 NOTE — Progress Notes (Signed)
Xray read and patient discussed with Mr. Carlis Abbott. Agree with assessment and plan of care per his note.

## 2015-01-02 ENCOUNTER — Other Ambulatory Visit: Payer: Self-pay | Admitting: Family Medicine

## 2015-01-02 DIAGNOSIS — Z01419 Encounter for gynecological examination (general) (routine) without abnormal findings: Secondary | ICD-10-CM | POA: Insufficient documentation

## 2015-01-03 ENCOUNTER — Ambulatory Visit (INDEPENDENT_AMBULATORY_CARE_PROVIDER_SITE_OTHER): Payer: Managed Care, Other (non HMO) | Admitting: Physician Assistant

## 2015-01-03 VITALS — BP 132/84 | HR 76 | Temp 99.1°F | Resp 16 | Ht 69.0 in | Wt 220.6 lb

## 2015-01-03 DIAGNOSIS — M25562 Pain in left knee: Secondary | ICD-10-CM

## 2015-01-03 NOTE — Progress Notes (Signed)
01/03/2015 at 3:37 PM  Barbara [name] / DOB: October 27, 1970 / MRN: [id number]  The patient has HLD (hyperlipidemia); HYPERTENSION, BENIGN SYSTEMIC; History of gestational diabetes; Decreased hearing of both ears; Elevated blood sugar; Stress incontinence in female; Encounter for routine gynecological examination; Frequent headaches; Insomnia; Chest pain; and Well woman exam on her problem list.  SUBJECTIVE  Barbara [name] is a 44 y.o. well appearing female presenting for the follow up of left knee pain treated with compression, a knee immobilizer and naproxen.  She took naproxen as needed and did not schedule the medication.  She feels her knee is 85% better today. Continues to complain of some clicking and popping.    She  has a past medical history of Onychia and paronychia of toe; Anemia; Gestational diabetes; PIH (pregnancy induced hypertension); and Postpartum endometritis.    Medications reviewed and updated by myself where necessary, and exist elsewhere in the encounter.   Barbara [name] has No Known Allergies. She  reports that she has never smoked. She has never used smokeless tobacco. She reports that she does not drink alcohol or use illicit drugs. She  reports that she currently engages in sexual activity and has had female partners. She reports using the following method of birth control/protection: IUD. The patient  has past surgical history that includes Cesarean section (02-04-1999); Cholecystectomy; and Breast surgery.  Her family history includes Cancer in her mother; Diabetes in her mother; Hypertension in her mother.  Review of Systems  Constitutional: Negative for fever and chills.  Respiratory: Negative for shortness of breath.   Cardiovascular: Negative for chest pain.  Gastrointestinal: Negative for nausea and abdominal pain.  Genitourinary: Negative.   Musculoskeletal: Positive for joint pain.  Skin: Negative for rash.  Neurological: Negative for dizziness and  headaches.    OBJECTIVE  Her  height is 5\' 9"  (1.753 m) and weight is 220 lb 9.6 oz (100.064 kg). Her oral temperature is 99.1 F (37.3 C). Her blood pressure is 132/84 and her pulse is 76. Her respiration is 16 and oxygen saturation is 98%.  The patient's body mass index is 32.56 kg/(m^2).  Physical Exam  Constitutional: She is oriented to person, place, and time. She appears well-developed and well-nourished. No distress.  Eyes: Pupils are equal, round, and reactive to light.  Cardiovascular: Normal rate.   Respiratory: Effort normal. No respiratory distress.  GI: She exhibits no distension.  Musculoskeletal:       Right knee: Normal.       Left knee: She exhibits swelling (mild). She exhibits normal range of motion, no effusion, no deformity, no erythema, no LCL laxity, normal patellar mobility and no MCL laxity. No tenderness found. No medial joint line, no lateral joint line, no MCL, no LCL and no patellar tendon tenderness noted.       Legs: Neurological: She is alert and oriented to person, place, and time.  Skin: Skin is warm and dry. She is not diaphoretic.  Psychiatric: She has a normal mood and affect. Her behavior is normal. Judgment and thought content normal.    No results found for this or any previous visit (from the past 24 hour(s)).  ASSESSMENT & PLAN  Barbara [name] was seen today for follow-up.  Diagnoses and all orders for this visit:  Left knee pain: Patient improving despite missing NSAIDs.  Advised that she start a 7 day course and wear the brace only if needed.  She has a follow up with her primary in the middle  of this month and her primary has access to an orthopedist in her office. I feel this problem will resolve and will not need further imaging.  She is to return to her PCP as needed for this problem in the future, or return to this office.    The patient was advised to call or come back to clinic if she does not see an improvement in symptoms, or worsens  with the above plan.   Philis Fendt, MHS, PA-C [facility] 01/03/2015 3:37 PM

## 2015-01-06 ENCOUNTER — Ambulatory Visit
Admission: RE | Admit: 2015-01-06 | Discharge: 2015-01-06 | Disposition: A | Discharge status: Home / Self Care | Payer: Managed Care, Other (non HMO) | Source: Ambulatory Visit

## 2015-01-06 DIAGNOSIS — Z1231 Encounter for screening mammogram for malignant neoplasm of breast: Secondary | ICD-10-CM

## 2015-01-08 ENCOUNTER — Other Ambulatory Visit: Payer: Self-pay | Admitting: Family Medicine

## 2015-01-08 DIAGNOSIS — R928 Other abnormal and inconclusive findings on diagnostic imaging of breast: Secondary | ICD-10-CM

## 2015-01-10 ENCOUNTER — Other Ambulatory Visit (INDEPENDENT_AMBULATORY_CARE_PROVIDER_SITE_OTHER): Payer: Managed Care, Other (non HMO)

## 2015-01-10 DIAGNOSIS — Z0000 Encounter for general adult medical examination without abnormal findings: Secondary | ICD-10-CM | POA: Diagnosis not present

## 2015-01-10 DIAGNOSIS — Z01419 Encounter for gynecological examination (general) (routine) without abnormal findings: Secondary | ICD-10-CM

## 2015-01-10 LAB — CBC WITH DIFFERENTIAL/PLATELET
BASOS ABS: 0 10*3/uL (ref 0.0–0.1)
Basophils Relative: 0.5 % (ref 0.0–3.0)
EOS ABS: 0.1 10*3/uL (ref 0.0–0.7)
Eosinophils Relative: 1.1 % (ref 0.0–5.0)
HEMATOCRIT: 33.4 % — AB (ref 36.0–46.0)
HEMOGLOBIN: 10.9 g/dL — AB (ref 12.0–15.0)
LYMPHS PCT: 32.9 % (ref 12.0–46.0)
Lymphs Abs: 1.9 10*3/uL (ref 0.7–4.0)
MCHC: 32.8 g/dL (ref 30.0–36.0)
MCV: 88 fl (ref 78.0–100.0)
MONOS PCT: 8 % (ref 3.0–12.0)
Monocytes Absolute: 0.5 10*3/uL (ref 0.1–1.0)
Neutro Abs: 3.3 10*3/uL (ref 1.4–7.7)
Neutrophils Relative %: 57.5 % (ref 43.0–77.0)
Platelets: 342 10*3/uL (ref 150.0–400.0)
RBC: 3.8 Mil/uL — AB (ref 3.87–5.11)
RDW: 15 % (ref 11.5–15.5)
WBC: 5.7 10*3/uL (ref 4.0–10.5)

## 2015-01-10 LAB — LIPID PANEL
CHOL/HDL RATIO: 4
Cholesterol: 186 mg/dL (ref 0–200)
HDL: 50.2 mg/dL (ref >39.00)
LDL Cholesterol: 126 mg/dL — ABNORMAL HIGH (ref 0–99)
NonHDL: 135.93
TRIGLYCERIDES: 51 mg/dL (ref 0.0–149.0)
VLDL: 10.2 mg/dL (ref 0.0–40.0)

## 2015-01-10 LAB — COMPREHENSIVE METABOLIC PANEL
ALBUMIN: 4.1 g/dL (ref 3.5–5.2)
ALK PHOS: 58 U/L (ref 39–117)
ALT: 18 U/L (ref 0–35)
AST: 17 U/L (ref 0–37)
BILIRUBIN TOTAL: 0.4 mg/dL (ref 0.2–1.2)
BUN: 17 mg/dL (ref 6–23)
CALCIUM: 9.1 mg/dL (ref 8.4–10.5)
CO2: 26 mEq/L (ref 19–32)
CREATININE: 1.03 mg/dL (ref 0.40–1.20)
Chloride: 104 mEq/L (ref 96–112)
GFR: 75.41 mL/min (ref >60.00)
Glucose, Bld: 113 mg/dL — ABNORMAL HIGH (ref 70–99)
Potassium: 4 mEq/L (ref 3.5–5.1)
Sodium: 138 mEq/L (ref 135–145)
TOTAL PROTEIN: 7.5 g/dL (ref 6.0–8.3)

## 2015-01-10 LAB — TSH: TSH: 1.8 u[IU]/mL (ref 0.35–4.50)

## 2015-01-10 LAB — HEMOGLOBIN A1C: HEMOGLOBIN A1C: 6.2 % (ref 4.6–6.5)

## 2015-01-14 ENCOUNTER — Encounter: Payer: Self-pay | Admitting: Family Medicine

## 2015-01-14 ENCOUNTER — Other Ambulatory Visit (HOSPITAL_COMMUNITY)
Admission: RE | Admit: 2015-01-14 | Discharge: 2015-01-14 | Disposition: A | Discharge status: Home / Self Care | Payer: Managed Care, Other (non HMO) | Source: Ambulatory Visit | Attending: Family Medicine | Admitting: Family Medicine

## 2015-01-14 ENCOUNTER — Ambulatory Visit (INDEPENDENT_AMBULATORY_CARE_PROVIDER_SITE_OTHER): Payer: Managed Care, Other (non HMO) | Admitting: Family Medicine

## 2015-01-14 VITALS — BP 130/84 | HR 72 | Temp 98.5°F | Ht 69.0 in | Wt 218.5 lb

## 2015-01-14 DIAGNOSIS — R7309 Other abnormal glucose: Secondary | ICD-10-CM

## 2015-01-14 DIAGNOSIS — Z0000 Encounter for general adult medical examination without abnormal findings: Secondary | ICD-10-CM

## 2015-01-14 DIAGNOSIS — Z1151 Encounter for screening for human papillomavirus (HPV): Secondary | ICD-10-CM | POA: Insufficient documentation

## 2015-01-14 DIAGNOSIS — Z01419 Encounter for gynecological examination (general) (routine) without abnormal findings: Secondary | ICD-10-CM | POA: Insufficient documentation

## 2015-01-14 DIAGNOSIS — Z113 Encounter for screening for infections with a predominantly sexual mode of transmission: Secondary | ICD-10-CM

## 2015-01-14 DIAGNOSIS — I10 Essential (primary) hypertension: Secondary | ICD-10-CM

## 2015-01-14 DIAGNOSIS — D649 Anemia, unspecified: Secondary | ICD-10-CM | POA: Insufficient documentation

## 2015-01-14 DIAGNOSIS — Z124 Encounter for screening for malignant neoplasm of cervix: Secondary | ICD-10-CM

## 2015-01-14 DIAGNOSIS — R739 Hyperglycemia, unspecified: Secondary | ICD-10-CM

## 2015-01-14 DIAGNOSIS — E785 Hyperlipidemia, unspecified: Secondary | ICD-10-CM | POA: Diagnosis not present

## 2015-01-14 NOTE — Progress Notes (Signed)
Pre visit review using our clinic review tool, if applicable. No additional management support is needed unless otherwise documented below in the visit note. 

## 2015-01-14 NOTE — Patient Instructions (Signed)
Great to see you. Please start taking a prenatal vitamin daily like we discussed today.

## 2015-01-14 NOTE — Progress Notes (Signed)
44 yo very pleasant female here for CPX.  G3P3, had mirena IUD which was removed. Husband has had a vasectomy. Periods are now heavy again.  Does not feel dizzy when she stands from a seated position that she is aware of but she is more anemic.  Lab Results  Component Value Date   WBC 5.7 01/10/2015   HGB 10.9* 01/10/2015   HCT 33.4* 01/10/2015   MCV 88.0 01/10/2015   PLT 342.0 01/10/2015   Saw Dr. Marvia Pickles in 06/2012- pap neg, HPV neg.  Cleared for repeat pap in 2015 which I performed and was negative.  Mammogram 01/06/15- scheduled for additional viewed 01/15/15.  No family h/o breast CA.  She did have benign breast mass removed when she was a teenager.  Hyperlipidemia- improved with diet since last year. Lab Results  Component Value Date   CHOL 186 01/10/2015   HDL 50.20 01/10/2015   LDLCALC 126* 01/10/2015   LDLDIRECT 145.1 10/26/2012   TRIG 51.0 01/10/2015   CHOLHDL 4 01/10/2015    Wt Readings from Last 3 Encounters:  01/14/15 218 lb 8 oz (99.111 kg)  01/03/15 220 lb 9.6 oz (100.064 kg)  12/24/14 222 lb 2 oz (100.755 kg)    H/o Glucose intolerance-   Does have a h/o gestational diabetes with both pregnanies, diet controlled.  ADoes have strong FH of DM.  a1c stable.  Lab Results  Component Value Date   HGBA1C 6.2 01/10/2015    Patient Active Problem List   Diagnosis Date Noted  . Anemia 01/14/2015  . Well woman exam 01/02/2015  . Frequent headaches 02/18/2014  . Insomnia 02/18/2014  . Stress incontinence in female 01/10/2014  . Encounter for routine gynecological examination 01/10/2014  . Elevated blood sugar 10/31/2012  . History of gestational diabetes 10/26/2012  . HLD (hyperlipidemia) 10/23/2009  . HYPERTENSION, BENIGN SYSTEMIC 06/02/2006   Past Medical History  Diagnosis Date  . Onychia and paronychia of toe   . Anemia     acute blood loss in High school  . Gestational diabetes   . PIH (pregnancy induced hypertension)     severe, required Mgs04   . Postpartum endometritis    Past Surgical History  Procedure Laterality Date  . Cesarean section  02-04-1999     vbac 1-06 and 05-19-2005  . Cholecystectomy    . Breast surgery     Social History  Substance Use Topics  . Smoking status: Never Smoker   . Smokeless tobacco: Never Used  . Alcohol Use: No   Family History  Problem Relation Age of Onset  . Diabetes Mother   . Cancer Mother     breast and lung  . Hypertension Mother    No Known Allergies Current Outpatient Prescriptions on File Prior to Visit  Medication Sig Dispense Refill  . naproxen (NAPROSYN) 500 MG tablet Take 1 tablet (500 mg total) by mouth 2 (two) times daily with a meal. 30 tablet 0   No current facility-administered medications on file prior to visit.   The PMH, PSH, Social History, Family History, Medications, and allergies have been reviewed in [facility], and have been updated if relevant.   Review of Systems  Constitutional: Negative.   Eyes: Negative.   Respiratory: Negative.   Cardiovascular: Negative.   Gastrointestinal: Negative.   Endocrine: Negative.   Genitourinary: Negative.   Musculoskeletal: Negative.   Skin: Negative.   Allergic/Immunologic: Negative.   Neurological: Negative.   Hematological: Negative.   Psychiatric/Behavioral: Negative.   All other  systems reviewed and are negative.   Physical Exam BP 130/84 mmHg  Pulse 72  Temp(Src) 98.5 F (36.9 C) (Oral)  Ht 5\' 9"  (1.753 m)  Wt 218 lb 8 oz (99.111 kg)  BMI 32.25 kg/m2  LMP 12/17/2014 Wt Readings from Last 3 Encounters:  01/14/15 218 lb 8 oz (99.111 kg)  01/03/15 220 lb 9.6 oz (100.064 kg)  12/24/14 222 lb 2 oz (100.755 kg)   General:  Well-developed,well-nourished,in no acute distress; alert,appropriate and cooperative throughout examination Head:  normocephalic and atraumatic.   Eyes:  vision grossly intact, pupils equal, pupils round, and pupils reactive to light.   Ears:  R ear normal and L ear normal.   Nose:   no external deformity.   Mouth:  good dentition.   Neck:  No deformities, masses, or tenderness noted. Breasts:  No mass, nodules, thickening, tenderness, bulging, retraction, inflamation, nipple discharge or skin changes noted.   Lungs:  Normal respiratory effort, chest expands symmetrically. Lungs are clear to auscultation, no crackles or wheezes. Heart:  Normal rate and regular rhythm. S1 and S2 normal without gallop, murmur, click, rub or other extra sounds. Abdomen:  Bowel sounds positive,abdomen soft and non-tender without masses, organomegaly or hernias noted. Rectal:  no external abnormalities.   Genitalia:  Pelvic Exam:        External: normal female genitalia without lesions or masses        Vagina: normal without lesions or masses        Cervix: normal without lesions or masses        Adnexa: normal bimanual exam without masses or fullness        Uterus: normal by palpation        Pap smear: performed Msk:  No deformity or scoliosis noted of thoracic or lumbar spine.   Extremities:  No clubbing, cyanosis, edema, or deformity noted with normal full range of motion of all joints.   Neurologic:  alert & oriented X3 and gait normal.   Skin:  Intact without suspicious lesions or rashes Cervical Nodes:  No lymphadenopathy noted Axillary Nodes:  No palpable lymphadenopathy Psych:  Cognition and judgment appear intact. Alert and cooperative with normal attention span and concentration. No apparent delusions, illusions, hallucinations

## 2015-01-14 NOTE — Assessment & Plan Note (Signed)
Pap smear done today

## 2015-01-14 NOTE — Assessment & Plan Note (Signed)
Deteriorated likely due to heavier periods. Restart iron.

## 2015-01-14 NOTE — Assessment & Plan Note (Signed)
Reviewed preventive care protocols, scheduled due services, and updated immunizations Discussed nutrition, exercise, diet, and healthy lifestyle.  

## 2015-01-14 NOTE — Assessment & Plan Note (Signed)
Improved with diet. Encouraged her to continue working on diet and weight loss.

## 2015-01-15 ENCOUNTER — Ambulatory Visit
Admission: RE | Admit: 2015-01-15 | Discharge: 2015-01-15 | Disposition: A | Discharge status: Home / Self Care | Payer: Managed Care, Other (non HMO) | Source: Ambulatory Visit | Attending: Family Medicine | Admitting: Family Medicine

## 2015-01-15 DIAGNOSIS — R928 Other abnormal and inconclusive findings on diagnostic imaging of breast: Secondary | ICD-10-CM

## 2015-01-17 LAB — CYTOLOGY - PAP

## 2015-01-20 ENCOUNTER — Encounter: Payer: Self-pay | Admitting: *Deleted

## 2015-03-20 ENCOUNTER — Ambulatory Visit (INDEPENDENT_AMBULATORY_CARE_PROVIDER_SITE_OTHER): Payer: Managed Care, Other (non HMO) | Admitting: Family Medicine

## 2015-03-20 ENCOUNTER — Encounter: Payer: Self-pay | Admitting: Family Medicine

## 2015-03-20 VITALS — BP 134/80 | HR 79 | Temp 98.4°F | Ht 69.0 in | Wt 222.2 lb

## 2015-03-20 DIAGNOSIS — M79674 Pain in right toe(s): Secondary | ICD-10-CM

## 2015-03-20 DIAGNOSIS — M7989 Other specified soft tissue disorders: Secondary | ICD-10-CM

## 2015-03-20 DIAGNOSIS — M79671 Pain in right foot: Secondary | ICD-10-CM

## 2015-03-20 LAB — CBC WITH DIFFERENTIAL/PLATELET
BASOS ABS: 0 10*3/uL (ref 0.0–0.1)
Basophils Relative: 0.5 % (ref 0.0–3.0)
EOS ABS: 0.1 10*3/uL (ref 0.0–0.7)
Eosinophils Relative: 1.1 % (ref 0.0–5.0)
HEMATOCRIT: 32.5 % — AB (ref 36.0–46.0)
Hemoglobin: 10.6 g/dL — ABNORMAL LOW (ref 12.0–15.0)
LYMPHS PCT: 27.3 % (ref 12.0–46.0)
Lymphs Abs: 1.8 10*3/uL (ref 0.7–4.0)
MCHC: 32.7 g/dL (ref 30.0–36.0)
MCV: 86.4 fl (ref 78.0–100.0)
Monocytes Absolute: 0.4 10*3/uL (ref 0.1–1.0)
Monocytes Relative: 6.4 % (ref 3.0–12.0)
NEUTROS ABS: 4.2 10*3/uL (ref 1.4–7.7)
NEUTROS PCT: 64.7 % (ref 43.0–77.0)
PLATELETS: 334 10*3/uL (ref 150.0–400.0)
RBC: 3.77 Mil/uL — ABNORMAL LOW (ref 3.87–5.11)
RDW: 15.9 % — ABNORMAL HIGH (ref 11.5–15.5)
WBC: 6.4 10*3/uL (ref 4.0–10.5)

## 2015-03-20 LAB — URIC ACID: URIC ACID, SERUM: 3.6 mg/dL (ref 2.4–7.0)

## 2015-03-20 LAB — GLUCOSE, RANDOM: GLUCOSE: 129 mg/dL — AB (ref 70–99)

## 2015-03-20 MED ORDER — CEPHALEXIN 500 MG PO CAPS: 500.0000 mg | ORAL_CAPSULE | Freq: Three times a day (TID) | ORAL | Status: DC

## 2015-03-20 NOTE — Progress Notes (Signed)
Pre visit review using our clinic review tool, if applicable. No additional management support is needed unless otherwise documented below in the visit note. 

## 2015-03-20 NOTE — Progress Notes (Signed)
   Subjective:    Patient ID: Barbara [name], female    DOB: [birth year], 44 y.o.   MRN: [id number]  HPI  44 year old female pt of Dr. Hulen Shouts presents with new onset swelling and redness in her right pinky toe. 2 days ago noted itching in right pinky toe. Then yesterday morning felt more tingly, later in day noted redness, swollen. Throbbing pain, not severe.  Soaked in epsom salts.  No injury, no fall.  Feels well otherwise, no fever.   Social History /Family History/Past Medical History reviewed and updated if needed. No diabetes mellitus, but does have prediabetes. Lab Results  Component Value Date   HGBA1C 6.2 01/10/2015      Review of Systems  Constitutional: Negative for fever and fatigue.  HENT: Negative for ear pain.   Eyes: Negative for pain.  Respiratory: Negative for chest tightness and shortness of breath.   Cardiovascular: Negative for chest pain, palpitations and leg swelling.  Gastrointestinal: Negative for abdominal pain.  Genitourinary: Negative for dysuria.       Objective:   Physical Exam  Constitutional: Vital signs are normal. She appears well-developed and well-nourished. She is cooperative.  Non-toxic appearance. She does not appear ill. No distress.  HENT:  Head: Normocephalic.  Right Ear: Hearing, tympanic membrane, external ear and ear canal normal. Tympanic membrane is not erythematous, not retracted and not bulging.  Left Ear: Hearing, tympanic membrane, external ear and ear canal normal. Tympanic membrane is not erythematous, not retracted and not bulging.  Nose: No mucosal edema or rhinorrhea. Right sinus exhibits no maxillary sinus tenderness and no frontal sinus tenderness. Left sinus exhibits no maxillary sinus tenderness and no frontal sinus tenderness.  Mouth/Throat: Uvula is midline, oropharynx is clear and moist and mucous membranes are normal.  Eyes: Conjunctivae, EOM and lids are normal. Pupils are equal, round, and reactive to  light. Lids are everted and swept, no foreign bodies found.  Neck: Trachea normal and normal range of motion. Neck supple. Carotid bruit is not present. No thyroid mass and no thyromegaly present.  Cardiovascular: Normal rate, regular rhythm, S1 normal, S2 normal, intact distal pulses and normal pulses.  Exam reveals no gallop and no friction rub.   Murmur heard.  Systolic murmur is present with a grade of 2/6  Pulmonary/Chest: Effort normal and breath sounds normal. No tachypnea. No respiratory distress. She has no decreased breath sounds. She has no wheezes. She has no rhonchi. She has no rales.  Abdominal: Soft. Normal appearance and bowel sounds are normal. There is no tenderness.  Neurological: She is alert.  Skin: Skin is warm, dry and intact. No rash noted.  Right fifth digit with medial redness, swelling, no specific joint pain, not hypersensitive to touch.. Feels almost numb  Psychiatric: Her speech is normal and behavior is normal. Judgment and thought content normal. Her mood appears not anxious. Cognition and memory are normal. She does not exhibit a depressed mood.          Assessment & Plan:

## 2015-03-20 NOTE — Patient Instructions (Addendum)
Stop at lab on way pout.  Complete a course of antibiotics. Ibuprofen OTC 800 mg every 8 hours as needed for pain and swelling.  Elevate foot. Call if not improving as expected, call earlier if redness is spreading up foot, or fever.

## 2015-03-20 NOTE — Assessment & Plan Note (Signed)
Most likely cellulitis given apperance. Will check cbg for sugar control eval.   Less likely joint issu or gout ( not very painful). Will eval with uric acid.

## 2015-06-14 ENCOUNTER — Ambulatory Visit (INDEPENDENT_AMBULATORY_CARE_PROVIDER_SITE_OTHER): Payer: Managed Care, Other (non HMO) | Admitting: Family Medicine

## 2015-06-14 VITALS — BP 126/76 | HR 100 | Temp 101.8°F | Resp 16 | Ht 69.0 in | Wt 221.0 lb

## 2015-06-14 DIAGNOSIS — L03119 Cellulitis of unspecified part of limb: Secondary | ICD-10-CM | POA: Diagnosis not present

## 2015-06-14 DIAGNOSIS — R509 Fever, unspecified: Secondary | ICD-10-CM | POA: Diagnosis not present

## 2015-06-14 DIAGNOSIS — L02419 Cutaneous abscess of limb, unspecified: Secondary | ICD-10-CM

## 2015-06-14 LAB — POCT CBC
Granulocyte percent: 84.1 %G — AB (ref 37–80)
HCT, POC: 33.3 % — AB (ref 37.7–47.9)
HEMOGLOBIN: 11.3 g/dL — AB (ref 12.2–16.2)
Lymph, poc: 1.3 (ref 0.6–3.4)
MCH: 28.6 pg (ref 27–31.2)
MCHC: 34 g/dL (ref 31.8–35.4)
MCV: 84.3 fL (ref 80–97)
MID (cbc): 0.7 (ref 0–0.9)
MPV: 6.2 fL (ref 0–99.8)
POC Granulocyte: 11 — AB (ref 2–6.9)
POC LYMPH PERCENT: 10.3 %L (ref 10–50)
POC MID %: 5.6 %M (ref 0–12)
Platelet Count, POC: 330 10*3/uL (ref 142–424)
RBC: 3.95 M/uL — AB (ref 4.04–5.48)
RDW, POC: 14.6 %
WBC: 13.1 10*3/uL — AB (ref 4.6–10.2)

## 2015-06-14 LAB — POCT URINALYSIS DIP (MANUAL ENTRY)
Bilirubin, UA: NEGATIVE
Glucose, UA: NEGATIVE
Ketones, POC UA: NEGATIVE
Leukocytes, UA: NEGATIVE
Nitrite, UA: NEGATIVE
PH UA: 8.5
PROTEIN UA: NEGATIVE
RBC UA: NEGATIVE
SPEC GRAV UA: 1.015
UROBILINOGEN UA: 0.2

## 2015-06-14 LAB — POCT SEDIMENTATION RATE: POCT SED RATE: 66 mm/h — AB (ref 0–22)

## 2015-06-14 MED ORDER — CEFTRIAXONE SODIUM 1 G IJ SOLR
1.0000 g | Freq: Once | INTRAMUSCULAR | Status: AC
  Administered 2015-06-14: 1 g via INTRAMUSCULAR

## 2015-06-14 MED ORDER — DOXYCYCLINE HYCLATE 100 MG PO CAPS: 100.0000 mg | ORAL_CAPSULE | Freq: Two times a day (BID) | ORAL | Status: DC

## 2015-06-14 MED ORDER — HYDROCODONE-ACETAMINOPHEN 7.5-325 MG PO TABS: 0.5000 | ORAL_TABLET | Freq: Four times a day (QID) | ORAL | Status: DC | PRN

## 2015-06-14 MED ORDER — DOXYCYCLINE HYCLATE 100 MG PO TABS: 100.0000 mg | ORAL_TABLET | Freq: Once | ORAL | Status: DC

## 2015-06-14 NOTE — Progress Notes (Signed)
Subjective:  By signing my name below, I, Barbara [name], attest that this documentation has been prepared under the direction and in the presence of Barbara Cheadle, MD.  Electronically Signed: Thea [name], ED Scribe. 06/14/2015. 11:20 AM.   Patient ID: Barbara [name], female    DOB: 02/21/1971, 45 y.o.   MRN: [id number]  HPI Chief Complaint  Patient presents with  . absess on right hip    History of MRSA  . Fever    HPI Comments: Barbara [name] is a 45 y.o. 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 Pt states this is a recurrent abscess and that she usually is able to deal with them herself. This is the 3rd or 4th time that abscess has reoccurred in this area and has had one lanced in the past. She reports associated fever that began yesterday evening, chills and achiness. She has been doing warm compresses to abscess and was able retrieved a little drainage this morning. She took 5 ibuprofen last night for fever and 4 today while in office.  She had a banana and protein shake this morning. She denies nausea, emesis, dysuria and trouble with bowels.   She was seen 03/2015 for toe infection.   Patient Active Problem List   Diagnosis Date Noted  . Pain and swelling of toe of right foot 03/20/2015  . Anemia 01/14/2015  . Well woman exam 01/02/2015  . Frequent headaches 02/18/2014  . Insomnia 02/18/2014  . Stress incontinence in female 01/10/2014  . Encounter for routine gynecological examination 01/10/2014  . Elevated blood sugar 10/31/2012  . History of gestational diabetes 10/26/2012  . HLD (hyperlipidemia) 10/23/2009  . HYPERTENSION, BENIGN SYSTEMIC 06/02/2006   Past Medical History  Diagnosis Date  . Onychia and paronychia of toe   . Anemia     acute blood loss in High school  . Gestational diabetes   . PIH (pregnancy induced hypertension)     severe, required Mgs04  .  Postpartum endometritis    Past Surgical History  Procedure Laterality Date  . Cesarean section  02-04-1999     vbac 1-06 and 05-19-2005  . Cholecystectomy    . Breast surgery     No Known Allergies Prior to Admission medications   Not on File   Social History   Social History  . Marital Status: Married    Spouse Name: N/A  . Number of Children: 3  . Years of Education: N/A   Occupational History  . math teacher    Social History Main Topics  . Smoking status: Never Smoker   . Smokeless tobacco: Never Used  . Alcohol Use: No  . Drug Use: No  . Sexual Activity:    Partners: Male    Birth Control/ Protection: IUD     Comment: MIRENA PLACED 05/2008 (PCP)   Other Topics Concern  . Not on file   Social History Narrative   Married to [place], had son Barbara [name] 11-00, Barbara [name], and Barbara [name]   Review of Systems  Constitutional: Positive for fever, chills, diaphoresis, activity change and fatigue. Negative for appetite change.  Gastrointestinal: Negative for nausea, vomiting, diarrhea and constipation.  Genitourinary: Negative for dysuria and difficulty urinating.  Musculoskeletal: Positive for myalgias, back pain, joint swelling, arthralgias and gait problem.  Skin: Positive for color change and wound.  Allergic/Immunologic: Negative for immunocompromised state.  Neurological: Negative  for weakness and numbness.  Hematological: Negative for adenopathy.  Psychiatric/Behavioral: Positive for sleep disturbance.   Objective:   Physical Exam  Constitutional: She is oriented to person, place, and time. She appears well-developed and well-nourished. No distress.  HENT:  Head: Normocephalic and atraumatic.  Eyes: Conjunctivae and EOM are normal.  Neck: Neck supple.  Cardiovascular: Tachycardia present.   Pulmonary/Chest: Effort normal.  Musculoskeletal: Normal range of motion.  Neurological: She is alert and oriented to person, place, and time.  Skin: Skin is warm and  dry.  Psychiatric: She has a normal mood and affect. Her behavior is normal.  Nursing note and vitals reviewed.  Filed Vitals:   06/14/15 1033  BP: 126/76  Pulse: 100  Temp: 101.8 F (38.8 C)  TempSrc: Oral  Resp: 16  Height: 5\' 9"  (1.753 m)  Weight: 221 lb (100.245 kg)  SpO2: 96%   Results for orders placed or performed in visit on 06/14/15  POCT CBC  Result Value Ref Range   WBC 13.1 (A) 4.6 - 10.2 K/uL   Lymph, poc 1.3 0.6 - 3.4   POC LYMPH PERCENT 10.3 10 - 50 %L   MID (cbc) 0.7 0 - 0.9   POC MID % 5.6 0 - 12 %M   POC Granulocyte 11.0 (A) 2 - 6.9   Granulocyte percent 84.1 (A) 37 - 80 %G   RBC 3.95 (A) 4.04 - 5.48 M/uL   Hemoglobin 11.3 (A) 12.2 - 16.2 g/dL   HCT, POC 33.3 (A) 37.7 - 47.9 %   MCV 84.3 80 - 97 fL   MCH, POC 28.6 27 - 31.2 pg   MCHC 34.0 31.8 - 35.4 g/dL   RDW, POC 14.6 %   Platelet Count, POC 330 142 - 424 K/uL   MPV 6.2 0 - 99.8 fL  POCT SEDIMENTATION RATE  Result Value Ref Range   POCT SED RATE 66 (A) 0 - 22 mm/hr  POCT urinalysis dipstick  Result Value Ref Range   Color, UA yellow yellow   Clarity, UA hazy (A) clear   Glucose, UA negative negative   Bilirubin, UA negative negative   Ketones, POC UA negative negative   Spec Grav, UA 1.015    Blood, UA negative negative   pH, UA 8.5    Protein Ur, POC negative negative   Urobilinogen, UA 0.2    Nitrite, UA Negative Negative   Leukocytes, UA Negative Negative       Assessment & Plan:   1. Fever, unspecified   2. Cellulitis and abscess of leg   Pt has a MRSA abscess on her right hip which has undergone I&D today by Beaulah Corin. However, I am concerned that she is having systemic sxs from the infxn and is actually running the risk of sepsis - fortunately as she is a health young woman without sig co-morbidities I expect that she is going to do well but will cover with 1g of Rocephin in office now and start doxy with recheck tomorrow to ensure fevers, chills, and the warmth from the wound  that is extending across whole of buttocks and lower abdomen is improving.  Orders Placed This Encounter  Procedures  . Wound culture    Order Specific Question:  Source    Answer:  right hip  . POCT CBC  . POCT SEDIMENTATION RATE  . POCT urinalysis dipstick    Meds ordered this encounter  Medications  . DISCONTD: doxycycline (VIBRA-TABS) tablet 100 mg    Sig:   .  cefTRIAXone (ROCEPHIN) injection 1 g    Sig:     Order Specific Question:  Antibiotic Indication:    Answer:  Cellulitis  . HYDROcodone-acetaminophen (NORCO) 7.5-325 MG tablet    Sig: Take 0.5-1 tablets by mouth every 6 (six) hours as needed for severe pain.    Dispense:  12 tablet    Refill:  0    Order Specific Question:  Supervising Provider    Answer:  Carlota Raspberry, JEFFREY R [[id number]]  . doxycycline (VIBRAMYCIN) 100 MG capsule    Sig: Take 1 capsule (100 mg total) by mouth 2 (two) times daily.    Dispense:  20 capsule    Refill:  0    I personally performed the services described in this documentation, which was scribed in my presence. The recorded information has been reviewed and considered, and addended by me as needed.  Barbara Cheadle, MD MPH

## 2015-06-14 NOTE — Progress Notes (Signed)
Risk and benefits discussed and verbal consent obtained. Anesthetic allergies reviewed. Patient anesthetized using 1:1 mix of 2% lidocaine with epi. A 1 cm incision was made using a number 11 blade and purulent material was expressed.  The was wound packed. The patient tolerated the procedure without difficulty.   A clean dressing was placed and wound care instructions were provided.  Philis Fendt, MS, PA-C 11:12 AM, 06/14/2015

## 2015-06-14 NOTE — Patient Instructions (Addendum)
WOUND CARE Please return in 1 days to have your stitches/staples removed or sooner if you have concerns. Marland Kitchen Keep area clean and dry for 24 hours. Do not remove bandage, if applied. . After 24 hours, remove bandage and wash wound gently with mild soap and warm water. Reapply a new bandage after cleaning wound, if directed. . Continue daily cleansing with soap and water until stitches/staples are removed. . Do not apply any ointments or creams to the wound while stitches/staples are in place, as this may cause delayed healing. . Notify the office if you experience any of the following signs of infection: Swelling, redness, pus drainage, streaking, fever >101.0 F . Notify the office if you experience excessive bleeding that does not stop after 15-20 minutes of constant, firm pressure.    IF you received an x-ray today, you will receive an invoice from [facility]. Please contact [facility] at [phone] with questions or concerns regarding your invoice.   IF you received labwork today, you will receive an invoice from [employer]. Please contact [facility] at [phone] with questions or concerns regarding your invoice.   Our billing staff will not be able to assist you with questions regarding bills from these companies.  You will be contacted with the lab results as soon as they are available. The fastest way to get your results is to activate your My Chart account. Instructions are located on the last page of this paperwork. If you have not heard from [place] regarding the results in 2 weeks, please contact this office.   Sepsis, Adult Sepsis is a serious infection of your blood or tissues that affects your whole body. The infection that causes sepsis may be bacterial, viral, fungal, or parasitic. Sepsis may be life threatening. Sepsis can cause your blood pressure to drop. This may result in shock. Shock causes your central nervous system and  your organs to stop working correctly.  RISK FACTORS Sepsis can happen in anyone, but it is more likely to happen in people who have weakened immune systems. SIGNS AND SYMPTOMS  Symptoms of sepsis can include:  Fever or low body temperature (hypothermia).  Rapid breathing (hyperventilation).  Chills.  Rapid heartbeat (tachycardia).  Confusion or light-headedness.  Trouble breathing.  Urinating much less than usual.  Cool, clammy skin or red, flushed skin.  Other problems with the heart, kidneys, or brain. DIAGNOSIS  Your health care provider will likely do tests to look for an infection, to see if the infection has spread to your blood, and to see how serious your condition is. Tests can include:  Blood tests, including cultures of your blood.  Cultures of other fluids from your body, such as:  Urine.  Pus from wounds.  Mucus coughed up from your lungs.  Urine tests other than cultures.  X-ray exams or other imaging tests. TREATMENT  Treatment will begin with elimination of the source of infection. If your sepsis is likely caused by a bacterial or fungal infection, you will be given antibiotic or antifungal medicines. You may also receive:  Oxygen.  Fluids through an IV tube.  Medicines to increase your blood pressure.  A machine to clean your blood (dialysis) if your kidneys fail.  A machine to help you breathe if your lungs fail. SEEK IMMEDIATE MEDICAL CARE IF: You get an infection or develop any of the signs and symptoms of sepsis after surgery or a hospitalization.   This information is not intended to replace advice given to you  by your health care provider. Make sure you discuss any questions you have with your health care provider.   Document Released: 12/19/2002 Document Revised: 08/06/2014 Document Reviewed: 11/27/2012 Elsevier Interactive Patient Education 2016 [employer] Abscess An abscess is an infected area that contains a collection of pus  and debris.It can occur in almost any part of the body. An abscess is also known as a furuncle or boil. CAUSES  An abscess occurs when tissue gets infected. This can occur from blockage of oil or sweat glands, infection of hair follicles, or a minor injury to the skin. As the body tries to fight the infection, pus collects in the area and creates pressure under the skin. This pressure causes pain. People with weakened immune systems have difficulty fighting infections and get certain abscesses more often.  SYMPTOMS Usually an abscess develops on the skin and becomes a painful mass that is red, warm, and tender. If the abscess forms under the skin, you may feel a moveable soft area under the skin. Some abscesses break open (rupture) on their own, but most will continue to get worse without care. The infection can spread deeper into the body and eventually into the bloodstream, causing you to feel ill.  DIAGNOSIS  Your caregiver will take your medical history and perform a physical exam. A sample of fluid may also be taken from the abscess to determine what is causing your infection. TREATMENT  Your caregiver may prescribe antibiotic medicines to fight the infection. However, taking antibiotics alone usually does not cure an abscess. Your caregiver may need to make a small cut (incision) in the abscess to drain the pus. In some cases, gauze is packed into the abscess to reduce pain and to continue draining the area. HOME CARE INSTRUCTIONS   Only take over-the-counter or prescription medicines for pain, discomfort, or fever as directed by your caregiver.  If you were prescribed antibiotics, take them as directed. Finish them even if you start to feel better.  If gauze is used, follow your caregiver's directions for changing the gauze.  To avoid spreading the infection:  Keep your draining abscess covered with a bandage.  Wash your hands well.  Do not share personal care items, towels, or  whirlpools with others.  Avoid skin contact with others.  Keep your skin and clothes clean around the abscess.  Keep all follow-up appointments as directed by your caregiver. SEEK MEDICAL CARE IF:   You have increased pain, swelling, redness, fluid drainage, or bleeding.  You have muscle aches, chills, or a general ill feeling.  You have a fever. MAKE SURE YOU:   Understand these instructions.  Will watch your condition.  Will get help right away if you are not doing well or get worse.   This information is not intended to replace advice given to you by your health care provider. Make sure you discuss any questions you have with your health care provider.   Document Released: 12/30/2004 Document Revised: 09/21/2011 Document Reviewed: 06/04/2011 Elsevier Interactive Patient Education [employer].

## 2015-06-15 ENCOUNTER — Ambulatory Visit (INDEPENDENT_AMBULATORY_CARE_PROVIDER_SITE_OTHER): Payer: Managed Care, Other (non HMO) | Admitting: Family Medicine

## 2015-06-15 VITALS — BP 128/76 | HR 110 | Temp 100.0°F | Resp 12 | Ht 69.0 in | Wt 223.0 lb

## 2015-06-15 DIAGNOSIS — R509 Fever, unspecified: Secondary | ICD-10-CM

## 2015-06-15 DIAGNOSIS — L02219 Cutaneous abscess of trunk, unspecified: Secondary | ICD-10-CM

## 2015-06-15 DIAGNOSIS — D509 Iron deficiency anemia, unspecified: Secondary | ICD-10-CM

## 2015-06-15 DIAGNOSIS — L03319 Cellulitis of trunk, unspecified: Secondary | ICD-10-CM

## 2015-06-15 LAB — POCT CBC
GRANULOCYTE PERCENT: 74 % (ref 37–80)
HCT, POC: 28.2 % — AB (ref 37.7–47.9)
HEMOGLOBIN: 9.9 g/dL — AB (ref 12.2–16.2)
Lymph, poc: 1.4 (ref 0.6–3.4)
MCH, POC: 29.6 pg (ref 27–31.2)
MCHC: 35.2 g/dL (ref 31.8–35.4)
MCV: 83.9 fL (ref 80–97)
MID (cbc): 0.5 (ref 0–0.9)
MPV: 5.7 fL (ref 0–99.8)
PLATELET COUNT, POC: 299 10*3/uL (ref 142–424)
POC Granulocyte: 5.4 (ref 2–6.9)
POC LYMPH %: 19.7 % (ref 10–50)
POC MID %: 6.3 %M (ref 0–12)
RBC: 3.36 M/uL — AB (ref 4.04–5.48)
RDW, POC: 14.2 %
WBC: 7.3 10*3/uL (ref 4.6–10.2)

## 2015-06-15 NOTE — Patient Instructions (Addendum)
Please replace the dressing if it becomes saturated, leaks or falls off, of it it gets wet with bathing.  Please replace the dressing at least once every 24 hours.  Do not remove the ribbon of gauze packing.    It is ok to bath just make sure the wound is dry afterwards and you replace the dressing.  Start or Continue warm compresses - dry heat is the best as it will not get your dressing wet.  Recheck as directed or if you have any concerns.   IF you received an x-ray today, you will receive an invoice from [facility]. Please contact [facility] at [phone] with questions or concerns regarding your invoice.   IF you received labwork today, you will receive an invoice from [employer]. Please contact [facility] at [phone] with questions or concerns regarding your invoice.   Our billing staff will not be able to assist you with questions regarding bills from these companies.  You will be contacted with the lab results as soon as they are available. The fastest way to get your results is to activate your My Chart account. Instructions are located on the last page of this paperwork. If you have not heard from [place] regarding the results in 2 weeks, please contact this office.   Start an iron supplement one a day - I recommend ferrous sulfate 359m (698mof elemental iron).  Take this on an empty stomach 30 minutes before eating or 2 hours after a meal.  Take it with a vitamin C supplement or a small glass of orange juice.   Iron Deficiency Anemia, Adult Anemia is a condition in which there are less red blood cells or hemoglobin in the blood than normal. Hemoglobin is the part of red blood cells that carries oxygen. Iron deficiency anemia is anemia caused by too little iron. It is the most common type of anemia. It may leave you tired and short of breath. CAUSES   Lack of iron in the diet.  Poor absorption of iron, as seen with intestinal  disorders.  Intestinal bleeding.  Heavy periods. SIGNS AND SYMPTOMS  Mild anemia may not be noticeable. Symptoms may include:  Fatigue.  Headache.  Pale skin.  Weakness.  Tiredness.  Shortness of breath.  Dizziness.  Cold hands and feet.  Fast or irregular heartbeat. DIAGNOSIS  Diagnosis requires a thorough evaluation and physical exam by your health care provider. Blood tests are generally used to confirm iron deficiency anemia. Additional tests may be done to find the underlying cause of your anemia. These may include:  Testing for blood in the stool (fecal occult blood test).  A procedure to see inside the colon and rectum (colonoscopy).  A procedure to see inside the esophagus and stomach (endoscopy). TREATMENT  Iron deficiency anemia is treated by correcting the cause of the deficiency. Treatment may involve:  Adding iron-rich foods to your diet.  Taking iron supplements. Pregnant or breastfeeding women need to take extra iron because their normal diet usually does not provide the required amount.  Taking vitamins. Vitamin C improves the absorption of iron. Your health care provider may recommend that you take your iron tablets with a glass of orange juice or vitamin C supplement.  Medicines to make heavy menstrual flow lighter.  Surgery. HOME CARE INSTRUCTIONS   Take iron as directed by your health care provider.  If you cannot tolerate taking iron supplements by mouth, talk to your health care provider about taking them  through a vein (intravenously) or an injection into a muscle.  For the best iron absorption, iron supplements should be taken on an empty stomach. If you cannot tolerate them on an empty stomach, you may need to take them with food.  Do not drink milk or take antacids at the same time as your iron supplements. Milk and antacids may interfere with the absorption of iron.  Iron supplements can cause constipation. Make sure to include fiber  in your diet to prevent constipation. A stool softener may also be recommended.  Take vitamins as directed by your health care provider.  Eat a diet rich in iron. Foods high in iron include liver, lean beef, whole-grain bread, eggs, dried fruit, and dark green leafy vegetables. SEEK IMMEDIATE MEDICAL CARE IF:   You faint. If this happens, do not drive. Call your local emergency services (911 in [place] if no other help is available.  You have chest pain.  You feel nauseous or vomit.  You have severe or increased shortness of breath with activity.  You feel weak.  You have a rapid heartbeat.  You have unexplained sweating.  You become light-headed when getting up from a chair or bed. MAKE SURE YOU:   Understand these instructions.  Will watch your condition.  Will get help right away if you are not doing well or get worse.   This information is not intended to replace advice given to you by your health care provider. Make sure you discuss any questions you have with your health care provider.   Document Released: 03/19/2000 Document Revised: 04/12/2014 Document Reviewed: 08/25 Iron-Rich Diet Iron is a mineral that helps your body to produce hemoglobin. Hemoglobin is a protein in your red blood cells that carries oxygen to your body's tissues. Eating too little iron may cause you to feel weak and tired, and it can increase your risk for infection. Eating enough iron is necessary for your body's metabolism, muscle function, and nervous system. Iron is naturally found in many foods. It can also be added to foods or fortified in foods. There are two types of dietary iron:  Heme iron. Heme iron is absorbed by the body more easily than nonheme iron. Heme iron is found in meat, poultry, and fish.  Nonheme iron. Nonheme iron is found in dietary supplements, iron-fortified grains, beans, and vegetables. You may need to follow an iron-rich diet if:  You have been diagnosed with iron  deficiency or iron-deficiency anemia.  You have a condition that prevents you from absorbing dietary iron, such as:  Infection in your intestines.  Celiac disease. This involves long-lasting (chronic) inflammation of your intestines.  You do not eat enough iron.  You eat a diet that is high in foods that impair iron absorption.  You have lost a lot of blood.  You have heavy bleeding during your menstrual cycle.  You are pregnant. WHAT IS MY PLAN? Your health care provider may help you to determine how much iron you need per day based on your condition. Generally, when a person consumes sufficient amounts of iron in the diet, the following iron needs are met:  Men.  94-62 years old: 11 mg per day.  60-56 years old: 8 mg per day.  Women.   43-38 years old: 15 mg per day.  83-12 years old: 18 mg per day.  Over 74 years old: 8 mg per day.  Pregnant women: 27 mg per day.  Breastfeeding women: 9 mg per day. WHAT DO  I NEED TO KNOW ABOUT AN IRON-RICH DIET?  Eat fresh fruits and vegetables that are high in vitamin C along with foods that are high in iron. This will help increase the amount of iron that your body absorbs from food, especially with foods containing nonheme iron. Foods that are high in vitamin C include oranges, peppers, tomatoes, and mango.  Take iron supplements only as directed by your health care provider. Overdose of iron can be life-threatening. If you were prescribed iron supplements, take them with orange juice or a vitamin C supplement.  Cook foods in pots and pans that are made from iron.   Eat nonheme iron-containing foods alongside foods that are high in heme iron. This helps to improve your iron absorption.   Certain foods and drinks contain compounds that impair iron absorption. Avoid eating these foods in the same meal as iron-rich foods or with iron supplements. These include:  Coffee, black tea, and red wine.  Milk, dairy products, and foods  that are high in calcium.  Beans, soybeans, and peas.  Whole grains.  When eating foods that contain both nonheme iron and compounds that impair iron absorption, follow these tips to absorb iron better.   Soak beans overnight before cooking.  Soak whole grains overnight and drain them before using.  Ferment flours before baking, such as using yeast in bread dough. WHAT FOODS CAN I EAT? Grains Iron-fortified breakfast cereal. Iron-fortified whole-wheat bread. Enriched rice. Sprouted grains. Vegetables Spinach. Potatoes with skin. Green peas. Broccoli. Red and green bell peppers. Fermented vegetables. Fruits Prunes. Raisins. Oranges. Strawberries. Mango. Grapefruit. Meats and Other Protein Sources Beef liver. Oysters. Beef. Shrimp. [place]. Chicken. [place]. Sardines. Chickpeas. Nuts. Tofu. Beverages Tomato juice. Fresh orange juice. Prune juice. Hibiscus tea. Fortified instant breakfast shakes. Condiments Tahini. Fermented soy sauce. Sweets and Desserts Black-strap molasses.  Other Wheat germ. The items listed above may not be a complete list of recommended foods or beverages. Contact your dietitian for more options. WHAT FOODS ARE NOT RECOMMENDED? Grains Whole grains. Bran cereal. Bran flour. Oats. Vegetables Artichokes. Brussels sprouts. Kale. Fruits Blueberries. Raspberries. Strawberries. Figs. Meats and Other Protein Sources Soybeans. Products made from soy protein. Dairy Milk. Cream. Cheese. Yogurt. Cottage cheese. Beverages Coffee. Black tea. Red wine. Sweets and Desserts Cocoa. Chocolate. Ice cream. Other Basil. Oregano. Parsley. The items listed above may not be a complete list of foods and beverages to avoid. Contact your dietitian for more information.   This information is not intended to replace advice given to you by your health care provider. Make sure you discuss any questions you have with your health care provider.   Document  Released: 11/03/2004 Document Revised: 04/12/2014 Document Reviewed: 10/17/2013 Elsevier Interactive Patient Education 2016 [employer]. /2014 Elsevier Interactive Patient Education 2016 [employer].

## 2015-06-15 NOTE — Progress Notes (Signed)
Procedure:  Consent obtained - drsg and packing removed - irrigated with 2% lido with epi - some necrotic tissue is removed - repacked with 1/4 in packing - drsg placed and wound care d/w pt.  Recheck in 87h

## 2015-06-15 NOTE — Progress Notes (Signed)
Subjective:    Patient ID: Barbara [name], female    DOB: 1970-08-03, 45 y.o.   MRN: [id number] By signing my name below, I, Barbara [name], attest that this documentation has been prepared under the direction and in the presence of Delman Cheadle, MD. Electronically Signed: Judithe [name], ER Scribe. 06/15/2015. 12:37 PM.  Chief Complaint  Patient presents with  . Wound Check    left hip     HPI HPI Comments: Barbara [name] is a 45 y.o. female who presents to [facility] for a follow up after being seen yesterday for an abscess in the right inguinal area. She has had significant improvement over night. She is not having any further symptomatic fever or diaphoresis. She continues to have some pain along the lateral aspect of her inguinal area. She feels some tenderness and a mass. No myalgias or arthralgias. She is tolerating doxycycline well. Has not taken any pyretics beyond Vicodin.    Past Medical History  Diagnosis Date  . Onychia and paronychia of toe   . Anemia     acute blood loss in High school  . Gestational diabetes   . PIH (pregnancy induced hypertension)     severe, required Mgs04  . Postpartum endometritis    No Known Allergies  Current Outpatient Prescriptions on File Prior to Visit  Medication Sig Dispense Refill  . doxycycline (VIBRAMYCIN) 100 MG capsule Take 1 capsule (100 mg total) by mouth 2 (two) times daily. 20 capsule 0  . HYDROcodone-acetaminophen (NORCO) 7.5-325 MG tablet Take 0.5-1 tablets by mouth every 6 (six) hours as needed for severe pain. 12 tablet 0   No current facility-administered medications on file prior to visit.    Review of Systems  Constitutional: Positive for activity change and appetite change. Negative for fever, chills, diaphoresis and unexpected weight change.  Cardiovascular: Negative for leg swelling.  Gastrointestinal: Negative for nausea, vomiting and abdominal pain.  Musculoskeletal: Positive for myalgias. Negative for  back pain, arthralgias and gait problem.  Skin: Positive for color change and wound.  Neurological: Negative for weakness and numbness.  Hematological: Positive for adenopathy.  Psychiatric/Behavioral: Positive for sleep disturbance.       Objective:  BP 128/76 mmHg  Pulse 110  Temp(Src) 100 F (37.8 C) (Oral)  Resp 12  Ht 5\' 9"  (1.753 m)  Wt 223 lb (101.152 kg)  BMI 32.92 kg/m2  SpO2 97%  LMP 06/15/2015  Physical Exam  Constitutional: She is oriented to person, place, and time. She appears well-developed and well-nourished. No distress.  HENT:  Head: Normocephalic and atraumatic.  Eyes: Pupils are equal, round, and reactive to light.  Neck: Neck supple.  Cardiovascular: Normal rate.   Pulmonary/Chest: Effort normal. No respiratory distress.  Abdominal:  Mild tenderness over inguinal area with warmth and erythema significantly decreased. Appropriately wound has had purulent sanguinis drainage overnight.   Musculoskeletal: Normal range of motion.  Neurological: She is alert and oriented to person, place, and time. Coordination normal.  Skin: Skin is warm and dry. She is not diaphoretic.  Psychiatric: She has a normal mood and affect. Her behavior is normal.  Nursing note and vitals reviewed.     Results for orders placed or performed in visit on 06/15/15  POCT CBC  Result Value Ref Range   WBC 7.3 4.6 - 10.2 K/uL   Lymph, poc 1.4 0.6 - 3.4   POC LYMPH PERCENT 19.7 10 - 50 %L   MID (cbc) 0.5 0 - 0.9  POC MID % 6.3 0 - 12 %M   POC Granulocyte 5.4 2 - 6.9   Granulocyte percent 74.0 37 - 80 %G   RBC 3.36 (A) 4.04 - 5.48 M/uL   Hemoglobin 9.9 (A) 12.2 - 16.2 g/dL   HCT, POC 28.2 (A) 37.7 - 47.9 %   MCV 83.9 80 - 97 fL   MCH, POC 29.6 27 - 31.2 pg   MCHC 35.2 31.8 - 35.4 g/dL   RDW, POC 14.2 %   Platelet Count, POC 299 142 - 424 K/uL   MPV 5.7 0 - 99.8 fL    Assessment & Plan:   1. Fever, unspecified - systemic infectious symptoms significantly improved over the  past 24 hours so cont doxy. Recheck in 48 hrs, cont wet warm compresses.  2. Cellulitis and abscess of trunk   3. Anemia, iron deficiency - pt is aware and already on iron supp, following with PCP    Orders Placed This Encounter  Procedures  . Apply dressing  . Care order/instruction    AVS printed - let patient go!  Marland Kitchen POCT CBC     I personally performed the services described in this documentation, which was scribed in my presence. The recorded information has been reviewed and considered, and addended by me as needed.  Delman Cheadle, MD MPH

## 2015-06-16 LAB — WOUND CULTURE: Gram Stain: NONE SEEN

## 2015-06-17 ENCOUNTER — Ambulatory Visit (INDEPENDENT_AMBULATORY_CARE_PROVIDER_SITE_OTHER): Payer: Managed Care, Other (non HMO) | Admitting: Physician Assistant

## 2015-06-17 VITALS — BP 118/78 | HR 85 | Temp 98.7°F | Resp 16 | Ht 69.0 in | Wt 222.6 lb

## 2015-06-17 DIAGNOSIS — L03319 Cellulitis of trunk, unspecified: Secondary | ICD-10-CM

## 2015-06-17 DIAGNOSIS — L02219 Cutaneous abscess of trunk, unspecified: Secondary | ICD-10-CM

## 2015-06-17 DIAGNOSIS — Z22322 Carrier or suspected carrier of Methicillin resistant Staphylococcus aureus: Secondary | ICD-10-CM

## 2015-06-17 MED ORDER — MUPIROCIN 2 % EX OINT: 1.0000 "application " | TOPICAL_OINTMENT | Freq: Two times a day (BID) | CUTANEOUS | Status: DC

## 2015-06-17 NOTE — Progress Notes (Signed)
Procedure Note:  - The patient presented for a follow up wound check. Wound dressing and packing was removed. Surrounding the incision site there was 0.5cm circumference of dense, necrotic tissue. The site was sterilized with iodine swabs and the tissue was removed using an 11 blade scalpel and curettage until healthy tissue was revealed. The wound was then packed with 1/2 inch packing and a non-adherent bandage and hypafix was placed on top to seal the area.

## 2015-06-17 NOTE — Progress Notes (Signed)
   Barbara [name]  MRN: [id number] DOB: July 22, 1970  Subjective:  Pt presents to clinic for wound recheck.  She feels like it is doing better.  Still tolerating medication ok.  She has had h/o MRSA in the past - it was years ago but she has had multiple small boils that she has been able to open and then they have resolved on their own.    Patient Active Problem List   Diagnosis Date Noted  . Pain and swelling of toe of right foot 03/20/2015  . Anemia 01/14/2015  . Well woman exam 01/02/2015  . Frequent headaches 02/18/2014  . Insomnia 02/18/2014  . Stress incontinence in female 01/10/2014  . Encounter for routine gynecological examination 01/10/2014  . Elevated blood sugar 10/31/2012  . History of gestational diabetes 10/26/2012  . HLD (hyperlipidemia) 10/23/2009  . HYPERTENSION, BENIGN SYSTEMIC 06/02/2006    Current Outpatient Prescriptions on File Prior to Visit  Medication Sig Dispense Refill  . doxycycline (VIBRAMYCIN) 100 MG capsule Take 1 capsule (100 mg total) by mouth 2 (two) times daily. 20 capsule 0  . HYDROcodone-acetaminophen (NORCO) 7.5-325 MG tablet Take 0.5-1 tablets by mouth every 6 (six) hours as needed for severe pain. 12 tablet 0   No current facility-administered medications on file prior to visit.    No Known Allergies  Review of Systems  Constitutional: Negative for fever and chills.  Skin: Positive for wound.   Objective:  BP 118/78 mmHg  Pulse 85  Temp(Src) 98.7 F (37.1 C) (Oral)  Resp 16  Ht 5\' 9"  (1.753 m)  Wt 222 lb 9.6 oz (100.971 kg)  BMI 32.86 kg/m2  SpO2 97%  LMP 06/15/2015  Physical Exam  Constitutional: She is oriented to person, place, and time and well-developed, well-nourished, and in no distress.  HENT:  Head: Normocephalic and atraumatic.  Right Ear: Hearing and external ear normal.  Left Ear: Hearing and external ear normal.  Eyes: Conjunctivae are normal.  Neck: Normal range of motion.  Pulmonary/Chest: Effort  normal.  Neurological: She is alert and oriented to person, place, and time. Gait normal.  Skin: Skin is warm and dry.  Right anterior flank abscess - decreased erythema and improved induration - incision with surrounding necrotic tissue along the edges -   Psychiatric: Mood, memory, affect and judgment normal.  Vitals reviewed.  Procedure:  Consent obtained.  I was present through the entire procedure while the necrotic tissue was removed by the PA student - the area was localed numbed and the necrotic tissue was removed with a #11 blade.  Wound was repacked and still tracked medial and inferiorly to incision.  The wound is app the size of a quarter after the necrotic tissue was removed. Assessment and Plan :  Cellulitis and abscess of trunk - Plan: mupirocin ointment (BACTROBAN) 2 %   Continue oral abx.  Start bactroban nasal ointment because of past MRSA infections and her current infection being MRSA. Wound recheck in 48h.  Windell Hummingbird PA-C  [facility] 06/17/2015 3:07 PM

## 2015-06-17 NOTE — Patient Instructions (Addendum)
Use the ointment up your nose 2x/day for the next 2 days Recheck on thursday  Because you received labwork today, you will receive an invoice from [employer]. Please contact [facility] at [phone] with questions or concerns regarding your invoice. Our billing staff will not be able to assist you with those questions.  You will be contacted with the lab results as soon as they are available. The fastest way to get your results is to activate your My Chart account. Instructions are located on the last page of this paperwork. If you have not heard from [place] regarding the results in 2 weeks, please contact this office.

## 2015-06-19 ENCOUNTER — Ambulatory Visit (INDEPENDENT_AMBULATORY_CARE_PROVIDER_SITE_OTHER): Payer: Managed Care, Other (non HMO) | Admitting: Physician Assistant

## 2015-06-19 VITALS — BP 152/90 | HR 96 | Temp 99.2°F | Resp 20

## 2015-06-19 DIAGNOSIS — Z5189 Encounter for other specified aftercare: Secondary | ICD-10-CM

## 2015-06-19 NOTE — Patient Instructions (Signed)
     IF you received an x-ray today, you will receive an invoice from [facility]. Please contact [facility] at [phone] with questions or concerns regarding your invoice.   IF you received labwork today, you will receive an invoice from [facility]. Please contact [facility] at [phone] with questions or concerns regarding your invoice.   Our billing staff will not be able to assist you with questions regarding bills from these companies.  You will be contacted with the lab results as soon as they are available. The fastest way to get your results is to activate your My Chart account. Instructions are located on the last page of this paperwork. If you have not heard from us regarding the results in 2 weeks, please contact this office.      

## 2015-06-19 NOTE — Progress Notes (Signed)
   06/19/2015 5:31 PM   DOB: 1970-11-17 / MRN: [id number]  SUBJECTIVE:  Barbara [name] is a well appearing 45 y.o. here today for wound care. She denies exquisite tenderness at the site of the wound, nausea, emesis, fever and chills.  She has been compliant with medical therapy and recommendations thus far.   She has No Known Allergies.   She  has a past medical history of Onychia and paronychia of toe; Anemia; Gestational diabetes; PIH (pregnancy induced hypertension); and Postpartum endometritis.    She  reports that she has never smoked. She has never used smokeless tobacco. She reports that she does not drink alcohol or use illicit drugs. She  reports that she currently engages in sexual activity and has had female partners. She reports using the following method of birth control/protection: IUD. The patient  has past surgical history that includes Cesarean section (02-04-1999); Cholecystectomy; and Breast surgery.  Her family history includes Cancer in her mother; Diabetes in her mother; Hypertension in her mother.  Review of Systems  Constitutional: Negative for fever.  Gastrointestinal: Negative for nausea.  Musculoskeletal: Negative for myalgias.  Neurological: Negative for dizziness and headaches.    Problem list and medications reviewed and updated by myself where necessary, and exist elsewhere in the encounter.   OBJECTIVE:  BP 152/90 mmHg  Pulse 96  Temp(Src) 99.2 F (37.3 C) (Oral)  Resp 20  SpO2 98%  LMP 06/15/2015 CrCl cannot be calculated (Patient has no serum creatinine result on file.).  Physical Exam  Constitutional: She is oriented to person, place, and time. She appears well-nourished. No distress.  Eyes: EOM are normal. Pupils are equal, round, and reactive to light.  Cardiovascular: Normal rate.   Pulmonary/Chest: Effort normal.  Abdominal: She exhibits no distension.    Neurological: She is alert and oriented to person, place, and time. No cranial nerve  deficit. Gait normal.  Skin: Skin is dry. She is not diaphoretic.  Psychiatric: She has a normal mood and affect.  Vitals reviewed.   No results found for this or any previous visit (from the past 48 hour(s)).  ASSESSMENT AND PLAN  Barbara [name] was seen today for wound care.  Diagnoses and all orders for this visit:  Encounter for wound care: Wound is deep however she is progressing well.  Wound repacked and she is to RTC in 48 hours.     The patient was advised to call or return to clinic if she does not see an improvement in symptoms or to seek the care of the closest emergency department if she worsens with the above plan.   Philis Fendt, MHS, PA-C [facility] 06/19/2015 5:31 PM

## 2015-06-21 ENCOUNTER — Ambulatory Visit (INDEPENDENT_AMBULATORY_CARE_PROVIDER_SITE_OTHER): Payer: Managed Care, Other (non HMO) | Admitting: Physician Assistant

## 2015-06-21 VITALS — BP 134/82 | HR 81 | Temp 98.6°F | Resp 16 | Ht 69.0 in | Wt 221.8 lb

## 2015-06-21 DIAGNOSIS — Z5189 Encounter for other specified aftercare: Secondary | ICD-10-CM

## 2015-06-21 NOTE — Progress Notes (Signed)
   06/21/2015 9:10 AM   DOB: September 27, 1970 / MRN: [id number]  SUBJECTIVE:  Barbara [name] is a well appearing 45 y.o. here today for wound care. She denies exquisite tenderness at the site of the wound, nausea, emesis, fever and chills.  She has been compliant with medical therapy and recommendations thus far.   She has No Known Allergies.   She  has a past medical history of Onychia and paronychia of toe; Anemia; Gestational diabetes; PIH (pregnancy induced hypertension); and Postpartum endometritis.    She  reports that she has never smoked. She has never used smokeless tobacco. She reports that she does not drink alcohol or use illicit drugs. She  reports that she currently engages in sexual activity and has had female partners. She reports using the following method of birth control/protection: IUD. The patient  has past surgical history that includes Cesarean section (02-04-1999); Cholecystectomy; and Breast surgery.  Her family history includes Cancer in her mother; Diabetes in her mother; Hypertension in her mother.  ROS  Per HPI  Problem list and medications reviewed and updated by myself where necessary, and exist elsewhere in the encounter.   OBJECTIVE:  BP 134/82 mmHg  Pulse 81  Temp(Src) 98.6 F (37 C) (Oral)  Resp 16  Ht 5\' 9"  (1.753 m)  Wt 221 lb 12.8 oz (100.608 kg)  BMI 32.74 kg/m2  SpO2 98%  LMP 06/15/2015 CrCl cannot be calculated (Patient has no serum creatinine result on file.).  Physical Exam  Constitutional: She is oriented to person, place, and time. She appears well-nourished. No distress.  Eyes: EOM are normal. Pupils are equal, round, and reactive to light.  Cardiovascular: Normal rate.   Pulmonary/Chest: Effort normal.  Abdominal: She exhibits no distension.    Neurological: She is alert and oriented to person, place, and time. No cranial nerve deficit. Gait normal.  Skin: Skin is dry. She is not diaphoretic.  Psychiatric: She has a normal mood and affect.    Vitals reviewed.   No results found for this or any previous visit (from the past 48 hour(s)).  ASSESSMENT AND PLAN  Barbara [name] was seen today for follow-up.  Diagnoses and all orders for this visit:  Encounter for wound care:  Wound repacked lightly. She is no longer tender.  The wound is resolving.  I anticipate that she will need one more check only.  She has three days of Abx left and will continue.  RTC in 72 hours for recheck.    The patient was advised to call or return to clinic if she does not see an improvement in symptoms or to seek the care of the closest emergency department if she worsens with the above plan.   Philis Fendt, MHS, PA-C [facility] 06/21/2015 9:10 AM

## 2015-06-21 NOTE — Patient Instructions (Signed)
     IF you received an x-ray today, you will receive an invoice from [facility]. Please contact [facility] at [phone] with questions or concerns regarding your invoice.   IF you received labwork today, you will receive an invoice from [facility]. Please contact [facility] at [phone] with questions or concerns regarding your invoice.   Our billing staff will not be able to assist you with questions regarding bills from these companies.  You will be contacted with the lab results as soon as they are available. The fastest way to get your results is to activate your My Chart account. Instructions are located on the last page of this paperwork. If you have not heard from us regarding the results in 2 weeks, please contact this office.      

## 2015-06-25 ENCOUNTER — Ambulatory Visit (INDEPENDENT_AMBULATORY_CARE_PROVIDER_SITE_OTHER): Payer: Managed Care, Other (non HMO) | Admitting: Physician Assistant

## 2015-06-25 DIAGNOSIS — Z5189 Encounter for other specified aftercare: Secondary | ICD-10-CM

## 2015-06-25 MED ORDER — MUPIROCIN 2 % EX OINT: 1.0000 "application " | TOPICAL_OINTMENT | Freq: Every day | CUTANEOUS | Status: DC

## 2015-06-25 NOTE — Progress Notes (Signed)
   06/25/2015 5:15 PM   DOB: 1970/05/21 / MRN: [id number]  SUBJECTIVE:  Barbara [name] is a 45 y.o. female presenting for wound recheck.  She has completed her ABx without difficulty.  She denies pain at the sight of the wound.   She has No Known Allergies.   She  has a past medical history of Onychia and paronychia of toe; Anemia; Gestational diabetes; PIH (pregnancy induced hypertension); and Postpartum endometritis.    She  reports that she has never smoked. She has never used smokeless tobacco. She reports that she does not drink alcohol or use illicit drugs. She  reports that she currently engages in sexual activity and has had female partners. She reports using the following method of birth control/protection: IUD. The patient  has past surgical history that includes Cesarean section (02-04-1999); Cholecystectomy; and Breast surgery.  Her family history includes Cancer in her mother; Diabetes in her mother; Hypertension in her mother.  Review of Systems  Constitutional: Negative for fever and chills.  Gastrointestinal: Negative for nausea.  Skin: Negative for rash.  Neurological: Negative for dizziness and headaches.    Problem list and medications reviewed and updated by myself where necessary, and exist elsewhere in the encounter.   OBJECTIVE:  LMP 06/15/2015  Physical Exam  Constitutional: She is oriented to person, place, and time. She appears well-nourished. No distress.  Eyes: EOM are normal. Pupils are equal, round, and reactive to light.  Cardiovascular: Normal rate.   Pulmonary/Chest: Effort normal.  Abdominal: She exhibits no distension.    Neurological: She is alert and oriented to person, place, and time. No cranial nerve deficit. Gait normal.  Skin: Skin is dry. She is not diaphoretic.  Psychiatric: She has a normal mood and affect.  Vitals reviewed.   No results found for this or any previous visit (from the past 72 hour(s)).  No results  found.  ASSESSMENT AND PLAN  Barbara [name] was seen today for follow-up.  Diagnoses and all orders for this visit:  Encounter for wound care: Packing pulled.  Advised daily dressing changes and topical mupirocin.  RTC PRN.  -     mupirocin ointment (BACTROBAN) 2 %; Apply 1 application topically daily.    The patient was advised to call or return to clinic if she does not see an improvement in symptoms or to seek the care of the closest emergency department if she worsens with the above plan.   Philis Fendt, MHS, PA-C [facility] 06/25/2015 5:15 PM

## 2015-12-02 ENCOUNTER — Ambulatory Visit (INDEPENDENT_AMBULATORY_CARE_PROVIDER_SITE_OTHER)
Admission: RE | Admit: 2015-12-02 | Discharge: 2015-12-02 | Disposition: A | Discharge status: Home / Self Care | Payer: BLUE CROSS/BLUE SHIELD | Source: Ambulatory Visit | Attending: Family Medicine | Admitting: Family Medicine

## 2015-12-02 ENCOUNTER — Ambulatory Visit (INDEPENDENT_AMBULATORY_CARE_PROVIDER_SITE_OTHER): Payer: BLUE CROSS/BLUE SHIELD | Admitting: Family Medicine

## 2015-12-02 ENCOUNTER — Encounter: Payer: Self-pay | Admitting: Family Medicine

## 2015-12-02 VITALS — BP 124/64 | HR 99 | Temp 98.9°F | Wt 222.8 lb

## 2015-12-02 DIAGNOSIS — G8929 Other chronic pain: Secondary | ICD-10-CM | POA: Insufficient documentation

## 2015-12-02 DIAGNOSIS — M25562 Pain in left knee: Secondary | ICD-10-CM | POA: Diagnosis not present

## 2015-12-02 MED ORDER — TOLTERODINE TARTRATE 2 MG PO TABS: 1.0000 mg | ORAL_TABLET | Freq: Two times a day (BID) | ORAL | 3 refills | Status: DC

## 2015-12-02 NOTE — Progress Notes (Signed)
Pre visit review using our clinic review tool, if applicable. No additional management support is needed unless otherwise documented below in the visit note. 

## 2015-12-02 NOTE — Patient Instructions (Signed)
Great to see you.  I will call you with your xray results. 

## 2015-12-02 NOTE — Progress Notes (Signed)
SUBJECTIVE: Barbara [name] is a 45 y.o. female who sustained a left knee injury 1 week(s) ago. Mechanism of injury: walking and bending all day for several days at [employer]. Immediate symptoms: immediate pain, immediate swelling. Symptoms have been constant since that time. Prior history of related problems: previous knee injury had to see Dr.Copland in 2012- joint aspiration.  No current outpatient prescriptions on file prior to visit.   No current facility-administered medications on file prior to visit.     No Known Allergies  Past Medical History:  Diagnosis Date  . Anemia    acute blood loss in High school  . Gestational diabetes   . Onychia and paronychia of toe   . PIH (pregnancy induced hypertension)    severe, required Mgs04  . Postpartum endometritis     Past Surgical History:  Procedure Laterality Date  . BREAST SURGERY    . CESAREAN SECTION  02-04-1999    vbac 1-06 and 05-19-2005  . CHOLECYSTECTOMY      Family History  Problem Relation Age of Onset  . Diabetes Mother   . Cancer Mother     breast and lung  . Hypertension Mother     Social History   Social History  . Marital status: Married    Spouse name: N/A  . Number of children: 3  . Years of education: N/A   Occupational History  . 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 Smoking status: Never Smoker  . Smokeless tobacco: Never Used  . Alcohol use No  . Drug use: No  . Sexual activity: Yes    Partners: Male    Birth control/ protection: IUD     Comment: MIRENA PLACED 05/2008 (PCP)   Other Topics Concern  . Not on file   Social History Narrative   Married to [place], had son Barbara [name] 11-00, Barbara [name], and Barbara [name]   The PMH, PSH, Social History, Family History, Medications, and allergies have been reviewed in [facility], and have been updated if relevant.  OBJECTIVE: BP 124/64   Pulse 99   Temp 98.9 F (37.2 C) (Oral)   Wt 222 lb 12 oz (101 kg)   LMP  12/01/2015   SpO2 97%   BMI 32.89 kg/m   Vital signs as noted above. Appearance: alert, well appearing, and in no distress. Knee exam: effusion, reduced range of motion, patellar tenderness. X-ray: ordered, but results not yet available.  ASSESSMENT: Knee internal derangement  PLAN: rest the injured area as much as practical, X-Ray ordered See orders for this visit as documented in the electronic medical record.

## 2015-12-04 ENCOUNTER — Encounter: Payer: Self-pay | Admitting: Family Medicine

## 2015-12-04 ENCOUNTER — Ambulatory Visit (INDEPENDENT_AMBULATORY_CARE_PROVIDER_SITE_OTHER): Payer: BLUE CROSS/BLUE SHIELD | Admitting: Family Medicine

## 2015-12-04 VITALS — BP 150/90 | HR 83 | Temp 98.8°F | Ht 69.0 in | Wt 223.2 lb

## 2015-12-04 DIAGNOSIS — M25562 Pain in left knee: Secondary | ICD-10-CM | POA: Diagnosis not present

## 2015-12-04 MED ORDER — METHYLPREDNISOLONE ACETATE 40 MG/ML IJ SUSP
80.0000 mg | Freq: Once | INTRAMUSCULAR | Status: AC
  Administered 2015-12-04: 80 mg via INTRA_ARTICULAR

## 2015-12-04 NOTE — Progress Notes (Signed)
Dr. Frederico Hamman T. Percy Winterrowd, MD, [facility] [place], [address] Phone: [phone] Fax: [phone]  12/04/2015  Patient: Barbara [name], MRN: [id number], DOB: July 23, 1970, 45 y.o.  Primary Physician:  Arnette Norris, MD   Chief Complaint  Patient presents with  . Knee Pain    Bilateral-Left is worse   Subjective:   Barbara [name] is a 45 y.o. very pleasant female patient who presents with the following:  Pleasant patient presents with bilateral knee pain.  Her left knee is significantly worse on her right knee.  No specific injury.  Upon review of the patient's radiographs, she has moderate degree medial compartmental osteoarthritis at the age of 78.  No known significant trauma.  I did aspirate one of her knees greater than 5 years ago, and this was the right knee. At that point, did do a basic rheumatological workup, and diligent in the came back positive was an ANA at a 1:80 titration.  Past Medical History, Surgical History, Social History, Family History, Problem List, Medications, and Allergies have been reviewed and updated if relevant.  Patient Active Problem List   Diagnosis Date Noted  . Left knee pain 12/02/2015  . Pain and swelling of toe of right foot 03/20/2015  . Anemia 01/14/2015  . Frequent headaches 02/18/2014  . Insomnia 02/18/2014  . Stress incontinence in female 01/10/2014  . Elevated blood sugar 10/31/2012  . History of gestational diabetes 10/26/2012  . HLD (hyperlipidemia) 10/23/2009  . HYPERTENSION, BENIGN SYSTEMIC 06/02/2006    Past Medical History:  Diagnosis Date  . Anemia    acute blood loss in High school  . Gestational diabetes   . Onychia and paronychia of toe   . PIH (pregnancy induced hypertension)    severe, required Mgs04  . Postpartum endometritis     Past Surgical History:  Procedure Laterality Date  . BREAST SURGERY    . CESAREAN SECTION  02-04-1999    vbac  1-06 and 05-19-2005  . CHOLECYSTECTOMY      Social History   Social History  . Marital status: Married    Spouse name: N/A  . Number of children: 3  . Years of education: N/A   Occupational History  . 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 Smoking status: Never Smoker  . Smokeless tobacco: Never Used  . Alcohol use No  . Drug use: No  . Sexual activity: Yes    Partners: Male    Birth control/ protection: IUD     Comment: MIRENA PLACED 05/2008 (PCP)   Other Topics Concern  . Not on file   Social History Narrative   Married to [place], had son [name] 11-00, [name], and skylar    Family History  Problem Relation Age of Onset  . Diabetes Mother   . Cancer Mother     breast and lung  . Hypertension Mother     No Known Allergies  Medication list reviewed and updated in full in [name].  GEN: No fevers, chills. Nontoxic. Primarily MSK c/o today. MSK: Detailed in the HPI GI: tolerating PO intake without difficulty Neuro: No numbness, parasthesias, or tingling associated. Otherwise the pertinent positives of the ROS are noted above.   Objective:   BP (!) 150/90   Pulse 83   Temp 98.8 F (37.1 C) (Oral)   Ht 5\' 9"  (1.753 m)   Wt 223 lb 4 oz (  101.3 kg)   LMP 12/01/2015 Comment: husband had a vacectomy   BMI 32.97 kg/m    GEN: WDWN, NAD, Non-toxic, Alert & Oriented x 3 HEENT: Atraumatic, Normocephalic.  Ears and Nose: No external deformity. EXTR: No clubbing/cyanosis/edema NEURO: Normal gait.  PSYCH: Normally interactive. Conversant. Not depressed or anxious appearing.  Calm demeanor.   Knee:  L Gait: Normal heel toe pattern ROM: 0-115 Effusion: mild Echymosis or edema: none Patellar tendon NT Painful PLICA: neg Patellar grind: negative Medial and lateral patellar facet loading: negative medial and lateral joint lines: ttp medially Mcmurray's neg Flexion-pinch pain Varus and valgus stress:  stable Lachman: neg Ant and Post drawer: neg Hip abduction, IR, ER: WNL Hip flexion str: 5/5 Hip abd: 5/5 Quad: 5/5 VMO atrophy:No Hamstring concentric and eccentric: 5/5   Radiology: Dg Knee Ap/lat W/sunrise Left  Result Date: 12/02/2015 CLINICAL DATA:  Left knee pain.  No known injury. EXAM: LEFT KNEE 3 VIEWS COMPARISON:  12/24/2014 FINDINGS: No evidence of fracture or dislocation. Small knee joint effusion is noted. Moderate joint space narrowing and mild degenerative spurring seen involving the medial compartment. Degenerative spurring also seen involving the patella. IMPRESSION: Osteoarthritis with predominant involvement of medial compartment. Small knee joint effusion. No acute osseous abnormality. Electronically Signed   By: Earle Gell M.D.   On: 12/02/2015 08:41    Assessment and Plan:   Knee pain, left - Plan: methylPREDNISolone acetate (DEPO-MEDROL) injection 80 mg  Relatively advanced osteoarthritic changes for age.  Continue basic conservative care, and we will aspirate her knee today.  Knee Aspiration and Injection, L Patient verbally consented; risks, benefits, and alternatives explained including possible infection. Patient prepped with Chloraprep. Ethyl chloride for anesthesia. 10 cc of 1% Lidocaine used in wheal then injected Subcutaneous fashion with 22 gauge needle on lateral approach. Under sterilne conditions, 18 gauge needle used via lateral approach to aspirate 20 cc of clear, yellowish fluid. Then 8 cc of Lidocaine 1% and 2 mL of Depo-Medrol 40 mg injected. Tolerated well, decreased pain, no complications.   Follow-up: prn  Signed,  Demetria Lightsey T. Nalia Honeycutt, MD   Patient's Medications  New Prescriptions   No medications on file  Previous Medications   TOLTERODINE (DETROL) 2 MG TABLET    Take 0.5 tablets (1 mg total) by mouth 2 (two) times daily.  Modified Medications   No medications on file  Discontinued Medications   No medications on file

## 2015-12-04 NOTE — Progress Notes (Signed)
Pre visit review using our clinic review tool, if applicable. No additional management support is needed unless otherwise documented below in the visit note. 

## 2015-12-10 ENCOUNTER — Other Ambulatory Visit: Payer: Self-pay | Admitting: Family Medicine

## 2015-12-10 DIAGNOSIS — Z1231 Encounter for screening mammogram for malignant neoplasm of breast: Secondary | ICD-10-CM

## 2016-01-02 ENCOUNTER — Other Ambulatory Visit: Payer: Self-pay | Admitting: Family Medicine

## 2016-01-02 DIAGNOSIS — Z01419 Encounter for gynecological examination (general) (routine) without abnormal findings: Secondary | ICD-10-CM

## 2016-01-07 ENCOUNTER — Ambulatory Visit: Payer: BLUE CROSS/BLUE SHIELD

## 2016-01-08 ENCOUNTER — Ambulatory Visit
Admission: RE | Admit: 2016-01-08 | Discharge: 2016-01-08 | Disposition: A | Discharge status: Home / Self Care | Payer: BLUE CROSS/BLUE SHIELD | Source: Ambulatory Visit | Attending: Family Medicine | Admitting: Family Medicine

## 2016-01-08 DIAGNOSIS — Z1231 Encounter for screening mammogram for malignant neoplasm of breast: Secondary | ICD-10-CM

## 2016-01-12 ENCOUNTER — Other Ambulatory Visit (INDEPENDENT_AMBULATORY_CARE_PROVIDER_SITE_OTHER): Payer: BLUE CROSS/BLUE SHIELD

## 2016-01-12 DIAGNOSIS — Z01419 Encounter for gynecological examination (general) (routine) without abnormal findings: Secondary | ICD-10-CM | POA: Diagnosis not present

## 2016-01-12 LAB — CBC WITH DIFFERENTIAL/PLATELET
BASOS PCT: 0.4 % (ref 0.0–3.0)
Basophils Absolute: 0 10*3/uL (ref 0.0–0.1)
EOS ABS: 0.1 10*3/uL (ref 0.0–0.7)
EOS PCT: 1 % (ref 0.0–5.0)
HEMATOCRIT: 32.6 % — AB (ref 36.0–46.0)
HEMOGLOBIN: 10.8 g/dL — AB (ref 12.0–15.0)
LYMPHS PCT: 31.8 % (ref 12.0–46.0)
Lymphs Abs: 1.6 10*3/uL (ref 0.7–4.0)
MCHC: 33 g/dL (ref 30.0–36.0)
MCV: 84.1 fl (ref 78.0–100.0)
MONO ABS: 0.6 10*3/uL (ref 0.1–1.0)
Monocytes Relative: 12.4 % — ABNORMAL HIGH (ref 3.0–12.0)
Neutro Abs: 2.8 10*3/uL (ref 1.4–7.7)
Neutrophils Relative %: 54.4 % (ref 43.0–77.0)
Platelets: 362 10*3/uL (ref 150.0–400.0)
RBC: 3.88 Mil/uL (ref 3.87–5.11)
RDW: 16 % — AB (ref 11.5–15.5)
WBC: 5.1 10*3/uL (ref 4.0–10.5)

## 2016-01-12 LAB — LIPID PANEL
CHOLESTEROL: 200 mg/dL (ref 0–200)
HDL: 57.3 mg/dL (ref >39.00)
LDL Cholesterol: 132 mg/dL — ABNORMAL HIGH (ref 0–99)
NonHDL: 143.17
TRIGLYCERIDES: 58 mg/dL (ref 0.0–149.0)
Total CHOL/HDL Ratio: 3
VLDL: 11.6 mg/dL (ref 0.0–40.0)

## 2016-01-12 LAB — COMPREHENSIVE METABOLIC PANEL
ALBUMIN: 3.9 g/dL (ref 3.5–5.2)
ALK PHOS: 67 U/L (ref 39–117)
ALT: 16 U/L (ref 0–35)
AST: 17 U/L (ref 0–37)
BUN: 13 mg/dL (ref 6–23)
CALCIUM: 9.4 mg/dL (ref 8.4–10.5)
CHLORIDE: 106 meq/L (ref 96–112)
CO2: 27 mEq/L (ref 19–32)
CREATININE: 1 mg/dL (ref 0.40–1.20)
GFR: 77.65 mL/min (ref >60.00)
Glucose, Bld: 118 mg/dL — ABNORMAL HIGH (ref 70–99)
Potassium: 4.3 mEq/L (ref 3.5–5.1)
SODIUM: 139 meq/L (ref 135–145)
TOTAL PROTEIN: 7.3 g/dL (ref 6.0–8.3)
Total Bilirubin: 0.3 mg/dL (ref 0.2–1.2)

## 2016-01-12 LAB — TSH: TSH: 1.91 u[IU]/mL (ref 0.35–4.50)

## 2016-01-15 ENCOUNTER — Ambulatory Visit (INDEPENDENT_AMBULATORY_CARE_PROVIDER_SITE_OTHER): Payer: BLUE CROSS/BLUE SHIELD | Admitting: Family Medicine

## 2016-01-15 ENCOUNTER — Encounter: Payer: Self-pay | Admitting: Family Medicine

## 2016-01-15 ENCOUNTER — Other Ambulatory Visit (HOSPITAL_COMMUNITY)
Admission: RE | Admit: 2016-01-15 | Discharge: 2016-01-15 | Disposition: A | Discharge status: Home / Self Care | Payer: BLUE CROSS/BLUE SHIELD | Source: Ambulatory Visit | Attending: Family Medicine | Admitting: Family Medicine

## 2016-01-15 VITALS — BP 124/70 | HR 71 | Temp 98.7°F | Ht 69.0 in | Wt 216.2 lb

## 2016-01-15 DIAGNOSIS — Z1151 Encounter for screening for human papillomavirus (HPV): Secondary | ICD-10-CM | POA: Diagnosis present

## 2016-01-15 DIAGNOSIS — N760 Acute vaginitis: Secondary | ICD-10-CM | POA: Insufficient documentation

## 2016-01-15 DIAGNOSIS — I10 Essential (primary) hypertension: Secondary | ICD-10-CM

## 2016-01-15 DIAGNOSIS — E785 Hyperlipidemia, unspecified: Secondary | ICD-10-CM | POA: Diagnosis not present

## 2016-01-15 DIAGNOSIS — D649 Anemia, unspecified: Secondary | ICD-10-CM | POA: Diagnosis not present

## 2016-01-15 DIAGNOSIS — Z01419 Encounter for gynecological examination (general) (routine) without abnormal findings: Secondary | ICD-10-CM | POA: Diagnosis present

## 2016-01-15 DIAGNOSIS — Z113 Encounter for screening for infections with a predominantly sexual mode of transmission: Secondary | ICD-10-CM | POA: Insufficient documentation

## 2016-01-15 MED ORDER — TOLTERODINE TARTRATE 2 MG PO TABS: 1.0000 mg | ORAL_TABLET | Freq: Two times a day (BID) | ORAL | 3 refills | Status: DC

## 2016-01-15 NOTE — Assessment & Plan Note (Signed)
Stable

## 2016-01-15 NOTE — Progress Notes (Signed)
Pre visit review using our clinic review tool, if applicable. No additional management support is needed unless otherwise documented below in the visit note. 

## 2016-01-15 NOTE — Patient Instructions (Signed)
Great to see you. Say hi to the family for me.  Congratulations on your new position!

## 2016-01-15 NOTE — Assessment & Plan Note (Signed)
Advised to restart PNV.

## 2016-01-15 NOTE — Addendum Note (Signed)
Addended by: Modena Nunnery on: 01/15/2016 01:39 PM   Modules accepted: Orders

## 2016-01-15 NOTE — Assessment & Plan Note (Signed)
Reviewed preventive care protocols, scheduled due services, and updated immunizations Discussed nutrition, exercise, diet, and healthy lifestyle.  Pap smear done today. 

## 2016-01-15 NOTE — Addendum Note (Signed)
Addended by: Lucille Passy on: 01/15/2016 01:40 PM   Modules accepted: Orders

## 2016-01-15 NOTE — Progress Notes (Signed)
45 yo very pleasant female here for CPX.   Happier with her new position at work.  G3P3 Last pap smear 01/14/15 (done by me) Lab Results  Component Value Date   WBC 5.1 01/12/2016   HGB 10.8 (L) 01/12/2016   HCT 32.6 (L) 01/12/2016   MCV 84.1 01/12/2016   PLT 362.0 01/12/2016    Mammogram 01/08/16  No family h/o breast CA.  She did have benign breast mass removed when she was a teenager.  Hyperlipidemia- improved with diet  Lab Results  Component Value Date   CHOL 200 01/12/2016   HDL 57.30 01/12/2016   LDLCALC 132 (H) 01/12/2016   LDLDIRECT 145.1 10/26/2012   TRIG 58.0 01/12/2016   CHOLHDL 3 01/12/2016    Wt Readings from Last 3 Encounters:  01/15/16 216 lb 4 oz (98.1 kg)  12/04/15 223 lb 4 oz (101.3 kg)  12/02/15 222 lb 12 oz (101 kg)    Lab Results  Component Value Date   HGBA1C 6.2 01/10/2015   Lab Results  Component Value Date   NA 139 01/12/2016   K 4.3 01/12/2016   CL 106 01/12/2016   CO2 27 01/12/2016   Lab Results  Component Value Date   WBC 5.1 01/12/2016   HGB 10.8 (L) 01/12/2016   HCT 32.6 (L) 01/12/2016   MCV 84.1 01/12/2016   PLT 362.0 01/12/2016   Lab Results  Component Value Date   TSH 1.91 01/12/2016     Patient Active Problem List  Diagnosis  . HLD (hyperlipidemia)  . HYPERTENSION, BENIGN SYSTEMIC  . History of gestational diabetes  . Elevated blood sugar  . Stress incontinence in female  . Frequent headaches  . Insomnia  . Anemia  . Well woman exam   Past Medical History:  Diagnosis Date  . Anemia    acute blood loss in High school  . Gestational diabetes   . Onychia and paronychia of toe   . PIH (pregnancy induced hypertension)    severe, required Mgs04  . Postpartum endometritis    Past Surgical History:  Procedure Laterality Date  . BREAST SURGERY    . CESAREAN SECTION  02-04-1999    vbac 1-06 and 05-19-2005  . CHOLECYSTECTOMY     Social History  Substance Use Topics  . Smoking status: Never Smoker  .  Smokeless tobacco: Never Used  . Alcohol use No   Family History  Problem Relation Age of Onset  . Diabetes Mother   . Cancer Mother     breast and lung  . Hypertension Mother    No Known Allergies Current Outpatient Prescriptions on File Prior to Visit  Medication Sig Dispense Refill  . tolterodine (DETROL) 2 MG tablet Take 0.5 tablets (1 mg total) by mouth 2 (two) times daily. 30 tablet 3   No current facility-administered medications on file prior to visit.    The PMH, PSH, Social History, Family History, Medications, and allergies have been reviewed in [facility], and have been updated if relevant.   Review of Systems  Constitutional: Negative.   Eyes: Negative.   Respiratory: Negative.   Cardiovascular: Negative.   Gastrointestinal: Negative.   Endocrine: Negative.   Genitourinary: Negative.   Musculoskeletal: Negative.   Skin: Negative.   Allergic/Immunologic: Negative.   Neurological: Negative.   Hematological: Negative.   Psychiatric/Behavioral: Negative.   All other systems reviewed and are negative.   Physical Exam BP 124/70   Pulse 71   Temp 98.7 F (37.1 C) (Oral)  Ht 5\' 9"  (1.753 m)   Wt 216 lb 4 oz (98.1 kg)   SpO2 99%   BMI 31.93 kg/m  Wt Readings from Last 3 Encounters:  01/15/16 216 lb 4 oz (98.1 kg)  12/04/15 223 lb 4 oz (101.3 kg)  12/02/15 222 lb 12 oz (101 kg)   General:  Well-developed,well-nourished,in no acute distress; alert,appropriate and cooperative throughout examination Head:  normocephalic and atraumatic.   Eyes:  vision grossly intact, pupils equal, pupils round, and pupils reactive to light.   Ears:  R ear normal and L ear normal.   Nose:  no external deformity.   Mouth:  good dentition.   Neck:  No deformities, masses, or tenderness noted. Breasts:  No mass, nodules, thickening, tenderness, bulging, retraction, inflamation, nipple discharge or skin changes noted. Lungs:  Normal respiratory effort, chest expands symmetrically.  Lungs are clear to auscultation, no crackles or wheezes. Heart:  Normal rate and regular rhythm. S1 and S2 normal without gallop, murmur, click, rub or other extra sounds. Abdomen:  Bowel sounds positive,abdomen soft and non-tender without masses, organomegaly or hernias noted. Rectal:  no external abnormalities.   Genitalia:  Pelvic Exam:        External: normal female genitalia without lesions or masses        Vagina: normal without lesions or masses        Cervix: normal without lesions or masses        Adnexa: normal bimanual exam without masses or fullness        Uterus: normal by palpation        Pap smear: performed Msk:  No deformity or scoliosis noted of thoracic or lumbar spine.   Extremities:  No clubbing, cyanosis, edema, or deformity noted with normal full range of motion of all joints.   Neurologic:  alert & oriented X3 and gait normal.   Skin:  Intact without suspicious lesions or rashes Cervical Nodes:  No lymphadenopathy noted Axillary Nodes:  No palpable lymphadenopathy Psych:  Cognition and judgment appear intact. Alert and cooperative with normal attention span and concentration. No apparent delusions, illusions, hallucinations

## 2016-01-16 LAB — CYTOLOGY - PAP

## 2016-01-17 IMAGING — CR DG KNEE COMPLETE 4+V*L*
4 series · 4 of 4 positions shown · non-contrast
Comparison: Limited left knee plain film dated 07/16/2010.

CLINICAL DATA: Knee pain, unknown origin.

EXAM:
LEFT KNEE - COMPLETE 4+ VIEW

[AP]
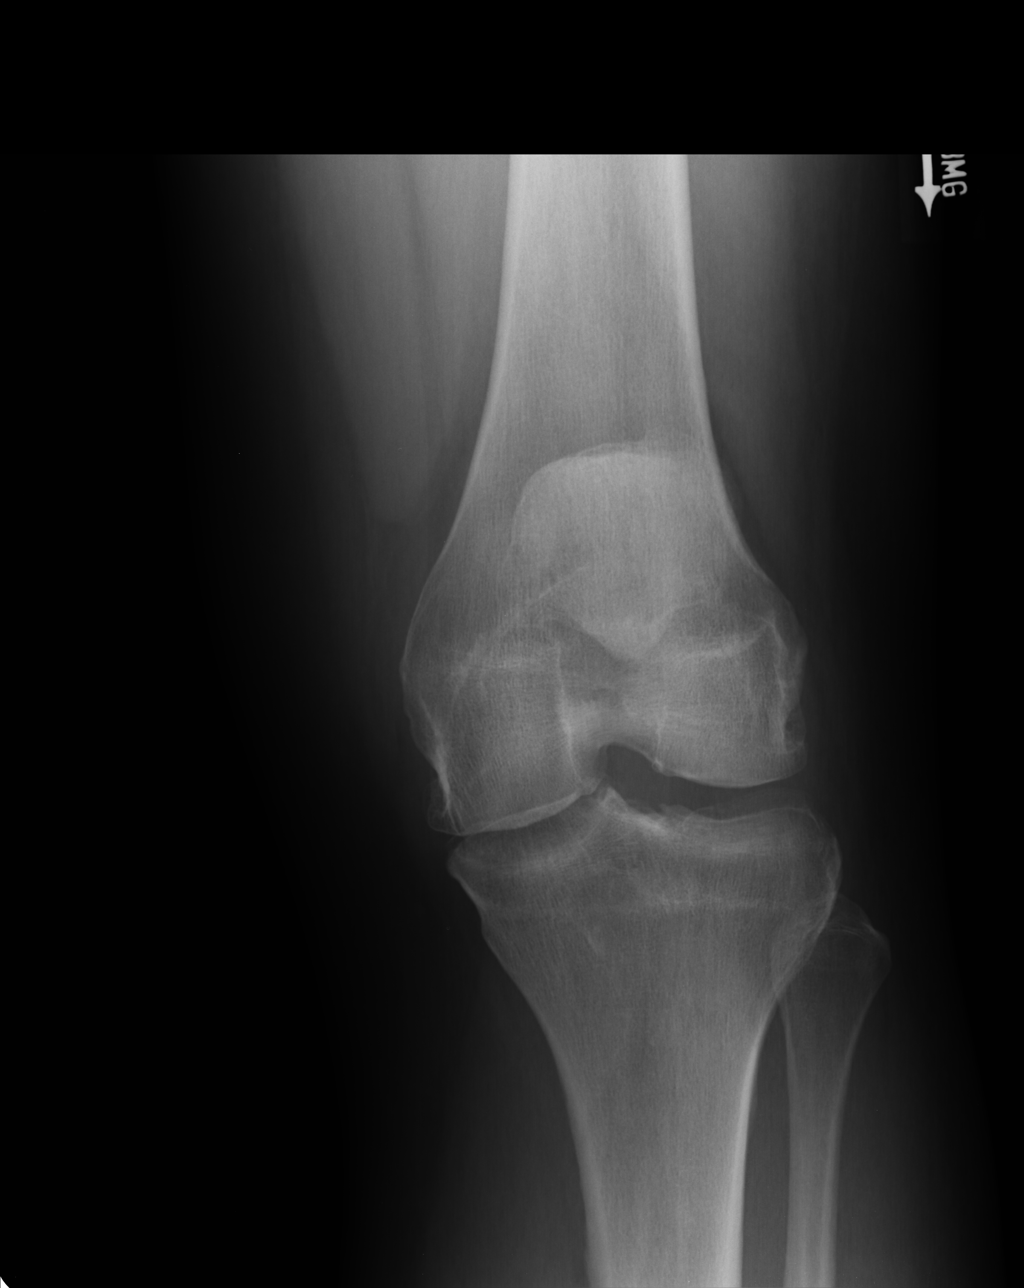

[ap axial]
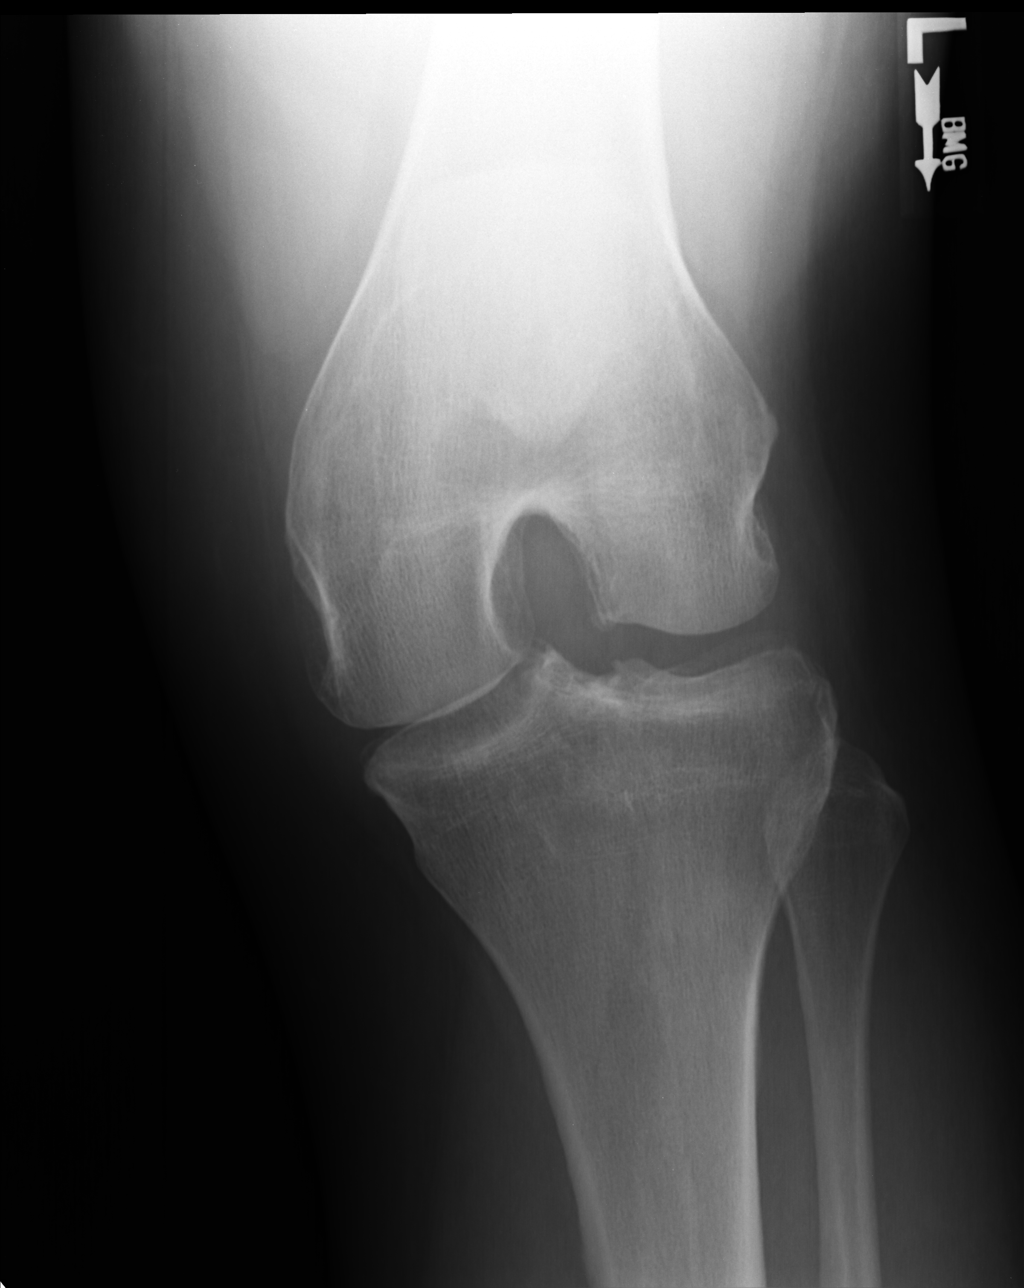

[lateral]
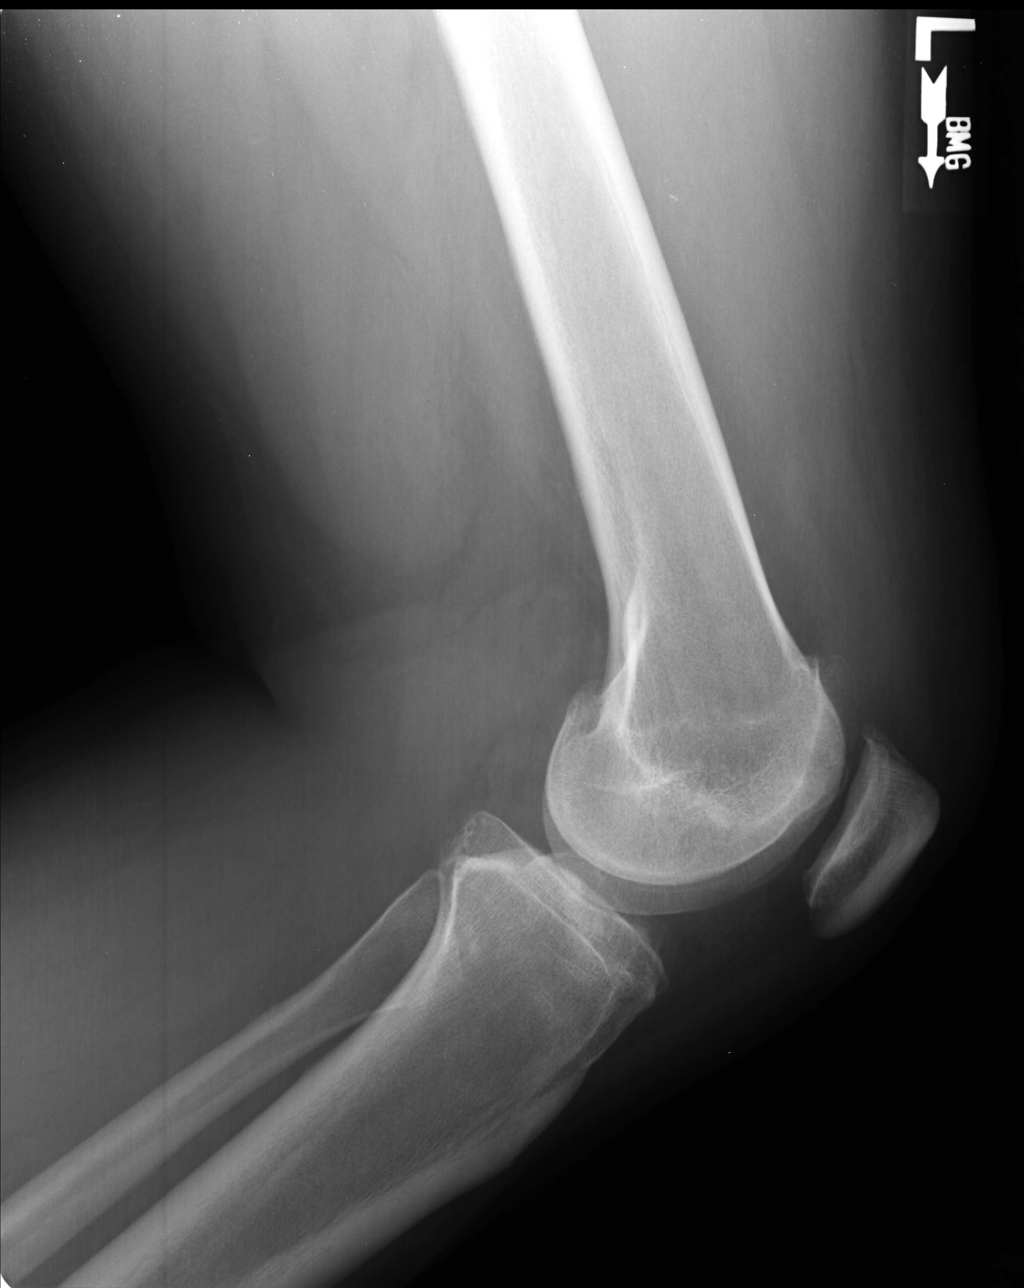

[sunrise]
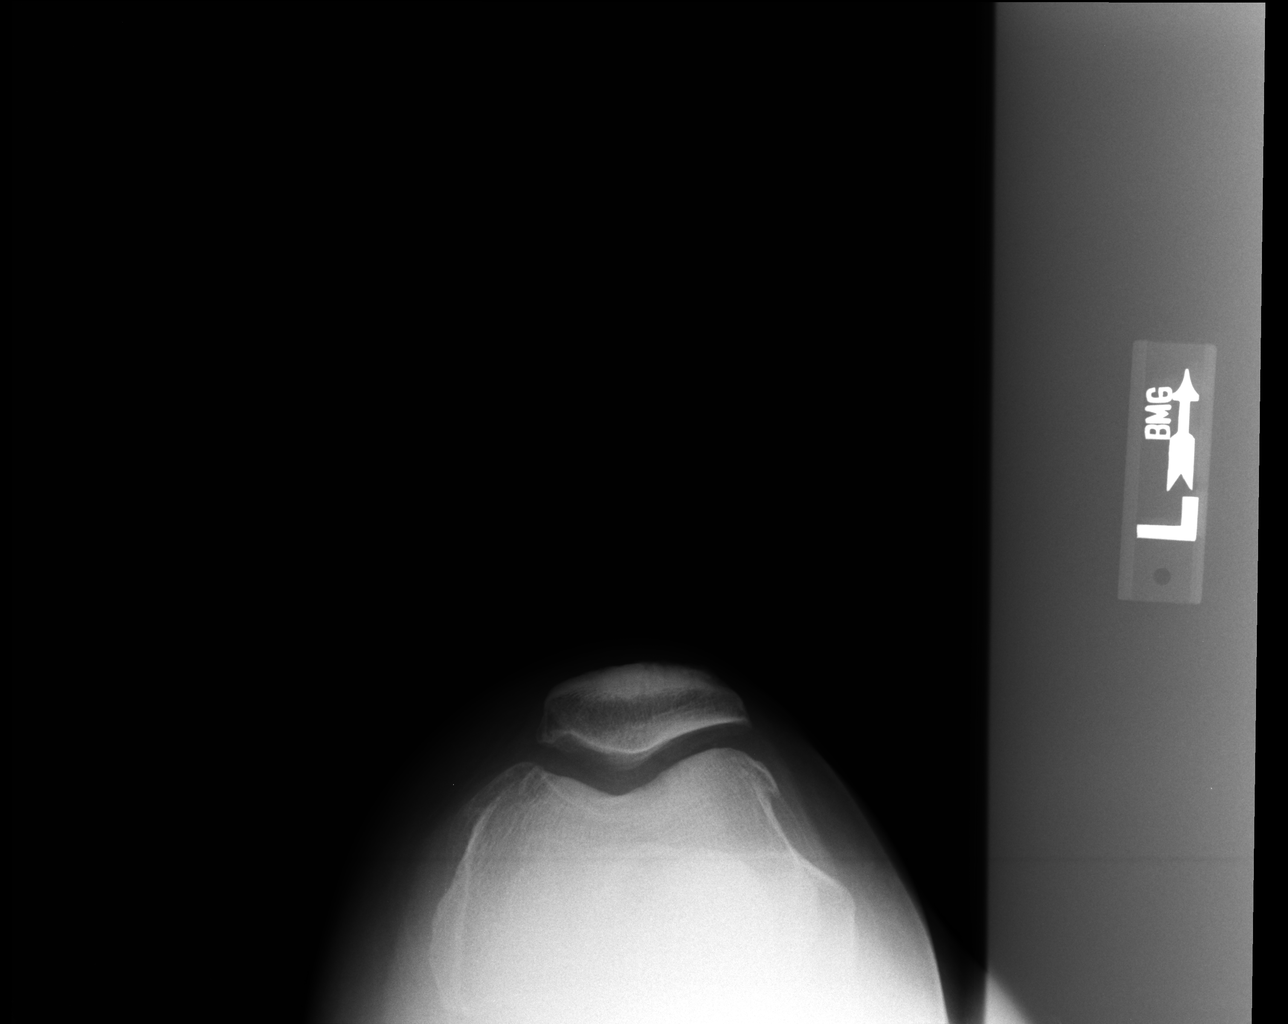

[4 of 4 positions shown; findings below may reference images not displayed]

FINDINGS: There is degenerative narrowing of the medial left knee compartment,
mild to moderate in degree, with associated mild osseous spurring.
There is also mild degenerative narrowing at the patellofemoral
compartment with associated mild osseous spurring. Lateral
compartment is relatively well preserved.

There is no fracture line or displaced fracture fragment. Bone
mineralization is normal. No acute- appearing cortical irregularity
or osseous lesion. Possible joint effusion but not convincing. More
superficial soft tissues about the left knee are unremarkable.
IMPRESSION: 1. Degenerative changes as above.
2. No acute osseous abnormality.

## 2016-01-19 ENCOUNTER — Encounter: Payer: Self-pay | Admitting: *Deleted

## 2016-01-19 LAB — CERVICOVAGINAL ANCILLARY ONLY
BACTERIAL VAGINITIS: NEGATIVE
Candida vaginitis: NEGATIVE

## 2016-01-20 LAB — CERVICOVAGINAL ANCILLARY ONLY: Herpes: NEGATIVE

## 2016-04-05 HISTORY — PX: ABDOMINAL HYSTERECTOMY: SHX81

## 2016-06-07 ENCOUNTER — Encounter: Payer: Self-pay | Admitting: Family Medicine

## 2016-06-07 ENCOUNTER — Ambulatory Visit (INDEPENDENT_AMBULATORY_CARE_PROVIDER_SITE_OTHER): Payer: BLUE CROSS/BLUE SHIELD | Admitting: Family Medicine

## 2016-06-07 DIAGNOSIS — N938 Other specified abnormal uterine and vaginal bleeding: Secondary | ICD-10-CM

## 2016-06-07 DIAGNOSIS — Z86018 Personal history of other benign neoplasm: Secondary | ICD-10-CM | POA: Diagnosis not present

## 2016-06-07 NOTE — Assessment & Plan Note (Signed)
Bleeding seems to be slowing down now. ? If her fibroids are growing and causing increased DUB and cramping. Will order Pelvic US for later this week or next week since bleeding seems to be stopping. The patient indicates understanding of these issues and agrees with the plan. Call or return to clinic prn if these symptoms worsen or fail to improve as anticipated. Orders Placed This Encounter  Procedures  . US Transvaginal Non-OB  . US Pelvis Complete

## 2016-06-07 NOTE — Patient Instructions (Signed)
Great to see you. Please stop by to see Marion on your way out.   

## 2016-06-07 NOTE — Progress Notes (Signed)
Pre visit review using our clinic review tool, if applicable. No additional management support is needed unless otherwise documented below in the visit note. 

## 2016-06-07 NOTE — Progress Notes (Signed)
Subjective:   Patient ID: Estill Bakes, female    DOB: [birth year], 46 y.o.   MRN: [id number]  ANDJELA HJORTH is a pleasant 46 y.o. year old female who presents to clinic today with Irregular Periods, Cramping (Just finished cycle on the 21st. Started again 06-04-16. Has Uterine Fibroids. She does not have a regular GYN.)  on 06/07/2016  HPI:  This month has had two periods which has never happened before.  Increased cramping and heaviness with the second period.  She is still bleeding but it is lighter. Her husband has had a vasectomy so she is no longer on birth control.  She does have a history of uterine fibroids.  No current outpatient prescriptions on file prior to visit.   No current facility-administered medications on file prior to visit.     No Known Allergies  Past Medical History:  Diagnosis Date  . Anemia    acute blood loss in High school  . Gestational diabetes   . Onychia and paronychia of toe   . PIH (pregnancy induced hypertension)    severe, required Mgs04  . Postpartum endometritis     Past Surgical History:  Procedure Laterality Date  . BREAST SURGERY    . CESAREAN SECTION  02-04-1999    vbac 1-06 and 05-19-2005  . CHOLECYSTECTOMY      Family History  Problem Relation Age of Onset  . Diabetes Mother   . Cancer Mother     breast and lung  . Hypertension Mother     Social History   Social History  . Marital status: Married    Spouse name: N/A  . Number of children: 3  . 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 Smoking status: Never Smoker  . Smokeless tobacco: Never Used  . Alcohol use No  . Drug use: No  . Sexual activity: Yes    Partners: Male    Birth control/ protection: IUD     Comment: MIRENA PLACED 05/2008 (PCP)   Other Topics Concern  . Not on file   Social History Narrative   Married to [place], had son [name] 11-00, jayden,  and skylar   The PMH, PSH, Social History, Family History, Medications, and allergies have been reviewed in [facility], and have been updated if relevant.  Review of Systems  Genitourinary: Positive for menstrual problem, pelvic pain and vaginal bleeding. Negative for decreased urine volume, difficulty urinating, dyspareunia, dysuria, enuresis, flank pain, frequency, genital sores, hematuria, urgency, vaginal discharge and vaginal pain.  All other systems reviewed and are negative.      Objective:    BP 122/70 (BP Location: Left Arm, Patient Position: Sitting, Cuff Size: Large)   Pulse 84   Temp 98.6 F (37 C) (Oral)   Wt 217 lb (98.4 kg)   LMP 06/04/2016   SpO2 97%   BMI 32.05 kg/m    Physical Exam  Constitutional: She is oriented to person, place, and time. She appears well-developed and well-nourished. No distress.  HENT:  Head: Normocephalic and atraumatic.  Cardiovascular: Normal rate.   Pulmonary/Chest: Effort normal.  Abdominal: Soft. She exhibits no distension and no mass. There is no tenderness. There is no rebound and no guarding.  Musculoskeletal: Normal range of motion.  Neurological: She is alert and oriented to person, place, and time. No cranial nerve deficit.  Skin: Skin is warm and dry. She is not  diaphoretic.  Psychiatric: She has a normal mood and affect. Her behavior is normal. Judgment and thought content normal.  Nursing note and vitals reviewed.         Assessment & Plan:   Dysfunctional uterine bleeding - Plan: US Transvaginal Non-OB, US Pelvis Complete  History of uterine fibroid No Follow-up on file.

## 2016-06-14 ENCOUNTER — Ambulatory Visit
Admission: RE | Admit: 2016-06-14 | Discharge: 2016-06-14 | Disposition: A | Discharge status: Home / Self Care | Payer: BLUE CROSS/BLUE SHIELD | Source: Ambulatory Visit | Attending: Family Medicine | Admitting: Family Medicine

## 2016-06-14 DIAGNOSIS — N938 Other specified abnormal uterine and vaginal bleeding: Secondary | ICD-10-CM

## 2016-06-15 ENCOUNTER — Other Ambulatory Visit: Payer: Self-pay | Admitting: Family Medicine

## 2016-06-15 DIAGNOSIS — N838 Other noninflammatory disorders of ovary, fallopian tube and broad ligament: Secondary | ICD-10-CM

## 2016-06-21 ENCOUNTER — Ambulatory Visit (INDEPENDENT_AMBULATORY_CARE_PROVIDER_SITE_OTHER)
Admission: RE | Admit: 2016-06-21 | Discharge: 2016-06-21 | Disposition: A | Discharge status: Home / Self Care | Payer: BLUE CROSS/BLUE SHIELD | Source: Ambulatory Visit | Attending: Family Medicine | Admitting: Family Medicine

## 2016-06-21 DIAGNOSIS — N838 Other noninflammatory disorders of ovary, fallopian tube and broad ligament: Secondary | ICD-10-CM

## 2016-06-21 DIAGNOSIS — N839 Noninflammatory disorder of ovary, fallopian tube and broad ligament, unspecified: Secondary | ICD-10-CM

## 2016-06-21 MED ORDER — IOPAMIDOL (ISOVUE-300) INJECTION 61%
100.0000 mL | Freq: Once | INTRAVENOUS | Status: AC | PRN
  Administered 2016-06-21: 100 mL via INTRAVENOUS

## 2016-06-22 ENCOUNTER — Other Ambulatory Visit: Payer: Self-pay | Admitting: Family Medicine

## 2016-06-22 DIAGNOSIS — D369 Benign neoplasm, unspecified site: Secondary | ICD-10-CM

## 2016-06-25 ENCOUNTER — Telehealth: Payer: Self-pay | Admitting: Family Medicine

## 2016-06-25 NOTE — Telephone Encounter (Signed)
Dr Deborra Medina Is patient aware of the ct results.  I'm just checking before I call her about her gyn referral

## 2016-06-28 NOTE — Telephone Encounter (Signed)
Yes she is aware.

## 2016-09-10 NOTE — H&P (Signed)
46 year old G 4 P 3 with left ovarian dermoid, menorrhagia, anemia and fibroids.  Past Medical History:  Diagnosis Date  . Anemia    acute blood loss in High school  . Gestational diabetes   . Onychia and paronychia of toe   . PIH (pregnancy induced hypertension)    severe, required Mgs04  . Postpartum endometritis    Past Surgical History:  Procedure Laterality Date  . BREAST SURGERY    . CESAREAN SECTION  02-04-1999    vbac 1-06 and 05-19-2005  . CHOLECYSTECTOMY     Prior to Admission medications   Not on File   Allergies NKDA  Family History  Problem Relation Age of Onset  . Diabetes Mother   . Cancer Mother        breast and lung  . Hypertension Mother    Social History   Social History  . Marital status: Married    Spouse name: N/A  . Number of children: 3  . 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 Smoking status: Never Smoker  . Smokeless tobacco: Never Used  . Alcohol use No  . Drug use: No  . Sexual activity: Yes    Partners: Male    Birth control/ protection: IUD     Comment: MIRENA PLACED 05/2008 (PCP)   Other Topics Concern  . Not on file   Social History Narrative   Married to [place], had son [name] 11-00, [name], and skylar   Afebrile Weight 219  General alert and oriented Lung CTAB Car RRR Abdomen is soft and non tender  Pelvic Fibroids  IMPRESSION: Left ovarian dermoid Fibroids Menorrhagia Anemia  PLAN: LAVH LSO RS Risks reviewed Consent signed

## 2016-09-16 ENCOUNTER — Encounter (HOSPITAL_BASED_OUTPATIENT_CLINIC_OR_DEPARTMENT_OTHER): Payer: Self-pay | Admitting: *Deleted

## 2016-09-20 ENCOUNTER — Encounter (HOSPITAL_BASED_OUTPATIENT_CLINIC_OR_DEPARTMENT_OTHER): Payer: Self-pay | Admitting: *Deleted

## 2016-09-20 NOTE — Progress Notes (Signed)
NPO AFTER MN.  ARRIVE AT 0600.  NEEDS URINE PREG.  GETTING CBC AND T&S DONE Wednesday 09-22-2016.

## 2016-09-22 DIAGNOSIS — N72 Inflammatory disease of cervix uteri: Secondary | ICD-10-CM | POA: Diagnosis not present

## 2016-09-22 DIAGNOSIS — D251 Intramural leiomyoma of uterus: Secondary | ICD-10-CM | POA: Diagnosis not present

## 2016-09-22 DIAGNOSIS — N870 Mild cervical dysplasia: Secondary | ICD-10-CM | POA: Diagnosis not present

## 2016-09-22 DIAGNOSIS — D259 Leiomyoma of uterus, unspecified: Secondary | ICD-10-CM | POA: Diagnosis present

## 2016-09-22 LAB — CBC
HEMATOCRIT: 32.6 % — AB (ref 36.0–46.0)
HEMOGLOBIN: 10.7 g/dL — AB (ref 12.0–15.0)
MCH: 28 pg (ref 26.0–34.0)
MCHC: 32.8 g/dL (ref 30.0–36.0)
MCV: 85.3 fL (ref 78.0–100.0)
Platelets: 357 10*3/uL (ref 150–400)
RBC: 3.82 MIL/uL — ABNORMAL LOW (ref 3.87–5.11)
RDW: 15.8 % — ABNORMAL HIGH (ref 11.5–15.5)
WBC: 6.6 10*3/uL (ref 4.0–10.5)

## 2016-09-28 ENCOUNTER — Ambulatory Visit (HOSPITAL_BASED_OUTPATIENT_CLINIC_OR_DEPARTMENT_OTHER): Payer: BLUE CROSS/BLUE SHIELD | Admitting: Anesthesiology

## 2016-09-28 ENCOUNTER — Encounter (HOSPITAL_COMMUNITY)
Admission: RE | Disposition: A | Discharge status: Home / Self Care | Payer: Self-pay | Source: Ambulatory Visit | Attending: Obstetrics and Gynecology

## 2016-09-28 ENCOUNTER — Encounter (HOSPITAL_BASED_OUTPATIENT_CLINIC_OR_DEPARTMENT_OTHER): Payer: Self-pay | Admitting: *Deleted

## 2016-09-28 ENCOUNTER — Ambulatory Visit (HOSPITAL_BASED_OUTPATIENT_CLINIC_OR_DEPARTMENT_OTHER)
Admission: RE | Admit: 2016-09-28 | Discharge: 2016-09-29 | Disposition: A | Discharge status: Home / Self Care | Payer: BLUE CROSS/BLUE SHIELD | Source: Ambulatory Visit | Attending: Obstetrics and Gynecology | Admitting: Obstetrics and Gynecology

## 2016-09-28 DIAGNOSIS — D251 Intramural leiomyoma of uterus: Secondary | ICD-10-CM | POA: Insufficient documentation

## 2016-09-28 DIAGNOSIS — N72 Inflammatory disease of cervix uteri: Secondary | ICD-10-CM | POA: Insufficient documentation

## 2016-09-28 DIAGNOSIS — Z90710 Acquired absence of both cervix and uterus: Secondary | ICD-10-CM | POA: Diagnosis present

## 2016-09-28 DIAGNOSIS — N870 Mild cervical dysplasia: Secondary | ICD-10-CM | POA: Diagnosis not present

## 2016-09-28 HISTORY — DX: Leiomyoma of uterus, unspecified: D25.9

## 2016-09-28 HISTORY — DX: Iron deficiency anemia, unspecified: D50.9

## 2016-09-28 HISTORY — DX: Presence of spectacles and contact lenses: Z97.3

## 2016-09-28 HISTORY — DX: Excessive and frequent menstruation with regular cycle: N92.0

## 2016-09-28 HISTORY — DX: Essential (primary) hypertension: I10

## 2016-09-28 HISTORY — DX: Personal history of gestational diabetes: Z86.32

## 2016-09-28 HISTORY — DX: Benign neoplasm of left ovary: D27.1

## 2016-09-28 HISTORY — DX: Unspecified osteoarthritis, unspecified site: M19.90

## 2016-09-28 HISTORY — DX: Calculus of kidney: N20.0

## 2016-09-28 HISTORY — DX: Personal history of other infectious and parasitic diseases: Z86.19

## 2016-09-28 HISTORY — DX: Personal history of other complications of pregnancy, childbirth and the puerperium: Z87.59

## 2016-09-28 HISTORY — PX: LAPAROSCOPIC VAGINAL HYSTERECTOMY WITH SALPINGO OOPHORECTOMY: SHX6681

## 2016-09-28 HISTORY — DX: Personal history of other diseases of the digestive system: Z87.19

## 2016-09-28 HISTORY — DX: Hyperlipidemia, unspecified: E78.5

## 2016-09-28 LAB — TYPE AND SCREEN
ABO/RH(D): B POS
Antibody Screen: NEGATIVE

## 2016-09-28 LAB — POCT PREGNANCY, URINE: Preg Test, Ur: NEGATIVE

## 2016-09-28 LAB — ABO/RH: ABO/RH(D): B POS

## 2016-09-28 SURGERY — HYSTERECTOMY, VAGINAL, LAPAROSCOPY-ASSISTED, WITH SALPINGO-OOPHORECTOMY
Anesthesia: General | Laterality: Left

## 2016-09-28 MED ORDER — ONDANSETRON HCL 4 MG/2ML IJ SOLN
INTRAMUSCULAR | Status: DC | PRN
  Administered 2016-09-28: 4 mg via INTRAVENOUS

## 2016-09-28 MED ORDER — LIDOCAINE 2% (20 MG/ML) 5 ML SYRINGE
INTRAMUSCULAR | Status: DC | PRN
  Administered 2016-09-28: 100 mg via INTRAVENOUS

## 2016-09-28 MED ORDER — ONDANSETRON HCL 4 MG/2ML IJ SOLN
INTRAMUSCULAR | Status: AC
  Filled 2016-09-28: qty 2

## 2016-09-28 MED ORDER — HYDROMORPHONE 1 MG/ML IV SOLN
INTRAVENOUS | Status: DC
  Administered 2016-09-28: 4.6 mg via INTRAVENOUS
  Administered 2016-09-28: 12:00:00 via INTRAVENOUS
  Administered 2016-09-28: 1.4 mg via INTRAVENOUS
  Administered 2016-09-29: 0.4 mg via INTRAVENOUS
  Administered 2016-09-29: 1 mg via INTRAVENOUS
  Administered 2016-09-29: 0.8 mg via INTRAVENOUS
  Filled 2016-09-28 (×2): qty 25

## 2016-09-28 MED ORDER — FENTANYL CITRATE (PF) 250 MCG/5ML IJ SOLN
INTRAMUSCULAR | Status: AC
  Filled 2016-09-28: qty 5

## 2016-09-28 MED ORDER — LACTATED RINGERS IV SOLN
INTRAVENOUS | Status: DC
  Administered 2016-09-28 (×2): via INTRAVENOUS
  Filled 2016-09-28: qty 1000

## 2016-09-28 MED ORDER — DEXAMETHASONE SODIUM PHOSPHATE 10 MG/ML IJ SOLN
INTRAMUSCULAR | Status: AC
  Filled 2016-09-28: qty 1

## 2016-09-28 MED ORDER — KETOROLAC TROMETHAMINE 30 MG/ML IJ SOLN
30.0000 mg | Freq: Once | INTRAMUSCULAR | Status: AC
  Administered 2016-09-28: 30 mg via INTRAVENOUS
  Filled 2016-09-28: qty 1

## 2016-09-28 MED ORDER — SUGAMMADEX SODIUM 200 MG/2ML IV SOLN
INTRAVENOUS | Status: AC
  Filled 2016-09-28: qty 2

## 2016-09-28 MED ORDER — DEXTROSE 5 % IV SOLN
INTRAVENOUS | Status: AC
  Filled 2016-09-28: qty 50

## 2016-09-28 MED ORDER — CEFOTETAN DISODIUM-DEXTROSE 2-2.08 GM-% IV SOLR
INTRAVENOUS | Status: AC
  Filled 2016-09-28: qty 50

## 2016-09-28 MED ORDER — DIPHENHYDRAMINE HCL 50 MG/ML IJ SOLN: 12.5000 mg | Freq: Four times a day (QID) | INTRAMUSCULAR | Status: DC | PRN

## 2016-09-28 MED ORDER — DEXTROSE IN LACTATED RINGERS 5 % IV SOLN
INTRAVENOUS | Status: DC
  Administered 2016-09-28: 14:00:00 via INTRAVENOUS
  Administered 2016-09-28: 125 mL/h via INTRAVENOUS

## 2016-09-28 MED ORDER — LIDOCAINE 2% (20 MG/ML) 5 ML SYRINGE
INTRAMUSCULAR | Status: AC
  Filled 2016-09-28: qty 5

## 2016-09-28 MED ORDER — NALOXONE HCL 0.4 MG/ML IJ SOLN: 0.4000 mg | INTRAMUSCULAR | Status: DC | PRN

## 2016-09-28 MED ORDER — HYDROMORPHONE HCL 1 MG/ML IJ SOLN
0.2500 mg | INTRAMUSCULAR | Status: DC | PRN
  Filled 2016-09-28: qty 0.5

## 2016-09-28 MED ORDER — ESMOLOL HCL 100 MG/10ML IV SOLN
INTRAVENOUS | Status: AC
  Filled 2016-09-28: qty 10

## 2016-09-28 MED ORDER — SODIUM CHLORIDE 0.9% FLUSH: 9.0000 mL | INTRAVENOUS | Status: DC | PRN

## 2016-09-28 MED ORDER — DEXAMETHASONE SODIUM PHOSPHATE 4 MG/ML IJ SOLN
INTRAMUSCULAR | Status: DC | PRN
  Administered 2016-09-28: 10 mg via INTRAVENOUS

## 2016-09-28 MED ORDER — IBUPROFEN 200 MG PO TABS
600.0000 mg | ORAL_TABLET | Freq: Four times a day (QID) | ORAL | Status: DC | PRN
  Administered 2016-09-29: 600 mg via ORAL
  Filled 2016-09-28: qty 3

## 2016-09-28 MED ORDER — MIDAZOLAM HCL 2 MG/2ML IJ SOLN
INTRAMUSCULAR | Status: AC
  Filled 2016-09-28: qty 2

## 2016-09-28 MED ORDER — KETOROLAC TROMETHAMINE 30 MG/ML IJ SOLN
INTRAMUSCULAR | Status: AC
  Filled 2016-09-28: qty 1

## 2016-09-28 MED ORDER — KETOROLAC TROMETHAMINE 30 MG/ML IJ SOLN
INTRAMUSCULAR | Status: DC | PRN
  Administered 2016-09-28: 30 mg via INTRAVENOUS

## 2016-09-28 MED ORDER — LACTATED RINGERS IV SOLN
INTRAVENOUS | Status: DC
  Filled 2016-09-28: qty 1000

## 2016-09-28 MED ORDER — DEXTROSE 5 % IV SOLN
2.0000 g | INTRAVENOUS | Status: AC
  Administered 2016-09-28: 2 g via INTRAVENOUS
  Filled 2016-09-28: qty 2

## 2016-09-28 MED ORDER — TRAMADOL HCL 50 MG PO TABS: 50.0000 mg | ORAL_TABLET | Freq: Four times a day (QID) | ORAL | Status: DC | PRN

## 2016-09-28 MED ORDER — MENTHOL 3 MG MT LOZG: 1.0000 | LOZENGE | OROMUCOSAL | Status: DC | PRN

## 2016-09-28 MED ORDER — ROCURONIUM BROMIDE 50 MG/5ML IV SOSY
PREFILLED_SYRINGE | INTRAVENOUS | Status: DC | PRN
  Administered 2016-09-28 (×2): 10 mg via INTRAVENOUS
  Administered 2016-09-28: 50 mg via INTRAVENOUS
  Administered 2016-09-28: 20 mg via INTRAVENOUS

## 2016-09-28 MED ORDER — ESMOLOL HCL 100 MG/10ML IV SOLN
INTRAVENOUS | Status: DC | PRN
  Administered 2016-09-28: 10 mg via INTRAVENOUS

## 2016-09-28 MED ORDER — ROCURONIUM BROMIDE 50 MG/5ML IV SOSY
PREFILLED_SYRINGE | INTRAVENOUS | Status: AC
  Filled 2016-09-28: qty 5

## 2016-09-28 MED ORDER — MIDAZOLAM HCL 5 MG/5ML IJ SOLN
INTRAMUSCULAR | Status: DC | PRN
  Administered 2016-09-28: 2 mg via INTRAVENOUS

## 2016-09-28 MED ORDER — BUPIVACAINE HCL (PF) 0.25 % IJ SOLN
INTRAMUSCULAR | Status: DC | PRN
  Administered 2016-09-28: 5 mL

## 2016-09-28 MED ORDER — PROMETHAZINE HCL 25 MG/ML IJ SOLN
6.2500 mg | INTRAMUSCULAR | Status: DC | PRN
  Filled 2016-09-28: qty 1

## 2016-09-28 MED ORDER — FENTANYL CITRATE (PF) 100 MCG/2ML IJ SOLN
INTRAMUSCULAR | Status: DC | PRN
Start: 2016-09-28 — End: 2016-09-28
  Administered 2016-09-28 (×2): 25 ug via INTRAVENOUS
  Administered 2016-09-28: 50 ug via INTRAVENOUS
  Administered 2016-09-28: 25 ug via INTRAVENOUS
  Administered 2016-09-28: 50 ug via INTRAVENOUS
  Administered 2016-09-28 (×3): 25 ug via INTRAVENOUS

## 2016-09-28 MED ORDER — SUGAMMADEX SODIUM 200 MG/2ML IV SOLN
INTRAVENOUS | Status: DC | PRN
  Administered 2016-09-28: 200 mg via INTRAVENOUS

## 2016-09-28 MED ORDER — DIPHENHYDRAMINE HCL 12.5 MG/5ML PO ELIX: 12.5000 mg | ORAL_SOLUTION | Freq: Four times a day (QID) | ORAL | Status: DC | PRN

## 2016-09-28 MED ORDER — PROPOFOL 10 MG/ML IV BOLUS
INTRAVENOUS | Status: AC
  Filled 2016-09-28: qty 20

## 2016-09-28 MED ORDER — PROPOFOL 10 MG/ML IV BOLUS
INTRAVENOUS | Status: DC | PRN
  Administered 2016-09-28: 200 mg via INTRAVENOUS

## 2016-09-28 MED ORDER — ONDANSETRON HCL 4 MG/2ML IJ SOLN
4.0000 mg | Freq: Four times a day (QID) | INTRAMUSCULAR | Status: DC | PRN
  Administered 2016-09-28: 4 mg via INTRAVENOUS
  Filled 2016-09-28: qty 2

## 2016-09-28 SURGICAL SUPPLY — 58 items
ADH SKN CLS APL DERMABOND .7 (GAUZE/BANDAGES/DRESSINGS) ×2
BARRIER ADHS 3X4 INTERCEED (GAUZE/BANDAGES/DRESSINGS) IMPLANT
BLADE CLIPPER SURG (BLADE) IMPLANT
BRR ADH 4X3 ABS CNTRL BYND (GAUZE/BANDAGES/DRESSINGS)
CANISTER SUCT 3000ML PPV (MISCELLANEOUS) IMPLANT
CLOTH BEACON ORANGE TIMEOUT ST (SAFETY) ×3 IMPLANT
COVER BACK TABLE 60X90IN (DRAPES) ×3 IMPLANT
COVER MAYO STAND STRL (DRAPES) ×6 IMPLANT
DERMABOND ADVANCED (GAUZE/BANDAGES/DRESSINGS) ×4
DERMABOND ADVANCED .7 DNX12 (GAUZE/BANDAGES/DRESSINGS) ×1 IMPLANT
DRSG COVADERM PLUS 2X2 (GAUZE/BANDAGES/DRESSINGS) ×4 IMPLANT
DRSG OPSITE POSTOP 3X4 (GAUZE/BANDAGES/DRESSINGS) ×3 IMPLANT
DURAPREP 26ML APPLICATOR (WOUND CARE) ×3 IMPLANT
ELECT REM PT RETURN 9FT ADLT (ELECTROSURGICAL) ×3
ELECTRODE REM PT RTRN 9FT ADLT (ELECTROSURGICAL) ×1 IMPLANT
FILTER SMOKE EVAC LAPAROSHD (FILTER) IMPLANT
GLOVE BIO SURGEON STRL SZ 6.5 (GLOVE) ×6 IMPLANT
GLOVE BIO SURGEONS STRL SZ 6.5 (GLOVE) ×3
GLOVE ECLIPSE 6.5 STRL STRAW (GLOVE) ×3 IMPLANT
GLOVE LITE  25/BX (GLOVE) ×2 IMPLANT
HOLDER FOLEY CATH W/STRAP (MISCELLANEOUS) ×3 IMPLANT
KIT RM TURNOVER CYSTO AR (KITS) ×3 IMPLANT
LEGGING LITHOTOMY PAIR STRL (DRAPES) ×3 IMPLANT
NDL INSUFFLATION 14GA 120MM (NEEDLE) ×1 IMPLANT
NDL INSUFFLATION 14GA 150MM (NEEDLE) IMPLANT
NEEDLE INSUFFLATION 14GA 120MM (NEEDLE) ×3 IMPLANT
NEEDLE INSUFFLATION 14GA 150MM (NEEDLE) IMPLANT
NS IRRIG 500ML POUR BTL (IV SOLUTION) ×3 IMPLANT
PACK LAVH (CUSTOM PROCEDURE TRAY) ×3 IMPLANT
PACK ROBOTIC GOWN (GOWN DISPOSABLE) ×2 IMPLANT
PACK TRENDGUARD 450 HYBRID PRO (MISCELLANEOUS) IMPLANT
PACK TRENDGUARD 600 HYBRD PROC (MISCELLANEOUS) IMPLANT
PAD OB MATERNITY 4.3X12.25 (PERSONAL CARE ITEMS) ×3 IMPLANT
PAD PREP 24X48 CUFFED NSTRL (MISCELLANEOUS) ×3 IMPLANT
SCISSORS LAP 5X35 DISP (ENDOMECHANICALS) IMPLANT
SEALER TISSUE G2 CVD JAW 45CM (ENDOMECHANICALS) ×3 IMPLANT
SET IRRIG TUBING LAPAROSCOPIC (IRRIGATION / IRRIGATOR) ×3 IMPLANT
SUT VIC AB 0 CT1 18XCR BRD8 (SUTURE) ×2 IMPLANT
SUT VIC AB 0 CT1 27 (SUTURE) ×3
SUT VIC AB 0 CT1 27XCR 8 STRN (SUTURE) IMPLANT
SUT VIC AB 0 CT1 36 (SUTURE) ×6 IMPLANT
SUT VIC AB 0 CT1 8-18 (SUTURE) ×6
SUT VIC AB 3-0 PS2 18 (SUTURE) ×3
SUT VIC AB 3-0 PS2 18XBRD (SUTURE) ×1 IMPLANT
SUT VIC AB 3-0 SH 27 (SUTURE)
SUT VIC AB 3-0 SH 27X BRD (SUTURE) IMPLANT
SUT VICRYL 0 TIES 12 18 (SUTURE) ×3 IMPLANT
SUT VICRYL 0 UR6 27IN ABS (SUTURE) ×3 IMPLANT
SYR BULB IRRIGATION 50ML (SYRINGE) IMPLANT
TOWEL OR 17X24 6PK STRL BLUE (TOWEL DISPOSABLE) ×6 IMPLANT
TRAY FOLEY CATH SILVER 14FR (SET/KITS/TRAYS/PACK) ×3 IMPLANT
TRENDGUARD 450 HYBRID PRO PACK (MISCELLANEOUS) ×3
TRENDGUARD 600 HYBRID PROC PK (MISCELLANEOUS)
TROCAR OPTI TIP 5M 100M (ENDOMECHANICALS) ×3 IMPLANT
TROCAR XCEL DIL TIP R 11M (ENDOMECHANICALS) ×3 IMPLANT
TUBING INSUF HEATED (TUBING) ×3 IMPLANT
TUBING SUCTION 1/4X6FT (MISCELLANEOUS) ×3 IMPLANT
WATER STERILE IRR 500ML POUR (IV SOLUTION) ×3 IMPLANT

## 2016-09-28 NOTE — Brief Op Note (Signed)
09/28/2016  9:21 AM  PATIENT:  Barbara [name]  46 y.o. female  PRE-OPERATIVE DIAGNOSIS:  Fibroids Menorrhagia Left ovarian dermoid  POST-OPERATIVE DIAGNOSIS:  Same  PROCEDURE:  Procedure(s) with comments: LAPAROSCOPIC ASSISTED VAGINAL HYSTERECTOMY WITH SALPINGO OOPHORECTOMY, right salpingectomy (Left) - NEED BED FOR OVERNIGHT STAY  SURGEON:  Surgeon(s) and Role:    * Dian Queen, MD - Primary    * Louretta Shorten, MD - Assisting  PHYSICIAN ASSISTANT:   ASSISTANTS: none   ANESTHESIA:   general  EBL:  Total I/O In: 1000 [I.V.:1000] Out: 750 [Urine:500; Blood:250]  BLOOD ADMINISTERED:none  DRAINS: Urinary Catheter (Foley)   LOCAL MEDICATIONS USED:  LIDOCAINE   SPECIMEN:  Source of Specimen:  uterus, cervix, right tube and left tube and ovary  DISPOSITION OF SPECIMEN:  PATHOLOGY  COUNTS:  YES  TOURNIQUET:  * No tourniquets in log *  DICTATION: .Other Dictation: Dictation Number [id number]  PLAN OF CARE: Admit for overnight observation  PATIENT DISPOSITION:  PACU - hemodynamically stable.   Delay start of Pharmacological VTE agent (>24hrs) due to surgical blood loss or risk of bleeding: not applicable

## 2016-09-28 NOTE — Op Note (Signed)
NAMEBEUNA, BOLDING           ACCOUNT NO.:  [id number]  MEDICAL RECORD NO.:  [id number]  LOCATION:                                 FACILITY:  PHYSICIAN:  Delaynie Stetzer L. Helane Rima, M.D.    DATE OF BIRTH:  DATE OF PROCEDURE:  09/28/2016 DATE OF DISCHARGE:                              OPERATIVE REPORT   PREOPERATIVE DIAGNOSES:  Fibroids and menorrhagia and left ovarian dermoid.  POSTOPERATIVE DIAGNOSES:  Fibroids and menorrhagia and left ovarian dermoid.  PROCEDURE:  Laparoscopic assisted vaginal hysterectomy, left salpingo- oophorectomy, and right salpingectomy.  SURGEON:  Ryleigh Buenger L. Helane Rima, M.D.  ASSISTANT:  Monia Sabal. Corinna Capra, M.D.  ESTIMATED BLOOD LOSS:  250 mL.  COMPLICATIONS:  None.  PATHOLOGY:  Uterus, cervix, right tube and left tube and ovary.  DRAINS:  Foley catheter.  DESCRIPTION OF PROCEDURE:  The patient was taken to the operating room. She was intubated and positioned accordingly by Anesthesia.  She is prepped and draped, and a uterine manipulator was inserted and a Foley catheter was inserted.  Attention was turned to the abdomen where a small infraumbilical incision was made.  The Veress needle was inserted. Pneumoperitoneum was performed.  The Veress needle was removed and an 11 mm trocar was inserted and then a 5 mm trocar was inserted.  We then inspected the pelvis.  The uterus was enlarged and myomatous.  The right ovary was normal.  Left ovary was enlarged consistent with a dermoid and also appeared to be slightly twisted on the pedicle.  Then, using the EnSeal, I placed it across the mesosalpinx on the right, freed the tube up, and then used electrocautery and cut with the EnSeal.  The triple pedicle down to the round ligament on the right with very good hemostasis.  On the patient's left side, I then identified the ureter which was deep in the pelvis, placed the EnSeal across the left infundibulopelvic ligament just beneath the ovary, transected that  with hemostasis and carried that down to the round ligament.  We then released the pneumoperitoneum, went down to the vagina and made a circumferential incision in standard fashion around the cervix and the posterior and then the anterior cul-de-sacs with Mayo scissors.  The uterosacral and cardinal ligaments were then clamped and it stayed very snugged beside the cervix in the lower part of the uterus.  Each pedicle clamped, cut, and suture ligated using 0 Vicryl suture.  I then walked my way up the broad ligament.  Each pedicle was clamped, cut, and suture ligated using 0 Vicryl suture.  We then removed the uterus and identified the specimens; cervix, uterus, right fallopian tube, left fallopian tube, and the left ovary.  I then placed my angle stitches of the vaginal cuff in standard fashion using 0 Vicryl stitch.  Closed the posterior cuff in a running locked stitch using 0 Vicryl and then closed the cuff completely anterior to posterior in a running lock stitch using 0 Vicryl suture.  Hemostasis was very good.  I went back up to the abdomen, replaced the pneumoperitoneum, irrigated the pelvis and put the patient in Trendelenburg position for just a few minutes.  There was no bleeding at all.  I did  observe under low pressure.  I then placed Arista across the pelvis and sidewall for additional hemostasis postoperatively.  I then observed after the Arista under low pressure and again did not see any bleeding.  The pneumoperitoneum was released. The trocars were removed.  The stitches were closed with 3-0 Vicryl interrupted and bandage was applied to each incision site which was two. Urine output was approximately 500+ mL of clear urine.  EBL was approximately 250 mL.  The patient remained hemodynamically stable.  All sponge, lap, and instrument counts were correct x2.  The patient was extubated, and went to recovery room in stable condition.     Jarmarcus Wambold L. Helane Rima,  M.D.     Nevin Bloodgood  D:  09/28/2016  T:  09/28/2016  Job:  [id number]

## 2016-09-28 NOTE — Anesthesia Procedure Notes (Signed)
Procedure Name: Intubation Date/Time: 09/28/2016 7:33 AM Performed by: Denna Haggard D Pre-anesthesia Checklist: Patient identified, Emergency Drugs available, Suction available and Patient being monitored Patient Re-evaluated:Patient Re-evaluated prior to inductionOxygen Delivery Method: Circle system utilized Preoxygenation: Pre-oxygenation with 100% oxygen Intubation Type: IV induction Ventilation: Mask ventilation without difficulty Laryngoscope Size: Mac and 3 Grade View: Grade I Tube type: Oral Tube size: 7.0 mm Number of attempts: 1 Airway Equipment and Method: Stylet and Oral airway Placement Confirmation: ETT inserted through vocal cords under direct vision,  positive ETCO2 and breath sounds checked- equal and bilateral Secured at: 23 cm Tube secured with: Tape Dental Injury: Teeth and Oropharynx as per pre-operative assessment

## 2016-09-28 NOTE — Anesthesia Preprocedure Evaluation (Addendum)
Anesthesia Evaluation  Patient identified by MRN, date of birth, ID band Patient awake    Reviewed: Allergy & Precautions, NPO status , Patient's Chart, lab work & pertinent test results  Airway Mallampati: I   Neck ROM: Full    Dental no notable dental hx.    Pulmonary neg pulmonary ROS,    breath sounds clear to auscultation       Cardiovascular hypertension,  Rhythm:Regular Rate:Normal     Neuro/Psych  Headaches,    GI/Hepatic negative GI ROS, Neg liver ROS,   Endo/Other  negative endocrine ROSMorbid obesity  Renal/GU      Musculoskeletal  (+) Arthritis ,   Abdominal (+) + obese,   Peds  Hematology  (+) anemia ,   Anesthesia Other Findings   Reproductive/Obstetrics                            Anesthesia Physical Anesthesia Plan  ASA: II  Anesthesia Plan: General   Post-op Pain Management:    Induction: Intravenous  PONV Risk Score and Plan: 4 or greater and Ondansetron, Dexamethasone, Propofol, Midazolam, Scopolamine patch - Pre-op and Treatment may vary due to age or medical condition  Airway Management Planned: Oral ETT  Additional Equipment:   Intra-op Plan:   Post-operative Plan: Extubation in OR  Informed Consent: I have reviewed the patients History and Physical, chart, labs and discussed the procedure including the risks, benefits and alternatives for the proposed anesthesia with the patient or authorized representative who has indicated his/her understanding and acceptance.     Plan Discussed with:   Anesthesia Plan Comments:         Anesthesia Quick Evaluation

## 2016-09-28 NOTE — Progress Notes (Signed)
Patient doing well.  No complaints  Pain is minimal  She wants to eat  BP (!) 154/82 (BP Location: Right Arm)   Pulse 75   Temp 98.5 F (36.9 C) (Oral)   Resp 20   Ht 5\' 9"  (1.753 m)   Wt 98.9 kg (218 lb)   LMP 09/16/2016 (Approximate)   SpO2 100%   BMI 32.19 kg/m   Urine output is great  Abdomen is soft and benign  Routine care Remove foley in am Advance diet

## 2016-09-28 NOTE — Progress Notes (Signed)
Patient doing well.  H and P on the chart  Will proceed with  LAVH, BS LSO  Possible laparotomy ( previous surgical history)  Consent signed

## 2016-09-28 NOTE — Transfer of Care (Signed)
Immediate Anesthesia Transfer of Care Note  Patient: Barbara [name]  Procedure(s) Performed: Procedure(s) (LRB): LAPAROSCOPIC ASSISTED VAGINAL HYSTERECTOMY WITH SALPINGO OOPHORECTOMY, right salpingectomy (Left)  Patient Location: PACU  Anesthesia Type: General  Level of Consciousness: awake, oriented, sedated and patient cooperative  Airway & Oxygen Therapy: Patient Spontanous Breathing and Patient connected to face mask oxygen  Post-op Assessment: Report given to PACU RN and Post -op Vital signs reviewed and stable  Post vital signs: Reviewed and stable  Complications: No apparent anesthesia complications  Last Vitals:  Vitals:   09/28/16 0607 09/28/16 0930  BP: (!) 150/91 (!) (P) 118/114  Pulse: 82   Resp: 18 (P) 20  Temp: 37.1 C (P) 37 C    Last Pain:  Vitals:   09/28/16 0607  TempSrc: Oral      \

## 2016-09-28 NOTE — Anesthesia Postprocedure Evaluation (Signed)
Anesthesia Post Note  Patient: Barbara [name]  Procedure(s) Performed: Procedure(s) (LRB): LAPAROSCOPIC ASSISTED VAGINAL HYSTERECTOMY WITH SALPINGO OOPHORECTOMY, right salpingectomy (Left)     Patient location during evaluation: PACU Anesthesia Type: General Level of consciousness: awake, oriented and sedated Pain management: pain level controlled Vital Signs Assessment: post-procedure vital signs reviewed and stable Respiratory status: spontaneous breathing, nonlabored ventilation, respiratory function stable and patient connected to nasal cannula oxygen Cardiovascular status: stable (BP mildly elevated ) Postop Assessment: no signs of nausea or vomiting Anesthetic complications: no    Last Vitals:  Vitals:   09/28/16 1135 09/28/16 1207  BP:  (!) 167/89  Pulse:  70  Resp: (!) 21   Temp:  37.2 C    Last Pain:  Vitals:   09/28/16 1207  TempSrc: Oral  PainSc:                  [name],JAMES TERRILL

## 2016-09-29 ENCOUNTER — Encounter (HOSPITAL_BASED_OUTPATIENT_CLINIC_OR_DEPARTMENT_OTHER): Payer: Self-pay | Admitting: Obstetrics and Gynecology

## 2016-09-29 DIAGNOSIS — N870 Mild cervical dysplasia: Secondary | ICD-10-CM | POA: Diagnosis not present

## 2016-09-29 LAB — CBC
HCT: 28 % — ABNORMAL LOW (ref 36.0–46.0)
Hemoglobin: 9.4 g/dL — ABNORMAL LOW (ref 12.0–15.0)
MCH: 28.5 pg (ref 26.0–34.0)
MCHC: 33.6 g/dL (ref 30.0–36.0)
MCV: 84.8 fL (ref 78.0–100.0)
PLATELETS: 305 10*3/uL (ref 150–400)
RBC: 3.3 MIL/uL — ABNORMAL LOW (ref 3.87–5.11)
RDW: 16.1 % — AB (ref 11.5–15.5)
WBC: 9.7 10*3/uL (ref 4.0–10.5)

## 2016-09-29 NOTE — Discharge Summary (Signed)
Admission Diagnosis: Fibroids Menorrhagia Anemia - iron deficiency Left ovarian Dermoid  Discharge Diagnosis: Same  Hospital Course: 46 year old female admitted for LAVH, Right salpingectomy and LSO She did very well postop and went home in excellent condition on POD #1 By POD #1 she was voiding, ambulating, and tolerating regular diet.  Pain managed only on Ibuprofen  BP 135/80 (BP Location: Right Arm)   Pulse 67   Temp 98 F (36.7 C) (Oral)   Resp 17   Ht 5\' 9"  (1.753 m)   Wt 98.9 kg (218 lb)   LMP 09/16/2016 (Approximate)   SpO2 97%   BMI 32.19 kg/m  Abdomen is soft and non tender  Results for orders placed or performed during the hospital encounter of 09/28/16 (from the past 24 hour(s))  CBC     Status: Abnormal   Collection Time: 09/29/16  5:48 AM  Result Value Ref Range   WBC 9.7 4.0 - 10.5 K/uL   RBC 3.30 (L) 3.87 - 5.11 MIL/uL   Hemoglobin 9.4 (L) 12.0 - 15.0 g/dL   HCT 28.0 (L) 36.0 - 46.0 %   MCV 84.8 78.0 - 100.0 fL   MCH 28.5 26.0 - 34.0 pg   MCHC 33.6 30.0 - 36.0 g/dL   RDW 16.1 (H) 11.5 - 15.5 %   Platelets 305 150 - 400 K/uL   Patient discharged home in good condition Patient to take OTC Ibuprofen at home Follow up in 1 week

## 2016-09-29 NOTE — Progress Notes (Signed)
Pt tolerating her diet with no complaints of pain and only mild pain.  D/C instructions were given.

## 2017-02-21 ENCOUNTER — Other Ambulatory Visit (INDEPENDENT_AMBULATORY_CARE_PROVIDER_SITE_OTHER): Payer: BLUE CROSS/BLUE SHIELD

## 2017-02-21 DIAGNOSIS — Z0000 Encounter for general adult medical examination without abnormal findings: Secondary | ICD-10-CM | POA: Diagnosis not present

## 2017-02-21 LAB — COMPREHENSIVE METABOLIC PANEL
ALBUMIN: 4.1 g/dL (ref 3.5–5.2)
ALT: 16 U/L (ref 0–35)
AST: 16 U/L (ref 0–37)
Alkaline Phosphatase: 69 U/L (ref 39–117)
BUN: 10 mg/dL (ref 6–23)
CALCIUM: 9.2 mg/dL (ref 8.4–10.5)
CHLORIDE: 104 meq/L (ref 96–112)
CO2: 29 meq/L (ref 19–32)
CREATININE: 0.82 mg/dL (ref 0.40–1.20)
GFR: 97.14 mL/min (ref >60.00)
GLUCOSE: 122 mg/dL — AB (ref 70–99)
POTASSIUM: 4.4 meq/L (ref 3.5–5.1)
Sodium: 138 mEq/L (ref 135–145)
Total Bilirubin: 0.4 mg/dL (ref 0.2–1.2)
Total Protein: 7.4 g/dL (ref 6.0–8.3)

## 2017-02-21 LAB — LIPID PANEL
CHOL/HDL RATIO: 3
CHOLESTEROL: 214 mg/dL — AB (ref 0–200)
HDL: 66.9 mg/dL (ref >39.00)
LDL CALC: 138 mg/dL — AB (ref 0–99)
NonHDL: 147.35
TRIGLYCERIDES: 48 mg/dL (ref 0.0–149.0)
VLDL: 9.6 mg/dL (ref 0.0–40.0)

## 2017-02-21 LAB — CBC WITH DIFFERENTIAL/PLATELET
BASOS ABS: 0.1 10*3/uL (ref 0.0–0.1)
BASOS PCT: 1.3 % (ref 0.0–3.0)
EOS ABS: 0.1 10*3/uL (ref 0.0–0.7)
Eosinophils Relative: 1.3 % (ref 0.0–5.0)
HEMATOCRIT: 36.3 % (ref 36.0–46.0)
HEMOGLOBIN: 12 g/dL (ref 12.0–15.0)
LYMPHS PCT: 35.3 % (ref 12.0–46.0)
Lymphs Abs: 1.7 10*3/uL (ref 0.7–4.0)
MCHC: 33.1 g/dL (ref 30.0–36.0)
MCV: 89 fl (ref 78.0–100.0)
Monocytes Absolute: 0.3 10*3/uL (ref 0.1–1.0)
Monocytes Relative: 6.4 % (ref 3.0–12.0)
Neutro Abs: 2.7 10*3/uL (ref 1.4–7.7)
Neutrophils Relative %: 55.7 % (ref 43.0–77.0)
Platelets: 381 10*3/uL (ref 150.0–400.0)
RBC: 4.08 Mil/uL (ref 3.87–5.11)
RDW: 15.7 % — ABNORMAL HIGH (ref 11.5–15.5)
WBC: 4.8 10*3/uL (ref 4.0–10.5)

## 2017-02-21 LAB — TSH: TSH: 1.55 u[IU]/mL (ref 0.35–4.50)

## 2017-02-23 ENCOUNTER — Ambulatory Visit (INDEPENDENT_AMBULATORY_CARE_PROVIDER_SITE_OTHER): Payer: BLUE CROSS/BLUE SHIELD | Admitting: Family Medicine

## 2017-02-23 ENCOUNTER — Encounter: Payer: Self-pay | Admitting: Family Medicine

## 2017-02-23 VITALS — BP 128/84 | HR 83 | Temp 98.8°F | Ht 69.0 in | Wt 226.0 lb

## 2017-02-23 DIAGNOSIS — N393 Stress incontinence (female) (male): Secondary | ICD-10-CM

## 2017-02-23 DIAGNOSIS — Z01419 Encounter for gynecological examination (general) (routine) without abnormal findings: Secondary | ICD-10-CM

## 2017-02-23 MED ORDER — TOLTERODINE TARTRATE 1 MG PO TABS: 1.0000 mg | ORAL_TABLET | Freq: Two times a day (BID) | ORAL | 0 refills | Status: DC

## 2017-02-23 NOTE — Patient Instructions (Signed)
Great to see you. Happy Thanksgiving! Please give my love to your family.

## 2017-02-23 NOTE — Progress Notes (Signed)
46 yo very pleasant female here for CPX.  Health Maintenance  Topic Date Due  . TETANUS/TDAP  05/13/2015  . INFLUENZA VACCINE  07/04/2019 (Originally 11/03/2016)  . PAP SMEAR  01/15/2019  . HIV Screening  Completed    G3P3 Last pap smear 01/15/16 (done by me), s/p hysterectomy in 09/2016.  Doing well.  Likes her new job. Having more urinary incontinence again.  Wants to restart Detrol.  Lab Results  Component Value Date   WBC 4.8 02/21/2017   HGB 12.0 02/21/2017   HCT 36.3 02/21/2017   MCV 89.0 02/21/2017   PLT 381.0 02/21/2017    Mammogram 01/08/16 No family h/o breast CA.  She did have benign breast mass removed when she was a teenager.  Lab Results  Component Value Date   CHOL 214 (H) 02/21/2017   HDL 66.90 02/21/2017   LDLCALC 138 (H) 02/21/2017   LDLDIRECT 145.1 10/26/2012   TRIG 48.0 02/21/2017   CHOLHDL 3 02/21/2017    Wt Readings from Last 3 Encounters:  02/23/17 226 lb (102.5 kg)  09/28/16 218 lb (98.9 kg)  06/07/16 217 lb (98.4 kg)    Lab Results  Component Value Date   HGBA1C 6.2 01/10/2015   Lab Results  Component Value Date   NA 138 02/21/2017   K 4.4 02/21/2017   CL 104 02/21/2017   CO2 29 02/21/2017   Lab Results  Component Value Date   WBC 4.8 02/21/2017   HGB 12.0 02/21/2017   HCT 36.3 02/21/2017   MCV 89.0 02/21/2017   PLT 381.0 02/21/2017   Lab Results  Component Value Date   TSH 1.55 02/21/2017     Patient Active Problem List   Diagnosis Date Noted  . Well woman exam 02/23/2017  . S/P laparoscopic assisted vaginal hysterectomy (LAVH) 09/28/2016  . Insomnia 02/18/2014  . Stress incontinence in female 01/10/2014  . History of gestational diabetes 10/26/2012  . HLD (hyperlipidemia) 10/23/2009  . HYPERTENSION, BENIGN SYSTEMIC 06/02/2006   Past Medical History:  Diagnosis Date  . Arthritis   . Cyst, ovary, dermoid, left   . History of gestational diabetes   . History of Helicobacter pylori infection 09/2005  . History of  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 07/ 2017  . History of pregnancy induced hypertension   . Hyperlipidemia   . Hypertension    during pregnancy-no medications  . Iron deficiency anemia   . Menorrhagia   . Nephrolithiasis    bilateral tiny nonobstructive calculi per CT 06-21-2016  . Uterine fibroid   . Wears glasses    Past Surgical History:  Procedure Laterality Date  . 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 CESAREAN SECTION  02-04-1999    vbac 1-06 and 05-19-2005  . ERCP W/ SPHINCTEROTOMY AND BALLOON DILATION  10/11/2005  . LAPAROSCOPIC CHOLECYSTECTOMY  10/04/2005   w/ ERCP  . LAPAROSCOPIC VAGINAL HYSTERECTOMY WITH SALPINGO OOPHORECTOMY Left 09/28/2016   Procedure: LAPAROSCOPIC ASSISTED VAGINAL HYSTERECTOMY WITH SALPINGO OOPHORECTOMY, right salpingectomy;  Surgeon: Dian Queen, MD;  Location: [place];  Service: Gynecology;  Laterality: Left;  NEED BED FOR OVERNIGHT STAY   Social History   Tobacco Use  . Smoking status: Never Smoker  . Smokeless tobacco: Never Used  Substance Use Topics  . Alcohol use: No    Alcohol/week: 0.0 oz  . Drug use: No   Family History  Problem Relation Age of Onset  . Diabetes Mother   . Cancer Mother  breast and lung  . Hypertension Mother    No Known Allergies Current Outpatient Medications on File Prior to Visit  Medication Sig Dispense Refill  . Ferrous Sulfate (IRON) 325 (65 Fe) MG TABS Take 1 tablet by mouth daily.     No current facility-administered medications on file prior to visit.    The PMH, PSH, Social History, Family History, Medications, and allergies have been reviewed in [facility], and have been updated if relevant.   Review of Systems  Constitutional: Negative.   Eyes: Negative.   Respiratory: Negative.   Cardiovascular: Negative.   Gastrointestinal: Negative.   Endocrine: Negative.   Genitourinary: Negative for difficulty urinating, dyspareunia, dysuria and enuresis.        +urinary incontinence  Musculoskeletal: Negative.   Skin: Negative.   Allergic/Immunologic: Negative.   Neurological: Negative.   Hematological: Negative.   Psychiatric/Behavioral: Negative.   All other systems reviewed and are negative.   Physical Exam BP 128/84 (BP Location: Left Arm, Patient Position: Sitting, Cuff Size: Normal)   Pulse 83   Temp 98.8 F (37.1 C) (Oral)   Ht 5\' 9"  (1.753 m)   Wt 226 lb (102.5 kg)   LMP 09/16/2016 (Approximate)   SpO2 100%   BMI 33.37 kg/m  Wt Readings from Last 3 Encounters:  02/23/17 226 lb (102.5 kg)  09/28/16 218 lb (98.9 kg)  06/07/16 217 lb (98.4 kg)   General:  Well-developed,well-nourished,in no acute distress; alert,appropriate and cooperative throughout examination Head:  normocephalic and atraumatic.   Eyes:  vision grossly intact, pupils equal, pupils round, and pupils reactive to light.   Ears:  R ear normal and L ear normal.   Nose:  no external deformity.   Mouth:  good dentition.   Neck:  No deformities, masses, or tenderness noted. Breasts:  No mass, nodules, thickening, tenderness, bulging, retraction, inflamation, nipple discharge or skin changes noted. Lungs:  Normal respiratory effort, chest expands symmetrically. Lungs are clear to auscultation, no crackles or wheezes. Heart:  Normal rate and regular rhythm. S1 and S2 normal without gallop, murmur, click, rub or other extra sounds. Abdomen:  Bowel sounds positive,abdomen soft and non-tender without masses, organomegaly or hernias noted. Rectal:  no external abnormalities.   Genitalia:  Pelvic Exam:        External: normal female genitalia without lesions or masses        Vagina: normal without lesions or masses        Cervix: absent        Adnexa: normal bimanual exam without masses or fullness        Uterus: absent Msk:  No deformity or scoliosis noted of thoracic or lumbar spine.   Extremities:  No clubbing, cyanosis, edema, or deformity noted with normal full  range of motion of all joints.   Neurologic:  alert & oriented X3 and gait normal.   Skin:  Intact without suspicious lesions or rashes Cervical Nodes:  No lymphadenopathy noted Axillary Nodes:  No palpable lymphadenopathy Psych:  Cognition and judgment appear intact. Alert and cooperative with normal attention span and concentration. No apparent delusions, illusions, hallucinations

## 2017-02-23 NOTE — Assessment & Plan Note (Signed)
Reviewed preventive care protocols, scheduled due services, and updated immunizations Discussed nutrition, exercise, diet, and healthy lifestyle.  

## 2017-02-23 NOTE — Assessment & Plan Note (Signed)
Deteriorated. eRx Detrol refill sent.

## 2017-03-08 DIAGNOSIS — F411 Generalized anxiety disorder: Secondary | ICD-10-CM | POA: Diagnosis not present

## 2017-03-15 DIAGNOSIS — F411 Generalized anxiety disorder: Secondary | ICD-10-CM | POA: Diagnosis not present

## 2017-03-16 DIAGNOSIS — F411 Generalized anxiety disorder: Secondary | ICD-10-CM | POA: Diagnosis not present

## 2017-03-23 DIAGNOSIS — F411 Generalized anxiety disorder: Secondary | ICD-10-CM | POA: Diagnosis not present

## 2017-03-24 DIAGNOSIS — F411 Generalized anxiety disorder: Secondary | ICD-10-CM | POA: Diagnosis not present

## 2017-04-14 ENCOUNTER — Ambulatory Visit (INDEPENDENT_AMBULATORY_CARE_PROVIDER_SITE_OTHER): Payer: BLUE CROSS/BLUE SHIELD | Admitting: Family Medicine

## 2017-04-14 ENCOUNTER — Encounter: Payer: Self-pay | Admitting: Family Medicine

## 2017-04-14 VITALS — BP 154/98 | HR 86 | Temp 99.0°F | Ht 69.0 in | Wt 229.2 lb

## 2017-04-14 DIAGNOSIS — R519 Headache, unspecified: Secondary | ICD-10-CM | POA: Insufficient documentation

## 2017-04-14 DIAGNOSIS — Z23 Encounter for immunization: Secondary | ICD-10-CM | POA: Diagnosis not present

## 2017-04-14 DIAGNOSIS — R51 Headache: Secondary | ICD-10-CM

## 2017-04-14 NOTE — Progress Notes (Signed)
Subjective:   Patient ID: Barbara [name], female    DOB: [birth year], 47 y.o.   MRN: [id number]  Barbara [name] is a pleasant 47 y.o. year old female who presents to clinic today with Follow-up (Patient is here today to F/U with headaches.  She states that she is still having the headaches and wants to get to the root of why she is having them so frequently in the first place.)  on 04/14/2017  HPI:  Headaches- increasing in frequency.  Kept a headache journal and brings this in today. Usually bilateral frontal.  Sometimes wakes up with them but never wakes her up.  Never associated with nausea, vomiting or sensitivity to light.   Alleve does help but she tries not to take it. Under more stress lately. Not exercising or drinking as much water.  Current Outpatient Medications on File Prior to Visit  Medication Sig Dispense Refill  . Ferrous Sulfate (IRON) 325 (65 Fe) MG TABS Take 1 tablet by mouth daily.    Marland Kitchen tolterodine (DETROL) 1 MG tablet Take 1 tablet (1 mg total) by mouth 2 (two) times daily. 60 tablet 0   No current facility-administered medications on file prior to visit.     No Known Allergies  Past Medical History:  Diagnosis Date  . Arthritis   . Cyst, ovary, dermoid, left   . History of gestational diabetes   . History of Helicobacter pylori infection 09/2005  . 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 History of pregnancy induced hypertension   . Hyperlipidemia   . Hypertension    during pregnancy-no medications  . Iron deficiency anemia   . Menorrhagia   . Nephrolithiasis    bilateral tiny nonobstructive calculi per CT 06-21-2016  . Uterine fibroid   . Wears glasses     Past Surgical History:  Procedure Laterality Date  . 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 CESAREAN SECTION  02-04-1999    vbac 1-06 and 05-19-2005  . ERCP W/ SPHINCTEROTOMY AND BALLOON DILATION  10/11/2005  . LAPAROSCOPIC CHOLECYSTECTOMY   10/04/2005   w/ ERCP  . LAPAROSCOPIC VAGINAL HYSTERECTOMY WITH SALPINGO OOPHORECTOMY Left 09/28/2016   Procedure: LAPAROSCOPIC ASSISTED VAGINAL HYSTERECTOMY WITH SALPINGO OOPHORECTOMY, right salpingectomy;  Surgeon: Dian Queen, MD;  Location: [place];  Service: Gynecology;  Laterality: Left;  NEED BED FOR OVERNIGHT STAY    Family History  Problem Relation Age of Onset  . Diabetes Mother   . Cancer Mother        breast and lung  . Hypertension Mother     Social History   Socioeconomic History  . Marital status: Married    Spouse name: Not on file  . Number of children: 3  . Years of education: Not on file  . Highest education level: Not on file  Social Needs  . Financial resource strain: Not on file  . Food insecurity - worry: Not on file  . Food insecurity - inability: Not on file  . Transportation needs - medical: Not on file  . Transportation needs - non-medical: Not on file  Occupational History  . Occupation: math Product manager: [employer]  Tobacco Use  . Smoking status: Never Smoker  . Smokeless tobacco: Never Used  Substance and Sexual Activity  . Alcohol use: No    Alcohol/week: 0.0 oz  . Drug use: No  . Sexual activity: Yes  Partners: Male    Birth control/protection: Other-see comments    Comment:  husband - vasectomy  Other Topics Concern  . Not on file  Social History Narrative   Married to [place], had son [name] 11-00, jayden, and skylar   The PMH, PSH, Social History, Family History, Medications, and allergies have been reviewed in [facility], and have been updated if relevant.   Review of Systems  Neurological: Positive for headaches. Negative for dizziness, tremors, seizures, syncope, facial asymmetry, speech difficulty, weakness, light-headedness and numbness.  All other systems reviewed and are negative.      Objective:    BP (!) 154/98 (BP Location: Left Arm, Patient Position: Sitting, Cuff  Size: Normal)   Pulse 86   Temp 99 F (37.2 C) (Oral)   Ht 5\' 9"  (1.753 m)   Wt 229 lb 3.2 oz (104 kg)   LMP 09/16/2016 (Approximate)   SpO2 98%   BMI 33.85 kg/m    Physical Exam   General:  Well-developed,well-nourished,in no acute distress; alert,appropriate and cooperative throughout examination Head:  normocephalic and atraumatic.   Eyes:  vision grossly intact, PERRL Ears:  R ear normal and L ear normal externally, TMs clear bilaterally Nose:  no external deformity.   Mouth:  good dentition.   Neck:  No deformities, masses, or tenderness noted. Lungs:  Normal respiratory effort, chest expands symmetrically. Lungs are clear to auscultation, no crackles or wheezes. Heart:  Normal rate and regular rhythm. S1 and S2 normal without gallop, murmur, click, rub or other extra sounds. Msk:  No deformity or scoliosis noted of thoracic or lumbar spine.   Extremities:  No clubbing, cyanosis, edema, or deformity noted with normal full range of motion of all joints.   Neurologic:  alert & oriented X3 and gait normal.   Skin:  Intact without suspicious lesions or rashes Psych:  Cognition and judgment appear intact. Alert and cooperative with normal attention span and concentration. No apparent delusions, illusions, hallucinations       Assessment & Plan:   Need for Tdap vaccination - Plan: Tdap vaccine greater than or equal to 7yo IM  Frequent headaches No Follow-up on file.

## 2017-04-14 NOTE — Assessment & Plan Note (Signed)
Deteriorated. NO red flag symptoms- very consistent with tension headaches. Discussed importance of exercise, stress relief and other coping techniques. Continue headache journal. Call or return to clinic prn if these symptoms worsen or fail to improve as anticipated.  The patient indicates understanding of these issues and agrees with the plan.

## 2017-04-19 ENCOUNTER — Other Ambulatory Visit: Payer: Self-pay | Admitting: Family Medicine

## 2017-04-19 DIAGNOSIS — Z1231 Encounter for screening mammogram for malignant neoplasm of breast: Secondary | ICD-10-CM

## 2017-04-27 ENCOUNTER — Encounter: Payer: Self-pay | Admitting: Family Medicine

## 2017-04-27 ENCOUNTER — Ambulatory Visit: Payer: BLUE CROSS/BLUE SHIELD | Admitting: Family Medicine

## 2017-04-27 DIAGNOSIS — R590 Localized enlarged lymph nodes: Secondary | ICD-10-CM | POA: Insufficient documentation

## 2017-04-27 NOTE — Assessment & Plan Note (Signed)
Resolved.  Okay to proceed with screening mammogram.

## 2017-04-27 NOTE — Progress Notes (Signed)
Subjective:   Patient ID: Barbara [name], female    DOB: [birth year], 47 y.o.   MRN: [id number]  Barbara [name] is a pleasant 47 y.o. year old female who presents to clinic today with Lymphadenopathy (Patient is here today C/O a swollen lymph node in left axillary.  She received her Tdap on 1.10.19 and became febrile the following day at 101.  Swelling started on 1.12.19 with sorness but denies any erythema.  It has since completely gone away but she had called to schedule her Mammogram and when she mentioned the swelling they declined to schedule until after she has been seen by PCP in case diagnostic Mammogram is to be ordered.  She befeels was a reaction to the Tdap; no previous Rx.)  on 04/27/2017  HPI:  Right axillary lymphadenopathy- started two days after receiving her Tdap.  Also spiked at temp to 101.  Symptoms have since completely resolved but when she tried to schedule her screening mammogram was told she needed to see me first to make sure she did not need a diagnostic mammogram.   Current Outpatient Medications on File Prior to Visit  Medication Sig Dispense Refill  . Ferrous Sulfate (IRON) 325 (65 Fe) MG TABS Take 1 tablet by mouth daily.    Marland Kitchen tolterodine (DETROL) 1 MG tablet Take 1 tablet (1 mg total) by mouth 2 (two) times daily. 60 tablet 0   No current facility-administered medications on file prior to visit.     No Known Allergies  Past Medical History:  Diagnosis Date  . Arthritis   . Cyst, ovary, dermoid, left   . History of gestational diabetes   . History of Helicobacter pylori infection 09/2005  . 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 History of pregnancy induced hypertension   . Hyperlipidemia   . Hypertension    during pregnancy-no medications  . Iron deficiency anemia   . Menorrhagia   . Nephrolithiasis    bilateral tiny nonobstructive calculi per CT 06-21-2016  . Uterine fibroid   . Wears glasses     Past Surgical  History:  Procedure Laterality Date  . 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 CESAREAN SECTION  02-04-1999    vbac 1-06 and 05-19-2005  . ERCP W/ SPHINCTEROTOMY AND BALLOON DILATION  10/11/2005  . LAPAROSCOPIC CHOLECYSTECTOMY  10/04/2005   w/ ERCP  . LAPAROSCOPIC VAGINAL HYSTERECTOMY WITH SALPINGO OOPHORECTOMY Left 09/28/2016   Procedure: LAPAROSCOPIC ASSISTED VAGINAL HYSTERECTOMY WITH SALPINGO OOPHORECTOMY, right salpingectomy;  Surgeon: Dian Queen, MD;  Location: [place];  Service: Gynecology;  Laterality: Left;  NEED BED FOR OVERNIGHT STAY    Family History  Problem Relation Age of Onset  . Diabetes Mother   . Cancer Mother        breast and lung  . Hypertension Mother     Social History   Socioeconomic History  . Marital status: Married    Spouse name: Not on file  . Number of children: 3  . Years of education: Not on file  . Highest education level: Not on file  Social Needs  . Financial resource strain: Not on file  . Food insecurity - worry: Not on file  . Food insecurity - inability: Not on file  . Transportation needs - medical: Not on file  . Transportation needs - non-medical: Not on file  Occupational History  . Occupation: math Product manager: [employer]  Tobacco Use  . Smoking status: Never Smoker  . Smokeless tobacco: Never Used  Substance and Sexual Activity  . Alcohol use: No    Alcohol/week: 0.0 oz  . Drug use: No  . Sexual activity: Yes    Partners: Male    Birth control/protection: Other-see comments    Comment:  husband - vasectomy  Other Topics Concern  . Not on file  Social History Narrative   Married to [place], had son [name] 11-00, jayden, and skylar   The PMH, PSH, Social History, Family History, Medications, and allergies have been reviewed in [facility], and have been updated if relevant.   Review of Systems  Allergic/Immunologic: Negative.   All other systems reviewed and are  negative.      Objective:    BP (!) 140/100 (BP Location: Right Arm, Patient Position: Sitting, Cuff Size: Normal)   Pulse 85   Temp 99.2 F (37.3 C) (Oral)   Ht 5\' 9"  (1.753 m)   Wt 225 lb (102.1 kg)   LMP 09/16/2016 (Approximate)   SpO2 96%   BMI 33.23 kg/m    Physical Exam  Constitutional: She is oriented to person, place, and time. She appears well-developed and well-nourished. No distress.  HENT:  Head: Normocephalic and atraumatic.  Eyes: Conjunctivae are normal.  Cardiovascular: Normal rate.  Musculoskeletal: Normal range of motion.  Lymphadenopathy:    She has no axillary adenopathy.  Neurological: She is alert and oriented to person, place, and time. No cranial nerve deficit.  Skin: Skin is warm and dry.  Psychiatric: She has a normal mood and affect. Her behavior is normal. Judgment and thought content normal.  Nursing note and vitals reviewed.         Assessment & Plan:   Lymphadenopathy, axillary No Follow-up on file.

## 2017-05-18 ENCOUNTER — Ambulatory Visit
Admission: RE | Admit: 2017-05-18 | Discharge: 2017-05-18 | Disposition: A | Discharge status: Home / Self Care | Payer: BLUE CROSS/BLUE SHIELD | Source: Ambulatory Visit | Attending: Family Medicine | Admitting: Family Medicine

## 2017-05-18 DIAGNOSIS — Z1231 Encounter for screening mammogram for malignant neoplasm of breast: Secondary | ICD-10-CM

## 2017-05-20 ENCOUNTER — Encounter: Payer: Self-pay | Admitting: Nurse Practitioner

## 2017-05-20 ENCOUNTER — Ambulatory Visit (INDEPENDENT_AMBULATORY_CARE_PROVIDER_SITE_OTHER): Payer: BLUE CROSS/BLUE SHIELD | Admitting: Nurse Practitioner

## 2017-05-20 VITALS — BP 156/92 | HR 77 | Temp 98.6°F | Ht 69.0 in | Wt 224.0 lb

## 2017-05-20 DIAGNOSIS — I10 Essential (primary) hypertension: Secondary | ICD-10-CM | POA: Diagnosis not present

## 2017-05-20 DIAGNOSIS — R079 Chest pain, unspecified: Secondary | ICD-10-CM | POA: Diagnosis not present

## 2017-05-20 MED ORDER — LOSARTAN POTASSIUM 25 MG PO TABS: 25.0000 mg | ORAL_TABLET | Freq: Every day | ORAL | 0 refills | Status: DC

## 2017-05-20 NOTE — Patient Instructions (Signed)
Monitor BP once a day (morning) and record. Start DASH diet and push oral hydration with water mostly.   DASH Eating Plan DASH stands for "Dietary Approaches to Stop Hypertension." The DASH eating plan is a healthy eating plan that has been shown to reduce high blood pressure (hypertension). It may also reduce your risk for type 2 diabetes, heart disease, and stroke. The DASH eating plan may also help with weight loss. What are tips for following this plan? General guidelines  Avoid eating more than 2,300 mg (milligrams) of salt (sodium) a day. If you have hypertension, you may need to reduce your sodium intake to 1,500 mg a day.  Limit alcohol intake to no more than 1 drink a day for nonpregnant women and 2 drinks a day for men. One drink equals 12 oz of beer, 5 oz of wine, or 1 oz of hard liquor.  Work with your health care provider to maintain a healthy body weight or to lose weight. Ask what an ideal weight is for you.  Get at least 30 minutes of exercise that causes your heart to beat faster (aerobic exercise) most days of the week. Activities may include walking, swimming, or biking.  Work with your health care provider or diet and nutrition specialist (dietitian) to adjust your eating plan to your individual calorie needs. Reading food labels  Check food labels for the amount of sodium per serving. Choose foods with less than 5 percent of the Daily Value of sodium. Generally, foods with less than 300 mg of sodium per serving fit into this eating plan.  To find whole grains, look for the word "whole" as the first word in the ingredient list. Shopping  Buy products labeled as "low-sodium" or "no salt added."  Buy fresh foods. Avoid canned foods and premade or frozen meals. Cooking  Avoid adding salt when cooking. Use salt-free seasonings or herbs instead of table salt or sea salt. Check with your health care provider or pharmacist before using salt substitutes.  Do not fry  foods. Cook foods using healthy methods such as baking, boiling, grilling, and broiling instead.  Cook with heart-healthy oils, such as olive, canola, soybean, or sunflower oil. Meal planning   Eat a balanced diet that includes: ? 5 or more servings of fruits and vegetables each day. At each meal, try to fill half of your plate with fruits and vegetables. ? Up to 6-8 servings of whole grains each day. ? Less than 6 oz of lean meat, poultry, or fish each day. A 3-oz serving of meat is about the same size as a deck of cards. One egg equals 1 oz. ? 2 servings of low-fat dairy each day. ? A serving of nuts, seeds, or beans 5 times each week. ? Heart-healthy fats. Healthy fats called Omega-3 fatty acids are found in foods such as flaxseeds and coldwater fish, like sardines, salmon, and mackerel.  Limit how much you eat of the following: ? Canned or prepackaged foods. ? Food that is high in trans fat, such as fried foods. ? Food that is high in saturated fat, such as fatty meat. ? Sweets, desserts, sugary drinks, and other foods with added sugar. ? Full-fat dairy products.  Do not salt foods before eating.  Try to eat at least 2 vegetarian meals each week.  Eat more home-cooked food and less restaurant, buffet, and fast food.  When eating at a restaurant, ask that your food be prepared with less salt or no salt, if  possible. What foods are recommended? The items listed may not be a complete list. Talk with your dietitian about what dietary choices are best for you. Grains Whole-grain or whole-wheat bread. Whole-grain or whole-wheat pasta. Brown rice. [place]. Bulgur. Whole-grain and low-sodium cereals. Pita bread. Low-fat, low-sodium crackers. Whole-wheat flour tortillas. Vegetables Fresh or frozen vegetables (raw, steamed, roasted, or grilled). Low-sodium or reduced-sodium tomato and vegetable juice. Low-sodium or reduced-sodium tomato sauce and tomato paste. Low-sodium or  reduced-sodium canned vegetables. Fruits All fresh, dried, or frozen fruit. Canned fruit in natural juice (without added sugar). Meat and other protein foods Skinless chicken or [place]. Ground chicken or [place]. Pork with fat trimmed off. Fish and seafood. Egg whites. Dried beans, peas, or lentils. Unsalted nuts, nut butters, and seeds. Unsalted canned beans. Lean cuts of beef with fat trimmed off. Low-sodium, lean deli meat. Dairy Low-fat (1%) or fat-free (skim) milk. Fat-free, low-fat, or reduced-fat cheeses. Nonfat, low-sodium ricotta or cottage cheese. Low-fat or nonfat yogurt. Low-fat, low-sodium cheese. Fats and oils Soft margarine without trans fats. Vegetable oil. Low-fat, reduced-fat, or light mayonnaise and salad dressings (reduced-sodium). Canola, safflower, olive, soybean, and sunflower oils. Avocado. Seasoning and other foods Herbs. Spices. Seasoning mixes without salt. Unsalted popcorn and pretzels. Fat-free sweets. What foods are not recommended? The items listed may not be a complete list. Talk with your dietitian about what dietary choices are best for you. Grains Baked goods made with fat, such as croissants, muffins, or some breads. Dry pasta or rice meal packs. Vegetables Creamed or fried vegetables. Vegetables in a cheese sauce. Regular canned vegetables (not low-sodium or reduced-sodium). Regular canned tomato sauce and paste (not low-sodium or reduced-sodium). Regular tomato and vegetable juice (not low-sodium or reduced-sodium). Angie Fava. Olives. Fruits Canned fruit in a light or heavy syrup. Fried fruit. Fruit in cream or butter sauce. Meat and other protein foods Fatty cuts of meat. Ribs. Fried meat. Berniece Salines. Sausage. Bologna and other processed lunch meats. Salami. Fatback. Hotdogs. Bratwurst. Salted nuts and seeds. Canned beans with added salt. Canned or smoked fish. Whole eggs or egg yolks. Chicken or [place] with skin. Dairy Whole or 2% milk, cream, and half-and-half.  Whole or full-fat cream cheese. Whole-fat or sweetened yogurt. Full-fat cheese. Nondairy creamers. Whipped toppings. Processed cheese and cheese spreads. Fats and oils Butter. Stick margarine. Lard. Shortening. Ghee. Bacon fat. Tropical oils, such as coconut, palm kernel, or palm oil. Seasoning and other foods Salted popcorn and pretzels. Onion salt, garlic salt, seasoned salt, table salt, and sea salt. Worcestershire sauce. Tartar sauce. Barbecue sauce. Teriyaki sauce. Soy sauce, including reduced-sodium. Steak sauce. Canned and packaged gravies. Fish sauce. Oyster sauce. Cocktail sauce. Horseradish that you find on the shelf. Ketchup. Mustard. Meat flavorings and tenderizers. Bouillon cubes. Hot sauce and Tabasco sauce. Premade or packaged marinades. Premade or packaged taco seasonings. Relishes. Regular salad dressings. Where to find more information:  National Heart, Lung, and [place]: https://wilson-eaton.com/  American Heart Association: www.heart.org Summary  The DASH eating plan is a healthy eating plan that has been shown to reduce high blood pressure (hypertension). It may also reduce your risk for type 2 diabetes, heart disease, and stroke.  With the DASH eating plan, you should limit salt (sodium) intake to 2,300 mg a day. If you have hypertension, you may need to reduce your sodium intake to 1,500 mg a day.  When on the DASH eating plan, aim to eat more fresh fruits and vegetables, whole grains, lean proteins, low-fat dairy, and heart-healthy fats.  Work with your health care provider or diet and nutrition specialist (dietitian) to adjust your eating plan to your individual calorie needs. This information is not intended to replace advice given to you by your health care provider. Make sure you discuss any questions you have with your health care provider. Document Released: 03/11/2011 Document Revised: 03/15/2016 Document Reviewed: 03/15/2016 Elsevier Interactive Patient Education   [employer].

## 2017-05-20 NOTE — Progress Notes (Signed)
Subjective:  Patient ID: Barbara [name], female    DOB: 1971/01/03  Age: 47 y.o. MRN: [id number]  CC: Hypertension (high BP)   Hypertension  This is a chronic problem. The current episode started more than 1 year ago. The problem has been waxing and waning since onset. The problem is uncontrolled. Associated symptoms include chest pain and headaches. Pertinent negatives include no anxiety, blurred vision, malaise/fatigue, neck pain, orthopnea, palpitations, peripheral edema, PND, shortness of breath or sweats. Risk factors for coronary artery disease include diabetes mellitus, family history, obesity and sedentary lifestyle. Past treatments include nothing. There is no history of kidney disease, CAD/MI or heart failure. There is no history of a hypertension causing med, sleep apnea or a thyroid problem.  Chest Pain   This is a new problem. The current episode started yesterday. The onset quality is sudden. The problem occurs constantly. The problem has been unchanged. The pain is present in the lateral region. The pain is mild. The quality of the pain is described as dull. The pain does not radiate. Associated symptoms include headaches. Pertinent negatives include no diaphoresis, dizziness, exertional chest pressure, fever, irregular heartbeat, lower extremity edema, malaise/fatigue, nausea, orthopnea, palpitations, PND or shortness of breath. The pain is aggravated by nothing. She has tried nothing for the symptoms. Risk factors include obesity, lack of exercise and sedentary lifestyle.  Her past medical history is significant for hypertension.  Pertinent negatives for past medical history include no sleep apnea and no thyroid problem.  Her family medical history is significant for diabetes and hypertension.    no medication used in past. BP reading at home 170/100.  Outpatient Medications Prior to Visit  Medication Sig Dispense Refill  . Ferrous Sulfate (IRON) 325 (65 Fe) MG TABS Take 1  tablet by mouth daily.    Marland Kitchen tolterodine (DETROL) 1 MG tablet Take 1 tablet (1 mg total) by mouth 2 (two) times daily. 60 tablet 0   No facility-administered medications prior to visit.     ROS See HPI   ECG: NSR, no change compared to previous tracing 2016.  Objective:  BP (!) 156/92   Pulse 77   Temp 98.6 F (37 C)   Ht 5\' 9"  (1.753 m)   Wt 224 lb (101.6 kg)   LMP 09/16/2016 (Approximate)   SpO2 98%   BMI 33.08 kg/m   BP Readings from Last 3 Encounters:  05/20/17 (!) 156/92  04/27/17 (!) 140/100  04/14/17 (!) 154/98    Wt Readings from Last 3 Encounters:  05/20/17 224 lb (101.6 kg)  04/27/17 225 lb (102.1 kg)  04/14/17 229 lb 3.2 oz (104 kg)    Physical Exam  Constitutional: She is oriented to person, place, and time. No distress.  Neck: No JVD present.  Cardiovascular: Normal rate and regular rhythm. Exam reveals gallop.  Pulmonary/Chest: Effort normal.  Musculoskeletal: She exhibits no edema.  Neurological: She is alert and oriented to person, place, and time.  Vitals reviewed.   Lab Results  Component Value Date   WBC 4.8 02/21/2017   HGB 12.0 02/21/2017   HCT 36.3 02/21/2017   PLT 381.0 02/21/2017   GLUCOSE 122 (H) 02/21/2017   CHOL 214 (H) 02/21/2017   TRIG 48.0 02/21/2017   HDL 66.90 02/21/2017   LDLDIRECT 145.1 10/26/2012   LDLCALC 138 (H) 02/21/2017   ALT 16 02/21/2017   AST 16 02/21/2017   NA 138 02/21/2017   K 4.4 02/21/2017   CL 104 02/21/2017   CREATININE  0.82 02/21/2017   BUN 10 02/21/2017   CO2 29 02/21/2017   TSH 1.55 02/21/2017   HGBA1C 6.2 01/10/2015    Mm Screening Breast Tomo Bilateral  Result Date: 05/18/2017 CLINICAL DATA:  Screening. EXAM: DIGITAL SCREENING BILATERAL MAMMOGRAM WITH TOMO AND CAD COMPARISON:  Previous exam(s). ACR Breast Density Category c: The breast tissue is heterogeneously dense, which may obscure small masses. FINDINGS: There are no findings suspicious for malignancy. Images were processed with CAD.  IMPRESSION: No mammographic evidence of malignancy. A result letter of this screening mammogram will be mailed directly to the patient. RECOMMENDATION: Screening mammogram in one year. (Code:SM-B-01Y) BI-RADS CATEGORY  1: Negative. Electronically Signed   By: Trude Mcburney M.D.   On: 05/18/2017 15:06   Assessment & Plan:   Maysoon was seen today for hypertension.  Diagnoses and all orders for this visit:  HYPERTENSION, BENIGN SYSTEMIC -     losartan (COZAAR) 25 MG tablet; Take 1 tablet (25 mg total) by mouth daily.  Chest pain, unspecified type -     EKG 12-Lead   I am having Cosandra C. Caiazzo start on losartan. I am also having her maintain her Iron and tolterodine.  Meds ordered this encounter  Medications  . losartan (COZAAR) 25 MG tablet    Sig: Take 1 tablet (25 mg total) by mouth daily.    Dispense:  30 tablet    Refill:  0    Order Specific Question:   Supervising Provider    Answer:   Lucille Passy [[id number]]    Follow-up: Return in about 2 weeks (around 06/03/2017) for HTN with Dr. Deborra Medina.  Wilfred Lacy, NP

## 2017-05-21 ENCOUNTER — Emergency Department (HOSPITAL_COMMUNITY): Payer: BLUE CROSS/BLUE SHIELD

## 2017-05-21 ENCOUNTER — Emergency Department (HOSPITAL_COMMUNITY)
Admission: EM | Admit: 2017-05-21 | Discharge: 2017-05-21 | Disposition: A | Discharge status: Home / Self Care | Payer: BLUE CROSS/BLUE SHIELD | Attending: Emergency Medicine | Admitting: Emergency Medicine

## 2017-05-21 ENCOUNTER — Other Ambulatory Visit: Payer: Self-pay

## 2017-05-21 DIAGNOSIS — I10 Essential (primary) hypertension: Secondary | ICD-10-CM | POA: Insufficient documentation

## 2017-05-21 DIAGNOSIS — R51 Headache: Secondary | ICD-10-CM | POA: Insufficient documentation

## 2017-05-21 DIAGNOSIS — Z79899 Other long term (current) drug therapy: Secondary | ICD-10-CM | POA: Insufficient documentation

## 2017-05-21 DIAGNOSIS — R519 Headache, unspecified: Secondary | ICD-10-CM

## 2017-05-21 MED ORDER — SODIUM CHLORIDE 0.9 % IV BOLUS (SEPSIS)
1000.0000 mL | Freq: Once | INTRAVENOUS | Status: AC
  Administered 2017-05-21: 1000 mL via INTRAVENOUS

## 2017-05-21 MED ORDER — BUTALBITAL-APAP-CAFFEINE 50-325-40 MG PO TABS: 1.0000 | ORAL_TABLET | Freq: Four times a day (QID) | ORAL | 0 refills | Status: AC | PRN

## 2017-05-21 MED ORDER — DIPHENHYDRAMINE HCL 50 MG/ML IJ SOLN
25.0000 mg | Freq: Once | INTRAMUSCULAR | Status: AC
  Administered 2017-05-21: 25 mg via INTRAVENOUS
  Filled 2017-05-21: qty 1

## 2017-05-21 MED ORDER — METOCLOPRAMIDE HCL 5 MG/ML IJ SOLN
10.0000 mg | Freq: Once | INTRAMUSCULAR | Status: AC
  Administered 2017-05-21: 10 mg via INTRAVENOUS
  Filled 2017-05-21: qty 2

## 2017-05-21 MED ORDER — KETOROLAC TROMETHAMINE 30 MG/ML IJ SOLN: 30.0000 mg | Freq: Once | INTRAMUSCULAR | Status: DC

## 2017-05-21 NOTE — ED Provider Notes (Signed)
[place] EMERGENCY DEPARTMENT Provider Note   CSN: [id number] Arrival date & time: 05/21/17  0932     History   Chief Complaint Chief Complaint  Patient presents with  . Headache    HPI Barbara [name] is a 47 y.o. female.  HPI   47 year old female history of hypertension, hyperlipidemia, anemia presenting for evaluation of headache.  Patient developed acute onset of frontal headache that started approximately 2 hours ago when she wakes up.  She describes headache as a tightness throbbing sensation to her forehead, with associated nausea, and mild discomfort to her right arm.  She took 2 ibuprofen prior to arrival.  Headache is currently moderate in severity with some light sensitivity.  No associated fever, diplopia, URI symptoms, neck stiffness, chest pain, trouble breathing, abdominal pain, confusion, or rash.  She mentioned having recurrent headache ongoing for the past 3-4 months.  Headache is somewhat similar to this headache.  She did discuss Her Headache with Her Primary Care Provider.  She kept a headache diary and found out that headaches is usually associated with stress.  Through some stress modification behavior, her headache did improve however it recently returned.  She was seen at her ophthalmologist for a regular eye checkup  2 days ago.  Although her vision she was told that her blood pressure was high.  She was seen by her PCP yesterday for high blood pressure is the same, and was prescribed losartan 25 mg once daily however she have not had the medication filled yet.  She is here today with her headache as well as feeling concerned.  She admits to increasing stress.  She denies self-medicating with alcohol or recreational drugs.  No prior history of stroke.  Past Medical History:  Diagnosis Date  . Arthritis   . Cyst, ovary, dermoid, left   . History of gestational diabetes   . History of Helicobacter pylori infection 09/2005  . History of  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 07/ 2017  . History of pregnancy induced hypertension   . Hyperlipidemia   . Hypertension    during pregnancy-no medications  . Iron deficiency anemia   . Menorrhagia   . Nephrolithiasis    bilateral tiny nonobstructive calculi per CT 06-21-2016  . Uterine fibroid   . Wears glasses     Patient Active Problem List   Diagnosis Date Noted  . Lymphadenopathy, axillary 04/27/2017  . Frequent headaches 04/14/2017  . S/P laparoscopic assisted vaginal hysterectomy (LAVH) 09/28/2016  . Insomnia 02/18/2014  . History of gestational diabetes 10/26/2012  . HLD (hyperlipidemia) 10/23/2009  . HYPERTENSION, BENIGN SYSTEMIC 06/02/2006    Past Surgical History:  Procedure Laterality Date  . BREAST EXCISIONAL BIOPSY Left 1982   benign  . 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 CESAREAN SECTION  02-04-1999    vbac 1-06 and 05-19-2005  . ERCP W/ SPHINCTEROTOMY AND BALLOON DILATION  10/11/2005  . LAPAROSCOPIC CHOLECYSTECTOMY  10/04/2005   w/ ERCP  . LAPAROSCOPIC VAGINAL HYSTERECTOMY WITH SALPINGO OOPHORECTOMY Left 09/28/2016   Procedure: LAPAROSCOPIC ASSISTED VAGINAL HYSTERECTOMY WITH SALPINGO OOPHORECTOMY, right salpingectomy;  Surgeon: Dian Queen, MD;  Location: [facility];  Service: Gynecology;  Laterality: Left;  NEED BED FOR OVERNIGHT STAY    OB History    Gravida Para Term Preterm AB Living   4       1 3    SAB TAB Ectopic Multiple Live Births   1  Home Medications    Prior to Admission medications   Medication Sig Start Date End Date Taking? Authorizing Provider  Ferrous Sulfate (IRON) 325 (65 Fe) MG TABS Take 1 tablet by mouth daily.    [provider]  losartan (COZAAR) 25 MG tablet Take 1 tablet (25 mg total) by mouth daily. 05/20/17   Nche, Charlene Brooke, NP  tolterodine (DETROL) 1 MG tablet Take 1 tablet (1 mg total) by mouth 2 (two) times daily. 02/23/17   Lucille Passy, MD     Family History Family History  Problem Relation Age of Onset  . Diabetes Mother   . Cancer Mother        breast and lung  . Hypertension Mother     Social History Social History   Tobacco Use  . Smoking status: Never Smoker  . Smokeless tobacco: Never Used  Substance Use Topics  . Alcohol use: No    Alcohol/week: 0.0 oz  . Drug use: No     Allergies   Patient has no known allergies.   Review of Systems Review of Systems  All other systems reviewed and are negative.    Physical Exam Updated Vital Signs LMP 09/16/2016 (Approximate)   Physical Exam  Constitutional: She appears well-developed and well-nourished. No distress.  Patient laying in bed, room was dim, eyes closed, nontoxic in appearance  HENT:  Head: Normocephalic and atraumatic.  Mouth/Throat: Oropharynx is clear and moist.  Eyes: Conjunctivae and EOM are normal. Pupils are equal, round, and reactive to light.  Neck: Normal range of motion. Neck supple.  No nuchal rigidity  Cardiovascular: Normal rate and regular rhythm.  Pulmonary/Chest: Effort normal and breath sounds normal.  Abdominal: Soft. There is no tenderness.  Musculoskeletal: Normal range of motion.  Neurological: She is alert. GCS eye subscore is 4. GCS verbal subscore is 5. GCS motor subscore is 6.  Neurologic exam:  Speech clear, pupils equal round reactive to light, extraocular movements intact  Normal peripheral visual fields Cranial nerves III through XII normal including no facial droop Follows commands, moves all extremities x4, normal strength to bilateral upper and lower extremities at all major muscle groups including grip Sensation normal to light touch  Coordination intact, no limb ataxia, finger-nose-finger normal Rapid alternating movements normal No pronator drift Gait normal   Skin: Skin is warm. No rash noted.  Psychiatric: She has a normal mood and affect.  Nursing note and vitals reviewed.    ED  Treatments / Results  Labs (all labs ordered are listed, but only abnormal results are displayed) Labs Reviewed - No data to display  EKG  EKG Interpretation None     EKG: Normal sinus rhythm.  Ventricular rate 84.  RSR prime in V1 and 2.  QT 516.  Otherwise normal intervals.  No acute ischemic changes.      Radiology Ct Head Wo Contrast  Result Date: 05/21/2017 CLINICAL DATA:  Frontal headache. EXAM: CT HEAD WITHOUT CONTRAST TECHNIQUE: Contiguous axial images were obtained from the base of the skull through the vertex without intravenous contrast. COMPARISON:  None. FINDINGS: Brain: No evidence of an acute infarct, acute hemorrhage, mass lesion, mass effect or hydrocephalus. Vascular: No hyperdense vessel or unexpected calcification. Skull: Normal. Negative for fracture or focal lesion. Sinuses/Orbits: No acute finding. Other: None. IMPRESSION: Negative. Electronically Signed   By: Lorin Picket M.D.   On: 05/21/2017 07:54    Procedures Procedures (including critical care time)  Medications Ordered in ED Medications - No  data to display   Initial Impression / Assessment and Plan / ED Course  I have reviewed the triage vital signs and the nursing notes.  Pertinent labs & imaging results that were available during my care of the patient were reviewed by me and considered in my medical decision making (see chart for details).     BP (!) 147/86   Pulse 64   Temp 98.3 F (36.8 C) (Oral)   Resp 16   Ht 5\' 9"  (1.753 m)   Wt 101.6 kg (224 lb)   LMP 09/16/2016 (Approximate)   SpO2 98%   BMI 33.08 kg/m    Final Clinical Impressions(s) / ED Diagnoses   Final diagnoses:  Bad headache    ED Discharge Orders        Ordered    butalbital-acetaminophen-caffeine (FIORICET, ESGIC) 50-325-40 MG tablet  Every 6 hours PRN     05/21/17 1104     7:18 AM Patient here with concerns of high blood pressure as well as having recurrent headache.  Now she complaining of some nausea  and some right arm discomfort and tingling sensation.  On exam, she has no focal neuro deficit, normal sensation throughout bilateral arms.  She has no nuchal rigidity to suggest meningitis.  Her blood pressure is elevated at 183/116.  Plan to obtain head CT scan to rule out hemorrhagic stroke although my suspicion is low.  Migraine cocktail provided.  11:10 AM Head CT scan unremarkable.  Patient received migraine cocktail and felt much better afterward.  She is stable for discharge.  Patient discharged home with Fioricet for headache.  Encourage patient to take blood pressure medication recently prescribed and to follow-up with her PCP for further care.  Return precautions discussed.  Her blood pressure did improve after migraine cocktail.   Domenic Moras, PA-C 05/21/17 1110    Duffy Bruce, MD 05/21/17 [phone]

## 2017-05-21 NOTE — ED Triage Notes (Signed)
Patient c/o headache (center-frontal), right lower arm pain, and nausea. Patient was seen by her primary care provider yesterday for same. States that these headaches have been intermittent x12-18 months.

## 2017-05-21 NOTE — ED Notes (Signed)
Patient transported to CT 

## 2017-05-21 NOTE — ED Notes (Signed)
Declined W/C at D/C and was escorted to lobby by RN. 

## 2017-05-21 NOTE — Discharge Instructions (Signed)
Please take your blood pressure medication as prescribed.  Take fioricet as needed for headache.  Follow up with your doctor for further care.

## 2017-05-21 NOTE — ED Provider Notes (Signed)
Medical screening examination/treatment/procedure(s) were performed by non-physician practitioner and as supervising physician I was immediately available for consultation/collaboration.  EKG: Normal sinus rhythm.  Ventricular rate 84.  RSR prime in V1 and 2.  QT 516.  Otherwise normal intervals.  No acute ischemic changes.   Duffy Bruce, MD 05/21/17 [phone]

## 2017-05-30 ENCOUNTER — Other Ambulatory Visit: Payer: Self-pay | Admitting: Family Medicine

## 2017-06-06 ENCOUNTER — Ambulatory Visit (INDEPENDENT_AMBULATORY_CARE_PROVIDER_SITE_OTHER): Payer: BLUE CROSS/BLUE SHIELD | Admitting: Family Medicine

## 2017-06-06 ENCOUNTER — Encounter: Payer: Self-pay | Admitting: Family Medicine

## 2017-06-06 DIAGNOSIS — I10 Essential (primary) hypertension: Secondary | ICD-10-CM | POA: Diagnosis not present

## 2017-06-06 NOTE — Assessment & Plan Note (Signed)
Well controlled on low dose Cozaar. Continue current rx. She will keep a check on her BP at home. The patient indicates understanding of these issues and agrees with the plan.

## 2017-06-06 NOTE — Progress Notes (Signed)
Subjective:   Patient ID: Barbara [name], female    DOB: [birth year], 47 y.o.   MRN: [id number]  Barbara [name] is a pleasant 47 y.o. year old female who presents to clinic today with Hypertension (Patient is here today for a 2-week-F/U HTN.  Was started on Rx for Losartan on 2.15.19 but did not get filled that day.  On 2.16.19 was seen at ED.  EKG & CT head completed.)  on 06/06/2017  HPI:  Chart reviewed. Saw [facility] on 05/20/17 for elevated BP and chest pain. EKG reassuring. Started on Losartan 25 mg daily. Advised follow up in 2 weeks.  Went to the ED the following day, 05/21/17 for bad headache. Head CT neg.  Given migraine cocktail and sent home with fioricet to use as needed for headaches.  Only had to take fiorect once.  Headaches have resolved.  Has not had any additional chest pain.  Ct Head Wo Contrast  Result Date: 05/21/2017 CLINICAL DATA:  Frontal headache. EXAM: CT HEAD WITHOUT CONTRAST TECHNIQUE: Contiguous axial images were obtained from the base of the skull through the vertex without intravenous contrast. COMPARISON:  None. FINDINGS: Brain: No evidence of an acute infarct, acute hemorrhage, mass lesion, mass effect or hydrocephalus. Vascular: No hyperdense vessel or unexpected calcification. Skull: Normal. Negative for fracture or focal lesion. Sinuses/Orbits: No acute finding. Other: None. IMPRESSION: Negative. Electronically Signed   By: Lorin Picket M.D.   On: 05/21/2017 07:54   Mm Screening Breast Tomo Bilateral  Result Date: 05/18/2017 CLINICAL DATA:  Screening. EXAM: DIGITAL SCREENING BILATERAL MAMMOGRAM WITH TOMO AND CAD COMPARISON:  Previous exam(s). ACR Breast Density Category c: The breast tissue is heterogeneously dense, which may obscure small masses. FINDINGS: There are no findings suspicious for malignancy. Images were processed with CAD. IMPRESSION: No mammographic evidence of malignancy. A result letter of this screening mammogram will be mailed  directly to the patient. RECOMMENDATION: Screening mammogram in one year. (Code:SM-B-01Y) BI-RADS CATEGORY  1: Negative. Electronically Signed   By: Trude Mcburney M.D.   On: 05/18/2017 15:06   Current Outpatient Medications on File Prior to Visit  Medication Sig Dispense Refill  . butalbital-acetaminophen-caffeine (FIORICET, ESGIC) 50-325-40 MG tablet Take 1 tablet by mouth every 6 (six) hours as needed for headache or migraine. 20 tablet 0  . Ferrous Sulfate (IRON) 325 (65 Fe) MG TABS Take 1 tablet by mouth daily.    Marland Kitchen losartan (COZAAR) 25 MG tablet Take 1 tablet (25 mg total) by mouth daily. 30 tablet 0  . tolterodine (DETROL) 1 MG tablet TAKE 1 TABLET BY MOUTH TWICE DAILY 60 tablet 0   No current facility-administered medications on file prior to visit.     No Known Allergies  Past Medical History:  Diagnosis Date  . Arthritis   . Cyst, ovary, dermoid, left   . History of gestational diabetes   . History of Helicobacter pylori infection 09/2005  . 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 History of pregnancy induced hypertension   . Hyperlipidemia   . Hypertension    during pregnancy-no medications  . Iron deficiency anemia   . Menorrhagia   . Nephrolithiasis    bilateral tiny nonobstructive calculi per CT 06-21-2016  . Uterine fibroid   . Wears glasses     Past Surgical History:  Procedure Laterality Date  . BREAST EXCISIONAL BIOPSY Left 1982   benign  . 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  CESAREAN SECTION  02-04-1999    vbac 1-06 and 05-19-2005  . ERCP W/ SPHINCTEROTOMY AND BALLOON DILATION  10/11/2005  . LAPAROSCOPIC CHOLECYSTECTOMY  10/04/2005   w/ ERCP  . LAPAROSCOPIC VAGINAL HYSTERECTOMY WITH SALPINGO OOPHORECTOMY Left 09/28/2016   Procedure: LAPAROSCOPIC ASSISTED VAGINAL HYSTERECTOMY WITH SALPINGO OOPHORECTOMY, right salpingectomy;  Surgeon: Dian Queen, MD;  Location: [facility];  Service: Gynecology;   Laterality: Left;  NEED BED FOR OVERNIGHT STAY    Family History  Problem Relation Age of Onset  . Diabetes Mother   . Cancer Mother        breast and lung  . Hypertension Mother     Social History   Socioeconomic History  . Marital status: Married    Spouse name: Not on file  . Number of children: 3  . Years of education: Not on file  . Highest education level: Not on file  Social Needs  . Financial resource strain: Not on file  . Food insecurity - worry: Not on file  . Food insecurity - inability: Not on file  . Transportation needs - medical: Not on file  . Transportation needs - non-medical: Not on file  Occupational History  . Occupation: math Product manager: [employer]  Tobacco Use  . Smoking status: Never Smoker  . Smokeless tobacco: Never Used  Substance and Sexual Activity  . Alcohol use: No    Alcohol/week: 0.0 oz  . Drug use: No  . Sexual activity: Yes    Partners: Male    Birth control/protection: Other-see comments    Comment:  husband - vasectomy  Other Topics Concern  . Not on file  Social History Narrative   Married to [place], had son [name] 11-00, jayden, and skylar   The PMH, PSH, Social History, Family History, Medications, and allergies have been reviewed in [facility], and have been updated if relevant.    Review of Systems  Constitutional: Negative.   HENT: Negative.   Respiratory: Negative.   Cardiovascular: Negative.   Gastrointestinal: Negative.   Musculoskeletal: Negative.   Neurological: Negative.   Hematological: Negative.   Psychiatric/Behavioral: Negative.   All other systems reviewed and are negative.      Objective:    BP (!) 138/94 (BP Location: Left Arm, Patient Position: Sitting, Cuff Size: Normal)   Pulse 98   Temp 98.5 F (36.9 C) (Oral)   Ht 5\' 9"  (1.753 m)   Wt 225 lb 12.8 oz (102.4 kg)   LMP 09/16/2016 (Approximate)   SpO2 97%   BMI 33.34 kg/m    Physical Exam  Constitutional: She  is oriented to person, place, and time. She appears well-developed and well-nourished. No distress.  HENT:  Head: Normocephalic and atraumatic.  Eyes: Conjunctivae are normal.  Neck: Normal range of motion.  Cardiovascular: Normal rate and regular rhythm.  Pulmonary/Chest: Effort normal and breath sounds normal.  Musculoskeletal: Normal range of motion. She exhibits no edema.  Neurological: She is alert and oriented to person, place, and time. No cranial nerve deficit.  Skin: Skin is warm and dry. She is not diaphoretic.  Psychiatric: She has a normal mood and affect. Her behavior is normal. Judgment and thought content normal.  Nursing note and vitals reviewed.         Assessment & Plan:   Essential hypertension No Follow-up on file.

## 2017-06-06 NOTE — Patient Instructions (Signed)
Great to see you. Your blood pressure looks great!

## 2017-06-22 ENCOUNTER — Other Ambulatory Visit: Payer: Self-pay

## 2017-06-22 DIAGNOSIS — I10 Essential (primary) hypertension: Secondary | ICD-10-CM

## 2017-06-22 MED ORDER — LOSARTAN POTASSIUM 25 MG PO TABS: 25.0000 mg | ORAL_TABLET | Freq: Every day | ORAL | 1 refills | Status: DC

## 2017-11-18 ENCOUNTER — Other Ambulatory Visit: Payer: Self-pay | Admitting: Family Medicine

## 2017-11-18 DIAGNOSIS — I10 Essential (primary) hypertension: Secondary | ICD-10-CM

## 2017-12-27 ENCOUNTER — Ambulatory Visit (INDEPENDENT_AMBULATORY_CARE_PROVIDER_SITE_OTHER): Payer: BLUE CROSS/BLUE SHIELD | Admitting: Nurse Practitioner

## 2017-12-27 ENCOUNTER — Encounter: Payer: Self-pay | Admitting: Nurse Practitioner

## 2017-12-27 ENCOUNTER — Ambulatory Visit (INDEPENDENT_AMBULATORY_CARE_PROVIDER_SITE_OTHER): Payer: BLUE CROSS/BLUE SHIELD

## 2017-12-27 ENCOUNTER — Encounter: Payer: Self-pay | Admitting: Family Medicine

## 2017-12-27 VITALS — BP 142/86 | HR 103 | Temp 99.9°F | Ht 69.0 in | Wt 216.4 lb

## 2017-12-27 DIAGNOSIS — J189 Pneumonia, unspecified organism: Secondary | ICD-10-CM

## 2017-12-27 DIAGNOSIS — J00 Acute nasopharyngitis [common cold]: Secondary | ICD-10-CM | POA: Diagnosis not present

## 2017-12-27 DIAGNOSIS — J209 Acute bronchitis, unspecified: Secondary | ICD-10-CM

## 2017-12-27 DIAGNOSIS — J181 Lobar pneumonia, unspecified organism: Secondary | ICD-10-CM

## 2017-12-27 DIAGNOSIS — R197 Diarrhea, unspecified: Secondary | ICD-10-CM | POA: Diagnosis not present

## 2017-12-27 DIAGNOSIS — R05 Cough: Secondary | ICD-10-CM | POA: Diagnosis not present

## 2017-12-27 MED ORDER — ALBUTEROL SULFATE (2.5 MG/3ML) 0.083% IN NEBU
2.5000 mg | INHALATION_SOLUTION | Freq: Once | RESPIRATORY_TRACT | Status: AC
  Administered 2017-12-27: 2.5 mg via RESPIRATORY_TRACT

## 2017-12-27 MED ORDER — ALBUTEROL SULFATE HFA 108 (90 BASE) MCG/ACT IN AERS: 1.0000 | INHALATION_SPRAY | Freq: Four times a day (QID) | RESPIRATORY_TRACT | 0 refills | Status: DC | PRN

## 2017-12-27 MED ORDER — SACCHAROMYCES BOULARDII 250 MG PO CAPS: 250.0000 mg | ORAL_CAPSULE | Freq: Two times a day (BID) | ORAL | Status: DC

## 2017-12-27 MED ORDER — LEVOFLOXACIN 500 MG PO TABS: 500.0000 mg | ORAL_TABLET | Freq: Every day | ORAL | 0 refills | Status: AC

## 2017-12-27 MED ORDER — IPRATROPIUM BROMIDE 0.03 % NA SOLN: 2.0000 | Freq: Two times a day (BID) | NASAL | 0 refills | Status: DC

## 2017-12-27 MED ORDER — BENZONATATE 100 MG PO CAPS: 100.0000 mg | ORAL_CAPSULE | Freq: Three times a day (TID) | ORAL | 0 refills | Status: DC | PRN

## 2017-12-27 MED ORDER — GUAIFENESIN-DM 100-10 MG/5ML PO SYRP: 5.0000 mL | ORAL_SOLUTION | ORAL | 0 refills | Status: DC | PRN

## 2017-12-27 NOTE — Progress Notes (Signed)
Subjective:  Patient ID: Barbara [name], female    DOB: 08/18/1970  Age: 47 y.o. MRN: [id number]  CC: Cough (started last Tuesday. Diarrhea, chills, dry cough. Tried OTC meds but no relief.)   Cough  This is a new problem. The current episode started in the past 7 days. The problem has been waxing and waning. The cough is productive of sputum. Associated symptoms include chills, a fever, headaches, nasal congestion, postnasal drip, rhinorrhea, a sore throat and wheezing. Pertinent negatives include no chest pain, heartburn, rash, shortness of breath or sweats. The symptoms are aggravated by lying down. She has tried OTC cough suppressant for the symptoms. The treatment provided no relief.  Diarrhea   This is a new problem. The current episode started in the past 7 days. The problem occurs less than 2 times per day. The problem has been unchanged. The stool consistency is described as watery. The patient states that diarrhea does not awaken her from sleep. Associated symptoms include chills, coughing, a fever, headaches and a URI. Pertinent negatives include no abdominal pain, bloating, increased  flatus, sweats or vomiting. Risk factors include ill contacts. She has tried nothing for the symptoms.   Reviewed past Medical, Social and Family history today.  Outpatient Medications Prior to Visit  Medication Sig Dispense Refill  . butalbital-acetaminophen-caffeine (FIORICET, ESGIC) 50-325-40 MG tablet Take 1 tablet by mouth every 6 (six) hours as needed for headache or migraine. 20 tablet 0  . Ferrous Sulfate (IRON) 325 (65 Fe) MG TABS Take 1 tablet by mouth daily.    Marland Kitchen losartan (COZAAR) 25 MG tablet TAKE 1 TABLET BY MOUTH ONCE DAILY 30 tablet 1  . tolterodine (DETROL) 1 MG tablet TAKE 1 TABLET BY MOUTH TWICE DAILY 60 tablet 0   No facility-administered medications prior to visit.     ROS See HPI  Objective:  BP (!) 142/86 (BP Location: Left Arm, Patient Position: Sitting, Cuff Size:  Normal)   Pulse (!) 103   Temp 99.9 F (37.7 C) (Oral)   Ht 5\' 9"  (1.753 m)   Wt 216 lb 6.4 oz (98.2 kg)   LMP 09/16/2016 (Approximate)   SpO2 95%   BMI 31.96 kg/m   BP Readings from Last 3 Encounters:  12/27/17 (!) 142/86  06/06/17 (!) 138/94  05/21/17 (!) 146/75    Wt Readings from Last 3 Encounters:  12/27/17 216 lb 6.4 oz (98.2 kg)  06/06/17 225 lb 12.8 oz (102.4 kg)  05/21/17 224 lb (101.6 kg)    Physical Exam  Constitutional: She is oriented to person, place, and time.  HENT:  Right Ear: Tympanic membrane, external ear and ear canal normal.  Left Ear: Tympanic membrane, external ear and ear canal normal.  Nose: Mucosal edema and rhinorrhea present. Right sinus exhibits maxillary sinus tenderness. Left sinus exhibits maxillary sinus tenderness.  Mouth/Throat: Uvula is midline. Posterior oropharyngeal erythema present. No oropharyngeal exudate.  Neck: Normal range of motion. Neck supple. No thyromegaly present.  Cardiovascular: Normal rate and regular rhythm.  Pulmonary/Chest: Effort normal. She has wheezes. She has rales.  Abdominal: Soft. Bowel sounds are normal.  Lymphadenopathy:    She has cervical adenopathy.  Neurological: She is alert and oriented to person, place, and time.  Skin: No rash noted.  Vitals reviewed.   Lab Results  Component Value Date   WBC 4.8 02/21/2017   HGB 12.0 02/21/2017   HCT 36.3 02/21/2017   PLT 381.0 02/21/2017   GLUCOSE 122 (H) 02/21/2017   CHOL  214 (H) 02/21/2017   TRIG 48.0 02/21/2017   HDL 66.90 02/21/2017   LDLDIRECT 145.1 10/26/2012   LDLCALC 138 (H) 02/21/2017   ALT 16 02/21/2017   AST 16 02/21/2017   NA 138 02/21/2017   K 4.4 02/21/2017   CL 104 02/21/2017   CREATININE 0.82 02/21/2017   BUN 10 02/21/2017   CO2 29 02/21/2017   TSH 1.55 02/21/2017   HGBA1C 6.2 01/10/2015    Ct Head Wo Contrast  Result Date: 05/21/2017 CLINICAL DATA:  Frontal headache. EXAM: CT HEAD WITHOUT CONTRAST TECHNIQUE: Contiguous axial  images were obtained from the base of the skull through the vertex without intravenous contrast. COMPARISON:  None. FINDINGS: Brain: No evidence of an acute infarct, acute hemorrhage, mass lesion, mass effect or hydrocephalus. Vascular: No hyperdense vessel or unexpected calcification. Skull: Normal. Negative for fracture or focal lesion. Sinuses/Orbits: No acute finding. Other: None. IMPRESSION: Negative. Electronically Signed   By: Lorin Picket M.D.   On: 05/21/2017 07:54    Assessment & Plan:   Tiffinie was seen today for cough.  Diagnoses and all orders for this visit:  Community acquired pneumonia of right upper lobe of lung ([place]) -     albuterol (PROVENTIL) (2.5 MG/3ML) 0.083% nebulizer solution 2.5 mg -     DG Chest 2 View -     benzonatate (TESSALON) 100 MG capsule; Take 1 capsule (100 mg total) by mouth 3 (three) times daily as needed for cough. -     Discontinue: guaiFENesin-dextromethorphan (ROBITUSSIN DM) 100-10 MG/5ML syrup; Take 5 mLs by mouth every 4 (four) hours as needed for cough. -     guaiFENesin-dextromethorphan (ROBITUSSIN DM) 100-10 MG/5ML syrup; Take 5 mLs by mouth every 4 (four) hours as needed for cough. -     albuterol (PROVENTIL HFA;VENTOLIN HFA) 108 (90 Base) MCG/ACT inhaler; Inhale 1-2 puffs into the lungs every 6 (six) hours as needed for wheezing or shortness of breath. -     levofloxacin (LEVAQUIN) 500 MG tablet; Take 1 tablet (500 mg total) by mouth daily for 7 days. -     DG Chest 2 View; Future  Diarrhea, unspecified type -     saccharomyces boulardii (FLORASTOR) 250 MG capsule; Take 1 capsule (250 mg total) by mouth 2 (two) times daily.  Acute nasopharyngitis -     benzonatate (TESSALON) 100 MG capsule; Take 1 capsule (100 mg total) by mouth 3 (three) times daily as needed for cough. -     Discontinue: guaiFENesin-dextromethorphan (ROBITUSSIN DM) 100-10 MG/5ML syrup; Take 5 mLs by mouth every 4 (four) hours as needed for cough. -     ipratropium  (ATROVENT) 0.03 % nasal spray; Place 2 sprays into both nostrils 2 (two) times daily. Do not use for more than 5days. -     guaiFENesin-dextromethorphan (ROBITUSSIN DM) 100-10 MG/5ML syrup; Take 5 mLs by mouth every 4 (four) hours as needed for cough.   I am having Amylynn C. Helseth start on benzonatate, ipratropium, albuterol, levofloxacin, and saccharomyces boulardii. I am also having her maintain her Iron, butalbital-acetaminophen-caffeine, tolterodine, losartan, and guaiFENesin-dextromethorphan. We administered albuterol.  Meds ordered this encounter  Medications  . albuterol (PROVENTIL) (2.5 MG/3ML) 0.083% nebulizer solution 2.5 mg  . benzonatate (TESSALON) 100 MG capsule    Sig: Take 1 capsule (100 mg total) by mouth 3 (three) times daily as needed for cough.    Dispense:  20 capsule    Refill:  0    Order Specific Question:   Supervising Provider  Answer:   Lucille Passy [[id number]]  . DISCONTD: guaiFENesin-dextromethorphan (ROBITUSSIN DM) 100-10 MG/5ML syrup    Sig: Take 5 mLs by mouth every 4 (four) hours as needed for cough.    Dispense:  118 mL    Refill:  0    Order Specific Question:   Supervising Provider    Answer:   Lucille Passy [[id number]]  . ipratropium (ATROVENT) 0.03 % nasal spray    Sig: Place 2 sprays into both nostrils 2 (two) times daily. Do not use for more than 5days.    Dispense:  30 mL    Refill:  0    Order Specific Question:   Supervising Provider    Answer:   Lucille Passy [[id number]]  . guaiFENesin-dextromethorphan (ROBITUSSIN DM) 100-10 MG/5ML syrup    Sig: Take 5 mLs by mouth every 4 (four) hours as needed for cough.    Dispense:  118 mL    Refill:  0    Order Specific Question:   Supervising Provider    Answer:   Lucille Passy [[id number]]  . albuterol (PROVENTIL HFA;VENTOLIN HFA) 108 (90 Base) MCG/ACT inhaler    Sig: Inhale 1-2 puffs into the lungs every 6 (six) hours as needed for wheezing or shortness of breath.    Dispense:  1 Inhaler    Refill:  0     Order Specific Question:   Supervising Provider    Answer:   Lucille Passy [[id number]]  . levofloxacin (LEVAQUIN) 500 MG tablet    Sig: Take 1 tablet (500 mg total) by mouth daily for 7 days.    Dispense:  7 tablet    Refill:  0    Order Specific Question:   Supervising Provider    Answer:   Lucille Passy [[id number]]  . saccharomyces boulardii (FLORASTOR) 250 MG capsule    Sig: Take 1 capsule (250 mg total) by mouth 2 (two) times daily.    Order Specific Question:   Supervising Provider    Answer:   Lucille Passy [[id number]]    Follow-up: Return if symptoms worsen or fail to improve.  Wilfred Lacy, NP

## 2017-12-27 NOTE — Patient Instructions (Addendum)
CXR indicates pneumonia. Oral abx sent. Need to return to lab for repeat CXR on or after 01/17/2018  Alternate between robitussin and benzonatate for cough.  Diarrhea, Adult Diarrhea is when you have loose and water poop (stool) often. Diarrhea can make you feel weak and cause you to get dehydrated. Dehydration can make you tired and thirsty, make you have a dry mouth, and make it so you pee (urinate) less often. Diarrhea often lasts 2-3 days. However, it can last longer if it is a sign of something more serious. It is important to treat your diarrhea as told by your doctor. Follow these instructions at home: Eating and drinking  Follow these recommendations as told by your doctor:  Take an oral rehydration solution (ORS). This is a drink that is sold at pharmacies and stores.  Drink clear fluids, such as: ? Water. ? Ice chips. ? Diluted fruit juice. ? Low-calorie sports drinks.  Eat bland, easy-to-digest foods in small amounts as you are able. These foods include: ? Bananas. ? Applesauce. ? Rice. ? Low-fat (lean) meats. ? Toast. ? Crackers.  Avoid drinking fluids that have a lot of sugar or caffeine in them.  Avoid alcohol.  Avoid spicy or fatty foods.  General instructions   Drink enough fluid to keep your pee (urine) clear or pale yellow.  Wash your hands often. If you cannot use soap and water, use hand sanitizer.  Make sure that all people in your home wash their hands well and often.  Take over-the-counter and prescription medicines only as told by your doctor.  Rest at home while you get better.  Watch your condition for any changes.  Take a warm bath to help with any burning or pain from having diarrhea.  Keep all follow-up visits as told by your doctor. This is important. Contact a doctor if:  You have a fever.  Your diarrhea gets worse.  You have new symptoms.  You cannot keep fluids down.  You feel light-headed or dizzy.  You have a  headache.  You have muscle cramps. Get help right away if:  You have chest pain.  You feel very weak or you pass out (faint).  You have bloody or black poop or poop that look like tar.  You have very bad pain, cramping, or bloating in your belly (abdomen).  You have trouble breathing or you are breathing very quickly.  Your heart is beating very quickly.  Your skin feels cold and clammy.  You feel confused.  You have signs of dehydration, such as: ? Dark pee, hardly any pee, or no pee. ? Cracked lips. ? Dry mouth. ? Sunken eyes. ? Sleepiness. ? Weakness. This information is not intended to replace advice given to you by your health care provider. Make sure you discuss any questions you have with your health care provider. Document Released: 09/08/2007 Document Revised: 10/10/2015 Document Reviewed: 11/26/2014 Elsevier Interactive Patient Education  2018 [employer].

## 2018-01-02 ENCOUNTER — Encounter: Payer: Self-pay | Admitting: Family Medicine

## 2018-02-20 ENCOUNTER — Telehealth: Payer: Self-pay | Admitting: Family Medicine

## 2018-02-20 ENCOUNTER — Ambulatory Visit (INDEPENDENT_AMBULATORY_CARE_PROVIDER_SITE_OTHER): Payer: BLUE CROSS/BLUE SHIELD

## 2018-02-20 DIAGNOSIS — J189 Pneumonia, unspecified organism: Secondary | ICD-10-CM

## 2018-02-20 DIAGNOSIS — J181 Lobar pneumonia, unspecified organism: Secondary | ICD-10-CM | POA: Diagnosis not present

## 2018-02-20 DIAGNOSIS — J209 Acute bronchitis, unspecified: Secondary | ICD-10-CM

## 2018-02-20 DIAGNOSIS — R05 Cough: Secondary | ICD-10-CM | POA: Diagnosis not present

## 2018-02-20 NOTE — Telephone Encounter (Signed)
Pt returning call to office after reviewing results on MyChart.Pt given results per notes of Charlotte,NP on 02/20/18. Pt verbalized understanding.Pt states she is still having a dry cough and wheezing which required her to use her inhaler a couple of times. Pt states the cough gets worse at night. Pt denies having SOB, chest tightness, fever or night sweats.  Unable to document in result note due to result note not being routed to [facility].

## 2018-02-21 MED ORDER — PREDNISONE 10 MG (21) PO TBPK: ORAL_TABLET | ORAL | 0 refills | Status: DC

## 2018-02-21 MED ORDER — ALBUTEROL SULFATE 108 (90 BASE) MCG/ACT IN AEPB: 2.0000 | INHALATION_SPRAY | Freq: Four times a day (QID) | RESPIRATORY_TRACT | 0 refills | Status: DC | PRN

## 2018-02-21 NOTE — Telephone Encounter (Signed)
Charlotte please advise, pt is aware of test result and inform us of her symptoms.

## 2018-02-21 NOTE — Telephone Encounter (Signed)
Pt is aware.  

## 2018-03-13 ENCOUNTER — Encounter: Payer: BLUE CROSS/BLUE SHIELD | Admitting: Family Medicine

## 2018-03-13 ENCOUNTER — Encounter

## 2018-03-31 ENCOUNTER — Other Ambulatory Visit: Payer: Self-pay | Admitting: Family Medicine

## 2018-03-31 DIAGNOSIS — I10 Essential (primary) hypertension: Secondary | ICD-10-CM

## 2018-04-03 ENCOUNTER — Encounter: Payer: Self-pay | Admitting: Family Medicine

## 2018-04-05 DIAGNOSIS — R739 Hyperglycemia, unspecified: Secondary | ICD-10-CM | POA: Insufficient documentation

## 2018-04-05 NOTE — Progress Notes (Signed)
Subjective:   Patient ID: Barbara [name], female    DOB: [birth year], 48 y.o.   MRN: [id number]  Barbara [name] is a pleasant 48 y.o. year old female who presents to clinic today with Hypertension (Patient is here today to F/U with HTN. Per her MyChart message she was without her Losartan for 2 weeks.  It eas sent in after she called but now she is confused as to if she should take it or not.  On 3.4.19 OV post ED her BP was regulated on the Losartan. Glucose is trending upward warranting an A1C.)  on 04/06/2018  HPI:  HTN-  She was not sure if she should restart losartan.  She was out of of it for 2 weeks but it has been refilled and she is not sure if she should take it or not.  She has not been taking it.  Has had more headaches the past few days.  No blurred vision, CP or SOB.  No LE edema.   BP Readings from Last 3 Encounters:  04/06/18 (!) 148/84  12/27/17 (!) 142/86  06/06/17 (!) 138/94   Hyperglycemia- glucose trending up on CMET/BMET labs. She does have a history of gestation diabetes. Lab Results  Component Value Date   HGBA1C 6.7 (A) 04/06/2018   HGBA1C 6.7 04/06/2018   Denies increased thirst or urination.   Current Outpatient Medications on File Prior to Visit  Medication Sig Dispense Refill  . Albuterol Sulfate (PROAIR RESPICLICK) 570 (90 Base) MCG/ACT AEPB Inhale 2 puffs into the lungs every 6 (six) hours as needed. 1 each 0  . losartan (COZAAR) 25 MG tablet TAKE 1 TABLET BY MOUTH ONCE DAILY . APPOINTMENT REQUIRED FOR FUTURE REFILLS 30 tablet 0  . [DISCONTINUED] Ferrous Sulfate (IRON) 325 (65 Fe) MG TABS Take 1 tablet by mouth daily.     . butalbital-acetaminophen-caffeine (FIORICET, ESGIC) 50-325-40 MG tablet Take 1 tablet by mouth every 6 (six) hours as needed for headache or migraine. (Patient not taking: Reported on 04/06/2018) 20 tablet 0  . ipratropium (ATROVENT) 0.03 % nasal spray Place 2 sprays into both nostrils 2 (two) times daily. Do not use for more  than 5days. (Patient not taking: Reported on 04/06/2018) 30 mL 0   No current facility-administered medications on file prior to visit.     No Known Allergies  Past Medical History:  Diagnosis Date  . Arthritis   . Cyst, ovary, dermoid, left   . History of gestational diabetes   . History of Helicobacter pylori infection 09/2005  . 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 History of pregnancy induced hypertension   . Hyperlipidemia   . Hypertension    during pregnancy-no medications  . Iron deficiency anemia   . Menorrhagia   . Nephrolithiasis    bilateral tiny nonobstructive calculi per CT 06-21-2016  . Uterine fibroid   . Wears glasses     Past Surgical History:  Procedure Laterality Date  . BREAST EXCISIONAL BIOPSY Left 1982   benign  . 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 CESAREAN SECTION  02-04-1999    vbac 1-06 and 05-19-2005  . ERCP W/ SPHINCTEROTOMY AND BALLOON DILATION  10/11/2005  . LAPAROSCOPIC CHOLECYSTECTOMY  10/04/2005   w/ ERCP  . LAPAROSCOPIC VAGINAL HYSTERECTOMY WITH SALPINGO OOPHORECTOMY Left 09/28/2016   Procedure: LAPAROSCOPIC ASSISTED VAGINAL HYSTERECTOMY WITH SALPINGO OOPHORECTOMY, right salpingectomy;  Surgeon: Dian Queen, MD;  Location: [facility];  Service: Gynecology;  Laterality: Left;  NEED BED FOR OVERNIGHT STAY    Family History  Problem Relation Age of Onset  . Diabetes Mother   . Cancer Mother        breast and lung  . Hypertension Mother     Social History   Socioeconomic History  . Marital status: Married    Spouse name: Not on file  . Number of children: 3  . Years of education: Not on file  . Highest education level: Not on file  Occupational History  . Occupation: math Product manager: [employer]  Social Needs  . Financial resource strain: Not on file  . Food insecurity:    Worry: Not on file    Inability: Not on file  . Transportation needs:     Medical: Not on file    Non-medical: Not on file  Tobacco Use  . Smoking status: Never Smoker  . Smokeless tobacco: Never Used  Substance and Sexual Activity  . Alcohol use: No    Alcohol/week: 0.0 standard drinks  . Drug use: No  . Sexual activity: Yes    Partners: Male    Birth control/protection: Other-see comments    Comment:  husband - vasectomy  Lifestyle  . Physical activity:    Days per week: Not on file    Minutes per session: Not on file  . Stress: Not on file  Relationships  . Social connections:    Talks on phone: Not on file    Gets together: Not on file    Attends religious service: Not on file    Active member of club or organization: Not on file    Attends meetings of clubs or organizations: Not on file    Relationship status: Not on file  . Intimate partner violence:    Fear of current or ex partner: Not on file    Emotionally abused: Not on file    Physically abused: Not on file    Forced sexual activity: Not on file  Other Topics Concern  . Not on file  Social History Narrative   Married to [place], had son [name] 11-00, jayden, and skylar   The PMH, PSH, Social History, Family History, Medications, and allergies have been reviewed in [facility], and have been updated if relevant.    Review of Systems  Constitutional: Negative.   HENT: Negative.   Eyes: Negative.   Respiratory: Negative.   Cardiovascular: Negative.   Gastrointestinal: Negative.   Endocrine: Negative.   Genitourinary: Negative.   Musculoskeletal: Negative.   Neurological: Positive for headaches. Negative for dizziness, tremors, seizures, syncope, facial asymmetry, speech difficulty, light-headedness and numbness.  All other systems reviewed and are negative.      Objective:    BP (!) 148/84 (BP Location: Left Arm, Cuff Size: Normal)   Pulse 88   Temp 98.8 F (37.1 C) (Oral)   Ht '5\' 9"'  (1.753 m)   Wt 228 lb (103.4 kg)   LMP 09/16/2016 (Approximate)   SpO2 97%    BMI 33.67 kg/m   Wt Readings from Last 3 Encounters:  04/06/18 228 lb (103.4 kg)  12/27/17 216 lb 6.4 oz (98.2 kg)  06/06/17 225 lb 12.8 oz (102.4 kg)    Physical Exam Vitals signs and nursing note reviewed.  Constitutional:      General: She is not in acute distress.    Appearance: Normal appearance. She is not ill-appearing.  HENT:     Head:  Normocephalic and atraumatic.     Right Ear: Tympanic membrane normal.     Left Ear: Tympanic membrane normal.     Nose: Nose normal.     Mouth/Throat:     Mouth: Mucous membranes are moist.  Eyes:     Extraocular Movements: Extraocular movements intact.  Neck:     Musculoskeletal: Normal range of motion.  Cardiovascular:     Rate and Rhythm: Normal rate and regular rhythm.     Pulses: Normal pulses.     Heart sounds: Normal heart sounds.  Pulmonary:     Effort: Pulmonary effort is normal.     Breath sounds: Normal breath sounds.  Musculoskeletal: Normal range of motion.  Skin:    General: Skin is warm and dry.  Neurological:     General: No focal deficit present.     Mental Status: She is alert and oriented to person, place, and time. Mental status is at baseline.  Psychiatric:        Mood and Affect: Mood normal.        Behavior: Behavior normal.        Thought Content: Thought content normal.        Judgment: Judgment normal.           Assessment & Plan:   Essential hypertension - Plan: Comp Met (CMET), CBC w/Diff  Hyperglycemia - Plan: POCT HgB A1C, Comp Met (CMET), CBC w/Diff  HYPERTENSION, BENIGN SYSTEMIC  Diabetes mellitus, new onset ([place]) No follow-ups on file.

## 2018-04-06 ENCOUNTER — Encounter: Payer: Self-pay | Admitting: Family Medicine

## 2018-04-06 ENCOUNTER — Ambulatory Visit: Payer: Self-pay | Admitting: Family Medicine

## 2018-04-06 VITALS — BP 148/84 | HR 88 | Temp 98.8°F | Ht 69.0 in | Wt 228.0 lb

## 2018-04-06 DIAGNOSIS — I10 Essential (primary) hypertension: Secondary | ICD-10-CM

## 2018-04-06 DIAGNOSIS — E119 Type 2 diabetes mellitus without complications: Secondary | ICD-10-CM | POA: Insufficient documentation

## 2018-04-06 DIAGNOSIS — R739 Hyperglycemia, unspecified: Secondary | ICD-10-CM

## 2018-04-06 LAB — POCT GLYCOSYLATED HEMOGLOBIN (HGB A1C)
HEMOGLOBIN A1C: 6.7 % (ref 4.0–5.6)
HEMOGLOBIN A1C: 6.7 % — AB (ref 4.0–5.6)

## 2018-04-06 MED ORDER — METFORMIN HCL 500 MG PO TABS: 500.0000 mg | ORAL_TABLET | Freq: Every day | ORAL | 3 refills | Status: DC

## 2018-04-06 NOTE — Assessment & Plan Note (Signed)
New- currently does not have health insurance. Given copy of eat right diet, defer diabetic teaching/nutrition for now. Has CPX in 06/2018.  Start Metformin 500 mg daily with breakfast.

## 2018-04-06 NOTE — Patient Instructions (Addendum)
Great to see you.  Restart Losartan.  Start Metformin 500 mg daily with breakfast.  Please keep your appointment for you physical.

## 2018-04-06 NOTE — Assessment & Plan Note (Signed)
Deteriorated but was well controlled on losartan.  She will restart it.

## 2018-05-07 ENCOUNTER — Other Ambulatory Visit: Payer: Self-pay | Admitting: Family Medicine

## 2018-05-07 DIAGNOSIS — I10 Essential (primary) hypertension: Secondary | ICD-10-CM

## 2018-05-08 ENCOUNTER — Encounter: Payer: Self-pay | Admitting: Family Medicine

## 2018-06-01 NOTE — Progress Notes (Unsigned)
No show

## 2018-06-05 ENCOUNTER — Ambulatory Visit: Payer: Self-pay | Admitting: Family Medicine

## 2018-06-14 ENCOUNTER — Other Ambulatory Visit: Payer: Self-pay | Admitting: Family Medicine

## 2018-06-14 DIAGNOSIS — I10 Essential (primary) hypertension: Secondary | ICD-10-CM

## 2018-06-21 ENCOUNTER — Encounter: Payer: Self-pay | Admitting: Family Medicine

## 2018-06-26 ENCOUNTER — Telehealth: Payer: Self-pay | Admitting: Family Medicine

## 2018-06-26 ENCOUNTER — Encounter: Payer: Self-pay | Admitting: Family Medicine

## 2018-06-26 NOTE — Telephone Encounter (Signed)
Yes please schedule her for either.

## 2018-06-26 NOTE — Telephone Encounter (Signed)
I called the patient about her appointment for Wednesday 06/28/2018 for a CPE and let her know we would need to reschedule. Patient addressed some concerns that would be outside a physical and wanted to know if a virtual visit or telephone visit would be an option. I let her know I would tell her nurse and provider. The number I reached the patient at was her home number but you might need to try both.

## 2018-06-28 ENCOUNTER — Ambulatory Visit: Payer: Self-pay | Admitting: Family Medicine

## 2018-06-28 NOTE — Progress Notes (Addendum)
TELEPHONE ENCOUNTER   Patient verbally agreed to telephone visit and is aware that copayment and coinsurance may apply. Patient was treated using telemedicine according to accepted telemedicine protocols.  Location of the patient: home Location of provider:  [place] Names of all persons participating in the telemedicine service and role in the encounter: Arnette Norris, MD  Vilma Meckel  Subjective:   Chief Complaint  Patient presents with  . Cough    Patient agrees to Webex/In Sept she came in and was seen by CN for a cough and Tx for Pneumonia.  She is concerned that she may have had COVID-19.  She will have an intermittent cough and have some of the wheezing periodically.  Does not feel like her body has been fully well again.  She is not opposed to Prednisone to help her body to get back on track.  Juluis Rainier    I will fax lab orders that are in chart to either [facility] [address] or the [facility] [address] after she calls [facility].     HPI   Interactive audio and video telecommunications were attempted between this provider and patient, however failed, due to patient having technical difficulties OR patient did not have access to video capability.  We continued and completed visit with audio only.   In September, patient saw Wilfred Lacy on 12/27/17.  Note reviewed.  She initially came in for a cough. According to Charlotte's physical exam, she had sinus pressure, cervical adenopathy and wheezes and rales on exam.  She was diagnosed with CAP of right upper lung- given levaquin, tessalon as needed albuterol as needed and Robitussin DM as needed for cough. She also ordered a CXR which showed the following:  CLINICAL DATA:  Nonproductive cough, chills, and low-grade fever for the past 4 days.  EXAM: CHEST - 2 VIEW  COMPARISON:  None.  FINDINGS: The lungs are adequately inflated. There is patchy increased density in the right upper lobe overlying the anterior  aspect of the first rib. The interstitial markings are coarse bilaterally. There is no pleural effusion. The heart and pulmonary vascularity are normal. The mediastinum is normal in width. The bony thorax is unremarkable.  IMPRESSION: Increased lung markings bilaterally worrisome for interstitial pneumonia. In addition there is patchy density overlying the anterior aspect of the right first rib. This may reflect atelectasis or pneumonia or a prominent costochondral junction. Followup PA and lateral chest X-ray is recommended in 3-4 weeks following trial of antibiotic therapy to ensure resolution and exclude underlying malignancy.   Of note, she did have a follow up CXR on 02/20/18 which showed no acute findings.   EXAM: CHEST - 2 VIEW  COMPARISON:  12/27/2017  FINDINGS: UPPER limits normal heart size and mild peribronchial thickening again noted. The opacity overlying the UPPER RIGHT lung is compatible with rib end.  There is no evidence of focal airspace disease, pulmonary edema, suspicious pulmonary nodule/mass, pleural effusion, or pneumothorax.  No acute bony abnormalities are identified.  IMPRESSION: Mild peribronchial thickening without focal pneumonia.  No new abnormalities.  Today, she is concerned that she may have had COVID-19. She is overall feeling much much better but still has an intermittent cough and faitgue.  She will have an intermittent cough and have some of the wheezing periodically. Does not feel like her body has been fully well again. She is not opposed to Prednisone to help her body to get back on track.  Temp 98.4 F (36.9 C) (Oral)  LMP 09/16/2016 (Approximate)   ROS: See pertinent positives and negatives per HPI.  Review of Systems  Constitutional: Positive for fatigue. Negative for fever.  HENT: Negative.   Eyes: Negative.   Respiratory: Positive for cough and wheezing. Negative for apnea, choking, chest tightness, shortness of  breath and stridor.   Cardiovascular: Negative.   Gastrointestinal: Negative.   Endocrine: Negative.   Genitourinary: Negative.   Musculoskeletal: Negative.   Allergic/Immunologic: Negative.   Neurological: Negative.   Hematological: Negative.   Psychiatric/Behavioral: Negative.   All other systems reviewed and are negative.  Patient Active Problem List   Diagnosis Date Noted  . Upper respiratory symptom 06/29/2018  . Cough 06/29/2018  . Diabetes mellitus, new onset ([place]) 04/06/2018  . Hyperglycemia 04/05/2018  . HTN (hypertension) 06/06/2017  . Lymphadenopathy, axillary 04/27/2017  . Frequent headaches 04/14/2017  . S/P laparoscopic assisted vaginal hysterectomy (LAVH) 09/28/2016  . Insomnia 02/18/2014  . History of gestational diabetes 10/26/2012  . HLD (hyperlipidemia) 10/23/2009  . HYPERTENSION, BENIGN SYSTEMIC 06/02/2006   Social History   Tobacco Use  . Smoking status: Never Smoker  . Smokeless tobacco: Never Used  Substance Use Topics  . Alcohol use: No    Alcohol/week: 0.0 standard drinks    Current Outpatient Medications:  .  Albuterol Sulfate (PROAIR RESPICLICK) 283 (90 Base) MCG/ACT AEPB, Inhale 2 puffs into the lungs every 6 (six) hours as needed., Disp: 1 each, Rfl: 0 .  losartan (COZAAR) 25 MG tablet, TAKE 1 TABLET BY MOUTH ONCE DAILY, Disp: 90 tablet, Rfl: 1 .  metFORMIN (GLUCOPHAGE) 500 MG tablet, Take 1 tablet (500 mg total) by mouth daily with breakfast., Disp: 90 tablet, Rfl: 1 .  ipratropium (ATROVENT) 0.03 % nasal spray, Place 2 sprays into both nostrils 2 (two) times daily. Do not use for more than 5days., Disp: 30 mL, Rfl: 0 No Known Allergies  Assessment & Plan:   1. Acute nasopharyngitis   2. HYPERTENSION, BENIGN SYSTEMIC   3. Upper respiratory symptom   4. Cough     No orders of the defined types were placed in this encounter.  Meds ordered this encounter  Medications  . ipratropium (ATROVENT) 0.03 % nasal spray    Sig: Place 2 sprays  into both nostrils 2 (two) times daily. Do not use for more than 5days.    Dispense:  30 mL    Refill:  0  . losartan (COZAAR) 25 MG tablet    Sig: TAKE 1 TABLET BY MOUTH ONCE DAILY    Dispense:  90 tablet    Refill:  1  . metFORMIN (GLUCOPHAGE) 500 MG tablet    Sig: Take 1 tablet (500 mg total) by mouth daily with breakfast.    Dispense:  90 tablet    Refill:  1    Arnette Norris, MD 06/29/2018  Time spent with the patient: 30 minutes, spent in obtaining information about her symptoms, reviewing her previous labs, evaluations, and treatments, counseling her about her condition (please see the discussed topics above), and developing a plan to further investigate it; she had a number of questions which I addressed.   [id number] physician/qualified health professional telephone evaluation 5 to 10 minutes [id number] physician/qualified help functional [place] evaluation for 11 to 20 minutes [id number] physician/qualify he will professional telephone evaluation for 21 to 30 minutes

## 2018-06-28 NOTE — Progress Notes (Deleted)
   Virtual Visit via Video   I connected with Estill Bakes on 06/28/18 at 10:00 AM EDT by a video enabled telemedicine application and verified that I am speaking with the correct person using two identifiers. Location patient: Home Location provider: [facility], Office Persons participating in the virtual visit: Wiliam Ke, MD   I discussed the limitations of evaluation and management by telemedicine and the availability of in person appointments. The patient expressed understanding and agreed to proceed.  Subjective:   HPI: ***  ROS: See pertinent positives and negatives per HPI.  Patient Active Problem List   Diagnosis Date Noted  . Diabetes mellitus, new onset ([place]) 04/06/2018  . Hyperglycemia 04/05/2018  . HTN (hypertension) 06/06/2017  . Lymphadenopathy, axillary 04/27/2017  . Frequent headaches 04/14/2017  . S/P laparoscopic assisted vaginal hysterectomy (LAVH) 09/28/2016  . Insomnia 02/18/2014  . History of gestational diabetes 10/26/2012  . HLD (hyperlipidemia) 10/23/2009  . HYPERTENSION, BENIGN SYSTEMIC 06/02/2006    Social History   Tobacco Use  . Smoking status: Never Smoker  . Smokeless tobacco: Never Used  Substance Use Topics  . Alcohol use: No    Alcohol/week: 0.0 standard drinks    Current Outpatient Medications:  .  Albuterol Sulfate (PROAIR RESPICLICK) 921 (90 Base) MCG/ACT AEPB, Inhale 2 puffs into the lungs every 6 (six) hours as needed., Disp: 1 each, Rfl: 0 .  ipratropium (ATROVENT) 0.03 % nasal spray, Place 2 sprays into both nostrils 2 (two) times daily. Do not use for more than 5days. (Patient not taking: Reported on 04/06/2018), Disp: 30 mL, Rfl: 0 .  losartan (COZAAR) 25 MG tablet, TAKE 1 TABLET BY MOUTH ONCE DAILY (APPT  IS  REQUIRED  FOR  FUTURE  REFILLS), Disp: 30 tablet, Rfl: 0 .  metFORMIN (GLUCOPHAGE) 500 MG tablet, Take 1 tablet (500 mg total) by mouth daily with breakfast., Disp: 30 tablet, Rfl: 3  No Known  Allergies  Objective:   VITALS: Per patient if applicable, see vitals. GENERAL: Alert, appears well and in no acute distress. HEENT: Atraumatic, conjunctiva clear, no obvious abnormalities on inspection of external nose and ears. NECK: Normal movements of the head and neck. CARDIOPULMONARY: No increased WOB. Speaking in clear sentences. I:E ratio WNL.  MS: Moves all visible extremities without noticeable abnormality. PSYCH: Pleasant and cooperative, well-groomed. Speech normal rate and rhythm. Affect is appropriate. Insight and judgement are appropriate. Attention is focused, linear, and appropriate.  NEURO: CN grossly intact. Oriented as arrived to appointment on time with no prompting. Moves both UE equally.  SKIN: No obvious lesions, wounds, erythema, or cyanosis noted on face or hands.  Assessment and Plan:   There are no diagnoses linked to this encounter.  . Reviewed expectations re: course of current medical issues. . Discussed self-management of symptoms. . Outlined signs and symptoms indicating need for more acute intervention. . Patient verbalized understanding and all questions were answered. [facility] Health Maintenance issues including appropriate healthy diet, exercise, and smoking avoidance were discussed with patient. . See orders for this visit as documented in the electronic medical record.  Arnette Norris, MD 06/28/2018

## 2018-06-29 ENCOUNTER — Encounter: Payer: Self-pay | Admitting: Family Medicine

## 2018-06-29 ENCOUNTER — Ambulatory Visit (INDEPENDENT_AMBULATORY_CARE_PROVIDER_SITE_OTHER): Payer: BLUE CROSS/BLUE SHIELD | Admitting: Family Medicine

## 2018-06-29 ENCOUNTER — Other Ambulatory Visit: Payer: Self-pay

## 2018-06-29 VITALS — Temp 98.4°F

## 2018-06-29 DIAGNOSIS — R0989 Other specified symptoms and signs involving the circulatory and respiratory systems: Secondary | ICD-10-CM | POA: Insufficient documentation

## 2018-06-29 DIAGNOSIS — E119 Type 2 diabetes mellitus without complications: Secondary | ICD-10-CM

## 2018-06-29 DIAGNOSIS — R05 Cough: Secondary | ICD-10-CM | POA: Insufficient documentation

## 2018-06-29 DIAGNOSIS — I10 Essential (primary) hypertension: Secondary | ICD-10-CM

## 2018-06-29 DIAGNOSIS — J00 Acute nasopharyngitis [common cold]: Secondary | ICD-10-CM | POA: Diagnosis not present

## 2018-06-29 DIAGNOSIS — E785 Hyperlipidemia, unspecified: Secondary | ICD-10-CM

## 2018-06-29 DIAGNOSIS — R059 Cough, unspecified: Secondary | ICD-10-CM

## 2018-06-29 MED ORDER — LOSARTAN POTASSIUM 25 MG PO TABS: ORAL_TABLET | ORAL | 1 refills | Status: DC

## 2018-06-29 MED ORDER — PREDNISONE 10 MG PO TABS: ORAL_TABLET | ORAL | 0 refills | Status: DC

## 2018-06-29 MED ORDER — IPRATROPIUM BROMIDE 0.03 % NA SOLN: 2.0000 | Freq: Two times a day (BID) | NASAL | 0 refills | Status: DC

## 2018-06-29 MED ORDER — METFORMIN HCL 500 MG PO TABS: 500.0000 mg | ORAL_TABLET | Freq: Every day | ORAL | 1 refills | Status: DC

## 2018-06-29 NOTE — Assessment & Plan Note (Signed)
Discussed patient's questions with there.  Her follow CXR was reassuring and coughs can certainly linger.  She does have a h/o seasonal allergies would could also be playing a roll. Short course course of prednisone sent to pharmacy on file.  Advised to take in the morning and with food. Also, I explained that we have no way of knowing if she had COVID as we don't have an antibody test at this time. Call or send my chart message prn if these symptoms worsen or fail to improve as anticipated. The patient indicates understanding of these issues and agrees with the plan.

## 2018-06-30 DIAGNOSIS — E119 Type 2 diabetes mellitus without complications: Secondary | ICD-10-CM | POA: Diagnosis not present

## 2018-06-30 DIAGNOSIS — I10 Essential (primary) hypertension: Secondary | ICD-10-CM | POA: Diagnosis not present

## 2018-06-30 DIAGNOSIS — E785 Hyperlipidemia, unspecified: Secondary | ICD-10-CM | POA: Diagnosis not present

## 2018-06-30 LAB — CBC AND DIFFERENTIAL
HCT: 36 (ref 36–46)
Hemoglobin: 12 (ref 12.0–16.0)
Neutrophils Absolute: 3634
Platelets: 364 (ref 150–399)
WBC: 5.9

## 2018-06-30 LAB — HEPATIC FUNCTION PANEL
ALT: 17 (ref 7–35)
AST: 17 (ref 13–35)
Alkaline Phosphatase: 76 (ref 25–125)

## 2018-06-30 LAB — BASIC METABOLIC PANEL
BUN: 13 (ref 4–21)
Creatinine: 1 (ref 0.5–1.1)
Glucose: 136
Potassium: 4.3 (ref 3.4–5.3)
Sodium: 139 (ref 137–147)

## 2018-06-30 LAB — TSH: TSH: 1.28 (ref 0.41–5.90)

## 2018-06-30 LAB — LIPID PANEL
Cholesterol: 225 — AB (ref 0–200)
HDL: 67 (ref 35–70)
LDL Cholesterol: 142
LDl/HDL Ratio: 3.4
Triglycerides: 72 (ref 40–160)

## 2018-06-30 LAB — HEMOGLOBIN A1C: Hemoglobin A1C: 6.9

## 2018-07-05 ENCOUNTER — Encounter: Payer: Self-pay | Admitting: Family Medicine

## 2018-07-05 NOTE — Progress Notes (Signed)
[facility]/thx dmf

## 2018-07-10 ENCOUNTER — Encounter: Payer: Self-pay | Admitting: Family Medicine

## 2018-07-11 ENCOUNTER — Encounter: Payer: Self-pay | Admitting: Family Medicine

## 2018-07-12 ENCOUNTER — Encounter: Payer: Self-pay | Admitting: Family Medicine

## 2018-09-22 ENCOUNTER — Telehealth: Payer: Self-pay | Admitting: Behavioral Health

## 2018-09-22 DIAGNOSIS — Z01419 Encounter for gynecological examination (general) (routine) without abnormal findings: Secondary | ICD-10-CM | POA: Insufficient documentation

## 2018-09-22 NOTE — Progress Notes (Addendum)
Subjective:   Patient ID: Barbara [name], female    DOB: 1970/09/13, 48 y.o.   MRN: [id number]  Barbara [name] is a pleasant 48 y.o. year old female who presents to clinic today with Annual Exam (Patient screened at vehicle. She is here today for a CPE with PAP.  She is currently fasting.  She is due for a 3D Mammogram at [facility] and will call if order. Declines PNV-23. Still having to use her inhaler with wheezing from 3.26.20 illness.  States that throat feels like it is closed more than it should be.  Will schedule eye exam.) and Follow-up  on 09/25/2018  HPI:  GYN exam-   Last pap smear done by me on 01/20/16, which was normal.  s/p hysterectomy in 09/2016-  LAPAROSCOPIC VAGINAL HYSTERECTOMY WITH SALPINGO OOPHORECTOMY Left 09/28/2016  Not sure if she has a cervix.  Due for mammogram.  Last mammogram was on 05/18/17.  Just quit her job which was making her unhappy. She wants to write children books.  DM- has been well controlled on  Metformin 500 mg daily with breakfast.  Denies any episodes of hypoglycemia.  Lab Results  Component Value Date   HGBA1C 6.9 06/30/2018   On ARB. Due for eye exam.  Not on statin.  Lab Results  Component Value Date   CHOL 225 (A) 06/30/2018   HDL 67 06/30/2018   LDLCALC 142 06/30/2018   LDLDIRECT 145.1 10/26/2012   TRIG 72 06/30/2018   CHOLHDL 3 02/21/2017   Cough- mainly at night when she lay down.  Does fee like her throat is smaller when that happens. Food and water never get stuck.  Has been ongoing for months.  Only thing she has tried for it is albuterol inhaler.  Current Outpatient Medications on File Prior to Visit  Medication Sig Dispense Refill  . Albuterol Sulfate (PROAIR RESPICLICK) 102 (90 Base) MCG/ACT AEPB Inhale 2 puffs into the lungs every 6 (six) hours as needed. 1 each 0  . ipratropium (ATROVENT) 0.03 % nasal spray Place 2 sprays into both nostrils 2 (two) times daily. Do not use for more than 5days. 30 mL  0  . losartan (COZAAR) 25 MG tablet TAKE 1 TABLET BY MOUTH ONCE DAILY 90 tablet 1  . metFORMIN (GLUCOPHAGE) 500 MG tablet Take 1 tablet (500 mg total) by mouth daily with breakfast. 90 tablet 1   No current facility-administered medications on file prior to visit.     No Known Allergies  Past Medical History:  Diagnosis Date  . Arthritis   . Cyst, ovary, dermoid, left   . History of gestational diabetes   . History of Helicobacter pylori infection 09/2005  . 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 History of pregnancy induced hypertension   . Hyperlipidemia   . Hypertension    during pregnancy-no medications  . Iron deficiency anemia   . Menorrhagia   . Nephrolithiasis    bilateral tiny nonobstructive calculi per CT 06-21-2016  . Uterine fibroid   . Wears glasses     Past Surgical History:  Procedure Laterality Date  . BREAST EXCISIONAL BIOPSY Left 1982   benign  . 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 CESAREAN SECTION  02-04-1999    vbac 1-06 and 05-19-2005  . ERCP W/ SPHINCTEROTOMY AND BALLOON DILATION  10/11/2005  . LAPAROSCOPIC CHOLECYSTECTOMY  10/04/2005   w/ ERCP  . LAPAROSCOPIC VAGINAL HYSTERECTOMY WITH SALPINGO  OOPHORECTOMY Left 09/28/2016   Procedure: LAPAROSCOPIC ASSISTED VAGINAL HYSTERECTOMY WITH SALPINGO OOPHORECTOMY, right salpingectomy;  Surgeon: Dian Queen, MD;  Location: [facility];  Service: Gynecology;  Laterality: Left;  NEED BED FOR OVERNIGHT STAY    Family History  Problem Relation Age of Onset  . Diabetes Mother   . Cancer Mother        breast and lung  . Hypertension Mother     Social History   Socioeconomic History  . Marital status: Married    Spouse name: Not on file  . Number of children: 3  . Years of education: Not on file  . Highest education level: Not on file  Occupational History  . Occupation: math Product manager: [employer]  Social Needs  . Financial  resource strain: Not on file  . Food insecurity    Worry: Not on file    Inability: Not on file  . Transportation needs    Medical: Not on file    Non-medical: Not on file  Tobacco Use  . Smoking status: Never Smoker  . Smokeless tobacco: Never Used  Substance and Sexual Activity  . Alcohol use: No    Alcohol/week: 0.0 standard drinks  . Drug use: No  . Sexual activity: Yes    Partners: Male    Birth control/protection: Other-see comments    Comment:  husband - vasectomy  Lifestyle  . Physical activity    Days per week: Not on file    Minutes per session: Not on file  . Stress: Not on file  Relationships  . Social Herbalist on phone: Not on file    Gets together: Not on file    Attends religious service: Not on file    Active member of club or organization: Not on file    Attends meetings of clubs or organizations: Not on file    Relationship status: Not on file  . Intimate partner violence    Fear of current or ex partner: Not on file    Emotionally abused: Not on file    Physically abused: Not on file    Forced sexual activity: Not on file  Other Topics Concern  . Not on file  Social History Narrative   Married to [place], had son [name] 11-00, jayden, and skylar   The PMH, PSH, Social History, Family History, Medications, and allergies have been reviewed in [facility], and have been updated if relevant.   Review of Systems  Constitutional: Negative.   HENT: Positive for sore throat. Negative for ear pain, facial swelling, hearing loss, mouth sores, postnasal drip, rhinorrhea, sinus pressure and sinus pain.   Eyes: Negative.   Respiratory: Positive for cough.   Cardiovascular: Negative.   Gastrointestinal: Negative.   Endocrine: Negative.   Genitourinary: Negative.   Musculoskeletal: Negative.   Allergic/Immunologic: Negative.   Neurological: Negative.   Hematological: Negative.   Psychiatric/Behavioral: Negative.   All other systems  reviewed and are negative.      Objective:    BP 134/90 (BP Location: Left Arm, Patient Position: Sitting, Cuff Size: Normal)   Pulse 79   Temp 98.9 F (37.2 C) (Oral)   Ht 5\' 10"  (1.778 m)   Wt 235 lb 12.8 oz (107 kg)   LMP 09/16/2016 (Approximate)   SpO2 97%   BMI 33.83 kg/m    Physical Exam   General:  Well-developed,well-nourished,in no acute distress; alert,appropriate and cooperative throughout examination Head:  normocephalic  and atraumatic.   Eyes:  vision grossly intact, PERRL Ears:  R ear normal and L ear normal externally, TMs clear bilaterally Nose:  no external deformity.   Mouth:  good dentition.   Neck:  No deformities, masses, or tenderness noted. Breasts:  No mass, nodules, thickening, tenderness, bulging, retraction, inflamation, nipple discharge or skin changes noted.   Lungs:  Normal respiratory effort, chest expands symmetrically. Lungs are clear to auscultation, no crackles or wheezes. Heart:  Normal rate and regular rhythm. S1 and S2 normal without gallop, murmur, click, rub or other extra sounds. Abdomen:  Bowel sounds positive,abdomen soft and non-tender without masses, organomegaly or hernias noted. Rectal:  no external abnormalities.   Genitalia:  Pelvic Exam:        External: normal female genitalia without lesions or masses        Vagina: normal without lesions or masses        Cervix: not visulized by will send off pap today.  If no endoscerival cells, no further paps smears warranted, continue with BME.        Adnexa: normal bimanual exam without masses or fullness        Uterus: normal by palpation        Pap smear: performed Msk:  No deformity or scoliosis noted of thoracic or lumbar spine.   Extremities:  No clubbing, cyanosis, edema, or deformity noted with normal full range of motion of all joints.   Neurologic:  alert & oriented X3 and gait normal.   Skin:  Intact without suspicious lesions or rashes Cervical Nodes:  No lymphadenopathy  noted Axillary Nodes:  No palpable lymphadenopathy Psych:  Cognition and judgment appear intact. Alert and cooperative with normal attention span and concentration. No apparent delusions, illusions, hallucinations       Assessment & Plan:   Screening for breast cancer - Plan: MM Digital Screening,   Encounter for gynecological examination without abnormal finding - Plan:  No follow-ups on file.

## 2018-09-22 NOTE — Telephone Encounter (Signed)
Unable to reach patient at the time of call. Left detailed voicemail with instructions regarding the appointment on Monday, 09/25/18.

## 2018-09-25 ENCOUNTER — Ambulatory Visit (INDEPENDENT_AMBULATORY_CARE_PROVIDER_SITE_OTHER): Payer: BC Managed Care – PPO | Admitting: Family Medicine

## 2018-09-25 ENCOUNTER — Other Ambulatory Visit (HOSPITAL_COMMUNITY)
Admission: RE | Admit: 2018-09-25 | Discharge: 2018-09-25 | Disposition: A | Discharge status: Home / Self Care | Payer: BC Managed Care – PPO | Source: Ambulatory Visit | Attending: Family Medicine | Admitting: Family Medicine

## 2018-09-25 ENCOUNTER — Encounter: Payer: Self-pay | Admitting: Family Medicine

## 2018-09-25 VITALS — BP 134/90 | HR 79 | Temp 98.9°F | Ht 70.0 in | Wt 235.8 lb

## 2018-09-25 DIAGNOSIS — I10 Essential (primary) hypertension: Secondary | ICD-10-CM

## 2018-09-25 DIAGNOSIS — Z01419 Encounter for gynecological examination (general) (routine) without abnormal findings: Secondary | ICD-10-CM

## 2018-09-25 DIAGNOSIS — Z1239 Encounter for other screening for malignant neoplasm of breast: Secondary | ICD-10-CM

## 2018-09-25 DIAGNOSIS — R05 Cough: Secondary | ICD-10-CM | POA: Diagnosis not present

## 2018-09-25 DIAGNOSIS — Z0001 Encounter for general adult medical examination with abnormal findings: Secondary | ICD-10-CM | POA: Diagnosis not present

## 2018-09-25 DIAGNOSIS — E119 Type 2 diabetes mellitus without complications: Secondary | ICD-10-CM

## 2018-09-25 DIAGNOSIS — K219 Gastro-esophageal reflux disease without esophagitis: Secondary | ICD-10-CM | POA: Insufficient documentation

## 2018-09-25 DIAGNOSIS — R059 Cough, unspecified: Secondary | ICD-10-CM | POA: Insufficient documentation

## 2018-09-25 DIAGNOSIS — E785 Hyperlipidemia, unspecified: Secondary | ICD-10-CM | POA: Diagnosis not present

## 2018-09-25 LAB — COMPREHENSIVE METABOLIC PANEL
ALT: 17 U/L (ref 0–35)
AST: 15 U/L (ref 0–37)
Albumin: 4.1 g/dL (ref 3.5–5.2)
Alkaline Phosphatase: 70 U/L (ref 39–117)
BUN: 11 mg/dL (ref 6–23)
CO2: 28 mEq/L (ref 19–32)
Calcium: 8.9 mg/dL (ref 8.4–10.5)
Chloride: 102 mEq/L (ref 96–112)
Creatinine, Ser: 0.85 mg/dL (ref 0.40–1.20)
GFR: 87.06 mL/min (ref >60.00)
Glucose, Bld: 133 mg/dL — ABNORMAL HIGH (ref 70–99)
Potassium: 3.8 mEq/L (ref 3.5–5.1)
Sodium: 136 mEq/L (ref 135–145)
Total Bilirubin: 0.5 mg/dL (ref 0.2–1.2)
Total Protein: 7.1 g/dL (ref 6.0–8.3)

## 2018-09-25 LAB — CBC
HCT: 37 % (ref 36.0–46.0)
Hemoglobin: 12.4 g/dL (ref 12.0–15.0)
MCHC: 33.4 g/dL (ref 30.0–36.0)
MCV: 90.9 fl (ref 78.0–100.0)
Platelets: 344 10*3/uL (ref 150.0–400.0)
RBC: 4.07 Mil/uL (ref 3.87–5.11)
RDW: 14.2 % (ref 11.5–15.5)
WBC: 5.2 10*3/uL (ref 4.0–10.5)

## 2018-09-25 LAB — LIPID PANEL
Cholesterol: 214 mg/dL — ABNORMAL HIGH (ref 0–200)
HDL: 64.6 mg/dL (ref >39.00)
LDL Cholesterol: 134 mg/dL — ABNORMAL HIGH (ref 0–99)
NonHDL: 149.22
Total CHOL/HDL Ratio: 3
Triglycerides: 78 mg/dL (ref 0.0–149.0)
VLDL: 15.6 mg/dL (ref 0.0–40.0)

## 2018-09-25 LAB — HEMOGLOBIN A1C: Hgb A1c MFr Bld: 7 % — ABNORMAL HIGH (ref 4.6–6.5)

## 2018-09-25 LAB — TSH: TSH: 1.71 u[IU]/mL (ref 0.35–4.50)

## 2018-09-25 MED ORDER — OMEPRAZOLE 40 MG PO CPDR: 40.0000 mg | DELAYED_RELEASE_CAPSULE | Freq: Every day | ORAL | 3 refills | Status: DC

## 2018-09-25 NOTE — Assessment & Plan Note (Signed)
Cough, seems most consistent with GERD. eRx sent for ompeprazole 40 mg daliy. She will update me in 2 weeks.

## 2018-09-25 NOTE — Patient Instructions (Addendum)
Great to see you!!!  Please give my love to your family. I will call you with your lab results from today and you can view them online.   Try omeprazole 40 mg every evening for 2 weeks and call me.  Please call the breast center at [phone] to schedule your mammogram.

## 2018-09-25 NOTE — Assessment & Plan Note (Signed)
Not at goal for a diabetic.  Due for labs today.

## 2018-09-25 NOTE — Assessment & Plan Note (Signed)
At goal for a diabetic.  No changes made to rx today.

## 2018-09-25 NOTE — Assessment & Plan Note (Addendum)
Has been doing well on lowe dose metformin.  May need to start statin  CHeck a1c and lipid panel today.  Denies prevnar today.  Orders Placed This Encounter  Procedures  . MM Digital Screening  . Lipid panel  . Comprehensive metabolic panel  . CBC  . TSH  . Hemoglobin A1c     The 10-year ASCVD risk score Mikey Bussing DC Jr., et al., 2013) is: 5.8%*   Values used to calculate the score:     Age: 48 years     Sex: Female     Is Non-Hispanic African American: Yes     Diabetic: Yes     Tobacco smoker: No     Systolic Blood Pressure: 169 mmHg     Is BP treated: Yes     HDL Cholesterol: 67 mg/dL*     Total Cholesterol: 225 mg/dL*     * - Cholesterol units were assumed for this score calculation

## 2018-09-25 NOTE — Assessment & Plan Note (Signed)
Cervix: not visulized by will send off pap today.  If no endoscerival cells on pap smear. no further paps smears warranted, continue with BME.

## 2018-09-26 LAB — CYTOLOGY - PAP
Chlamydia: NEGATIVE
Diagnosis: NEGATIVE
HPV: NOT DETECTED
Neisseria Gonorrhea: NEGATIVE
Trichomonas: NEGATIVE

## 2018-09-27 ENCOUNTER — Encounter: Payer: Self-pay | Admitting: Family Medicine

## 2018-09-28 LAB — CERVICOVAGINAL ANCILLARY ONLY: Herpes: NEGATIVE

## 2018-10-02 NOTE — Addendum Note (Signed)
Addended by: Lucille Passy on: 10/02/2018 08:01 AM   Modules accepted: Level of Service

## 2018-10-05 ENCOUNTER — Encounter: Payer: Self-pay | Admitting: Family Medicine

## 2018-11-15 ENCOUNTER — Encounter: Payer: Self-pay | Admitting: Family Medicine

## 2019-01-11 ENCOUNTER — Other Ambulatory Visit: Payer: Self-pay

## 2019-01-11 ENCOUNTER — Ambulatory Visit
Admission: RE | Admit: 2019-01-11 | Discharge: 2019-01-11 | Disposition: A | Discharge status: Home / Self Care | Payer: BC Managed Care – PPO | Source: Ambulatory Visit | Attending: Family Medicine | Admitting: Family Medicine

## 2019-01-11 DIAGNOSIS — Z1239 Encounter for other screening for malignant neoplasm of breast: Secondary | ICD-10-CM

## 2019-01-11 DIAGNOSIS — Z1231 Encounter for screening mammogram for malignant neoplasm of breast: Secondary | ICD-10-CM | POA: Diagnosis not present

## 2019-04-03 LAB — HM DIABETES EYE EXAM

## 2019-04-18 ENCOUNTER — Encounter: Payer: Self-pay | Admitting: Family Medicine

## 2019-04-19 ENCOUNTER — Ambulatory Visit: Payer: BC Managed Care – PPO | Attending: Internal Medicine

## 2019-04-19 DIAGNOSIS — Z20822 Contact with and (suspected) exposure to covid-19: Secondary | ICD-10-CM

## 2019-04-20 LAB — NOVEL CORONAVIRUS, NAA: SARS-CoV-2, NAA: NOT DETECTED

## 2019-06-18 ENCOUNTER — Ambulatory Visit: Payer: BC Managed Care – PPO | Attending: Internal Medicine

## 2019-06-18 DIAGNOSIS — Z20822 Contact with and (suspected) exposure to covid-19: Secondary | ICD-10-CM

## 2019-06-19 LAB — NOVEL CORONAVIRUS, NAA: SARS-CoV-2, NAA: NOT DETECTED

## 2019-08-29 ENCOUNTER — Telehealth: Payer: Self-pay

## 2019-08-29 NOTE — Telephone Encounter (Signed)
Looks like she has been out of medication. She needs to schedule sooner appt for eval of HTN.

## 2019-08-29 NOTE — Telephone Encounter (Signed)
Barbara [name] please advise. Pt asking for refill:  Losartan 25mg  90 tab   1-refill Last filled-06/29/2018 Last OV-09/25/2018  Pt does have a appointment scheduled 09/26/2019 with you.

## 2019-09-06 NOTE — Telephone Encounter (Signed)
Left a message for pt to call us back

## 2019-09-13 NOTE — Telephone Encounter (Signed)
Pt notified but she said she had enough Losartan to cover her till her appointment 6/23.

## 2019-09-25 ENCOUNTER — Encounter: Payer: Self-pay | Admitting: Nurse Practitioner

## 2019-09-26 ENCOUNTER — Telehealth: Payer: Self-pay | Admitting: Nurse Practitioner

## 2019-09-26 ENCOUNTER — Encounter: Payer: Self-pay | Admitting: Nurse Practitioner

## 2019-09-26 ENCOUNTER — Other Ambulatory Visit (HOSPITAL_COMMUNITY)
Admission: RE | Admit: 2019-09-26 | Discharge: 2019-09-26 | Disposition: A | Discharge status: Home / Self Care | Payer: BC Managed Care – PPO | Source: Ambulatory Visit | Attending: Nurse Practitioner | Admitting: Nurse Practitioner

## 2019-09-26 ENCOUNTER — Other Ambulatory Visit: Payer: Self-pay

## 2019-09-26 ENCOUNTER — Ambulatory Visit (INDEPENDENT_AMBULATORY_CARE_PROVIDER_SITE_OTHER): Payer: BC Managed Care – PPO | Admitting: Nurse Practitioner

## 2019-09-26 VITALS — BP 138/82 | HR 71 | Temp 96.4°F | Ht 70.0 in | Wt 231.2 lb

## 2019-09-26 DIAGNOSIS — Z0000 Encounter for general adult medical examination without abnormal findings: Secondary | ICD-10-CM | POA: Insufficient documentation

## 2019-09-26 DIAGNOSIS — Z124 Encounter for screening for malignant neoplasm of cervix: Secondary | ICD-10-CM | POA: Insufficient documentation

## 2019-09-26 DIAGNOSIS — Z113 Encounter for screening for infections with a predominantly sexual mode of transmission: Secondary | ICD-10-CM | POA: Insufficient documentation

## 2019-09-26 DIAGNOSIS — E782 Mixed hyperlipidemia: Secondary | ICD-10-CM

## 2019-09-26 DIAGNOSIS — E1165 Type 2 diabetes mellitus with hyperglycemia: Secondary | ICD-10-CM

## 2019-09-26 DIAGNOSIS — I10 Essential (primary) hypertension: Secondary | ICD-10-CM

## 2019-09-26 LAB — COMPREHENSIVE METABOLIC PANEL
ALT: 18 U/L (ref 0–35)
AST: 14 U/L (ref 0–37)
Albumin: 4.2 g/dL (ref 3.5–5.2)
Alkaline Phosphatase: 77 U/L (ref 39–117)
BUN: 12 mg/dL (ref 6–23)
CO2: 28 mEq/L (ref 19–32)
Calcium: 8.7 mg/dL (ref 8.4–10.5)
Chloride: 104 mEq/L (ref 96–112)
Creatinine, Ser: 0.8 mg/dL (ref 0.40–1.20)
GFR: 92.96 mL/min (ref >60.00)
Glucose, Bld: 169 mg/dL — ABNORMAL HIGH (ref 70–99)
Potassium: 3.5 mEq/L (ref 3.5–5.1)
Sodium: 137 mEq/L (ref 135–145)
Total Bilirubin: 0.4 mg/dL (ref 0.2–1.2)
Total Protein: 6.7 g/dL (ref 6.0–8.3)

## 2019-09-26 LAB — LIPID PANEL
Cholesterol: 217 mg/dL — ABNORMAL HIGH (ref 0–200)
HDL: 56.1 mg/dL (ref >39.00)
LDL Cholesterol: 148 mg/dL — ABNORMAL HIGH (ref 0–99)
NonHDL: 160.68
Total CHOL/HDL Ratio: 4
Triglycerides: 65 mg/dL (ref 0.0–149.0)
VLDL: 13 mg/dL (ref 0.0–40.0)

## 2019-09-26 LAB — CBC
HCT: 35 % — ABNORMAL LOW (ref 36.0–46.0)
Hemoglobin: 11.9 g/dL — ABNORMAL LOW (ref 12.0–15.0)
MCHC: 33.9 g/dL (ref 30.0–36.0)
MCV: 89.8 fl (ref 78.0–100.0)
Platelets: 312 10*3/uL (ref 150.0–400.0)
RBC: 3.9 Mil/uL (ref 3.87–5.11)
RDW: 14.3 % (ref 11.5–15.5)
WBC: 5 10*3/uL (ref 4.0–10.5)

## 2019-09-26 LAB — MICROALBUMIN / CREATININE URINE RATIO
Creatinine,U: 188.6 mg/dL
Microalb Creat Ratio: 1.7 mg/g (ref 0.0–30.0)
Microalb, Ur: 3.2 mg/dL — ABNORMAL HIGH (ref 0.0–1.9)

## 2019-09-26 LAB — TSH: TSH: 1.31 u[IU]/mL (ref 0.35–4.50)

## 2019-09-26 LAB — HEMOGLOBIN A1C: Hgb A1c MFr Bld: 7.7 % — ABNORMAL HIGH (ref 4.6–6.5)

## 2019-09-26 NOTE — Telephone Encounter (Signed)
Patient dropped off form from [facility] to be completed by her provider. Placed the form in providers folder in front office. Please call patient at [phone] once it has been completed.  Thank you, Burley Saver

## 2019-09-26 NOTE — Telephone Encounter (Signed)
Charlotte please advise.  Forms from employer was received for physical an on your desk for signature pending her labs.

## 2019-09-26 NOTE — Patient Instructions (Addendum)
Stable HgbA1c at 7.7. continue metformin Normal TSH, CBC, wet prep, and CMP.  normal PAP Abnormal lipid panel. Maintain DASH diet and daily exercise. You are due to mammogram 01/2020.  Preventive Care 83-47 Years Old, Female Preventive care refers to visits with your health care provider and lifestyle choices that can promote health and wellness. This includes:  A yearly physical exam. This may also be called an annual well check.  Regular dental visits and eye exams.  Immunizations.  Screening for certain conditions.  Healthy lifestyle choices, such as eating a healthy diet, getting regular exercise, not using drugs or products that contain nicotine and tobacco, and limiting alcohol use. What can I expect for my preventive care visit? Physical exam Your health care provider will check your:  Height and weight. This may be used to calculate body mass index (BMI), which tells if you are at a healthy weight.  Heart rate and blood pressure.  Skin for abnormal spots. Counseling Your health care provider may ask you questions about your:  Alcohol, tobacco, and drug use.  Emotional well-being.  Home and relationship well-being.  Sexual activity.  Eating habits.  Work and work Statistician.  Method of birth control.  Menstrual cycle.  Pregnancy history. What immunizations do I need?  Influenza (flu) vaccine  This is recommended every year. Tetanus, diphtheria, and pertussis (Tdap) vaccine  You may need a Td booster every 10 years. Varicella (chickenpox) vaccine  You may need this if you have not been vaccinated. Zoster (shingles) vaccine  You may need this after age 41. Measles, mumps, and rubella (MMR) vaccine  You may need at least one dose of MMR if you were born in 1957 or later. You may also need a second dose. Pneumococcal conjugate (PCV13) vaccine  You may need this if you have certain conditions and were not previously vaccinated. Pneumococcal  polysaccharide (PPSV23) vaccine  You may need one or two doses if you smoke cigarettes or if you have certain conditions. Meningococcal conjugate (MenACWY) vaccine  You may need this if you have certain conditions. Hepatitis A vaccine  You may need this if you have certain conditions or if you travel or work in places where you may be exposed to hepatitis A. Hepatitis B vaccine  You may need this if you have certain conditions or if you travel or work in places where you may be exposed to hepatitis B. Haemophilus influenzae type b (Hib) vaccine  You may need this if you have certain conditions. Human papillomavirus (HPV) vaccine  If recommended by your health care provider, you may need three doses over 6 months. You may receive vaccines as individual doses or as more than one vaccine together in one shot (combination vaccines). Talk with your health care provider about the risks and benefits of combination vaccines. What tests do I need? Blood tests  Lipid and cholesterol levels. These may be checked every 5 years, or more frequently if you are over 5 years old.  Hepatitis C test.  Hepatitis B test. Screening  Lung cancer screening. You may have this screening every year starting at age 75 if you have a 30-pack-year history of smoking and currently smoke or have quit within the past 15 years.  Colorectal cancer screening. All adults should have this screening starting at age 15 and continuing until age 79. Your health care provider may recommend screening at age 87 if you are at increased risk. You will have tests every 1-10 years, depending on your  results and the type of screening test.  Diabetes screening. This is done by checking your blood sugar (glucose) after you have not eaten for a while (fasting). You may have this done every 1-3 years.  Mammogram. This may be done every 1-2 years. Talk with your health care provider about when you should start having regular  mammograms. This may depend on whether you have a family history of breast cancer.  BRCA-related cancer screening. This may be done if you have a family history of breast, ovarian, tubal, or peritoneal cancers.  Pelvic exam and Pap test. This may be done every 3 years starting at age 40. Starting at age 56, this may be done every 5 years if you have a Pap test in combination with an HPV test. Other tests  Sexually transmitted disease (STD) testing.  Bone density scan. This is done to screen for osteoporosis. You may have this scan if you are at high risk for osteoporosis. Follow these instructions at home: Eating and drinking  Eat a diet that includes fresh fruits and vegetables, whole grains, lean protein, and low-fat dairy.  Take vitamin and mineral supplements as recommended by your health care provider.  Do not drink alcohol if: ? Your health care provider tells you not to drink. ? You are pregnant, may be pregnant, or are planning to become pregnant.  If you drink alcohol: ? Limit how much you have to 0-1 drink a day. ? Be aware of how much alcohol is in your drink. In the [place] one drink equals one 12 oz bottle of beer (355 mL), one 5 oz glass of wine (148 mL), or one 1 oz glass of hard liquor (44 mL). Lifestyle  Take daily care of your teeth and gums.  Stay active. Exercise for at least 30 minutes on 5 or more days each week.  Do not use any products that contain nicotine or tobacco, such as cigarettes, e-cigarettes, and chewing tobacco. If you need help quitting, ask your health care provider.  If you are sexually active, practice safe sex. Use a condom or other form of birth control (contraception) in order to prevent pregnancy and STIs (sexually transmitted infections).  If told by your health care provider, take low-dose aspirin daily starting at age 91. What's next?  Visit your health care provider once a year for a well check visit.  Ask your health care provider  how often you should have your eyes and teeth checked.  Stay up to date on all vaccines. This information is not intended to replace advice given to you by your health care provider. Make sure you discuss any questions you have with your health care provider. Document Revised: 12/01/2017 Document Reviewed: 12/01/2017 Elsevier Patient Education  2020 [employer].

## 2019-09-27 LAB — CERVICOVAGINAL ANCILLARY ONLY
Bacterial Vaginitis (gardnerella): NEGATIVE
Candida Glabrata: NEGATIVE
Candida Vaginitis: NEGATIVE
Chlamydia: NEGATIVE
Comment: NEGATIVE
Comment: NEGATIVE
Comment: NEGATIVE
Comment: NEGATIVE
Comment: NEGATIVE
Comment: NORMAL
Neisseria Gonorrhea: NEGATIVE
Trichomonas: NEGATIVE

## 2019-09-27 LAB — HIV ANTIBODY (ROUTINE TESTING W REFLEX): HIV 1&2 Ab, 4th Generation: NONREACTIVE

## 2019-09-27 LAB — CYTOLOGY - PAP
Comment: NEGATIVE
Diagnosis: NEGATIVE
High risk HPV: NEGATIVE

## 2019-09-27 LAB — HEPATITIS C ANTIBODY
Hepatitis C Ab: NONREACTIVE
SIGNAL TO CUT-OFF: 0.01 (ref <1.00)

## 2019-09-27 LAB — RPR: RPR Ser Ql: NONREACTIVE

## 2019-09-28 ENCOUNTER — Encounter: Payer: Self-pay | Admitting: Nurse Practitioner

## 2019-09-28 MED ORDER — METFORMIN HCL 500 MG PO TABS: 500.0000 mg | ORAL_TABLET | Freq: Every day | ORAL | 3 refills | Status: DC

## 2019-09-28 MED ORDER — LOSARTAN POTASSIUM 25 MG PO TABS: ORAL_TABLET | ORAL | 3 refills | Status: DC

## 2019-09-28 NOTE — Assessment & Plan Note (Signed)
BP at goal with losartan BP Readings from Last 3 Encounters:  09/26/19 138/82  09/25/18 134/90  04/06/18 (!) 148/84

## 2019-09-28 NOTE — Progress Notes (Signed)
Subjective:    Patient ID: Barbara [name], female    DOB: [birth year], 49 y.o.   MRN: [id number]  Patient presents today for complete physical  HPI DM: controlled with metformin Negative neuropathy and nephropathy Up to date with eye exam LDL not at goal ASCVD risk of 9.8% BP at goal with losaratn No tobacco use  Sexual History (orientation,birth control, marital status, STD):married, s/p hysterectomy, she request for continuous cervical cancer screen?, requesting for STD screen, uptodate with mammogram  Depression/Suicide: Depression screen [facility] 2/9 09/26/2019 06/29/2018 02/23/2017 06/21/2015 06/19/2015 06/17/2015 06/15/2015  Decreased Interest 0 0 0 0 0 0 0  Down, Depressed, Hopeless 0 0 0 0 0 0 0  PHQ - 2 Score 0 0 0 0 0 0 0   Vision:up to date  Dental:up to date  Immunizations: (TDAP, Hep C screen, Pneumovax, Influenza, zoster)  Health Maintenance  Topic Date Due  . Pneumococcal vaccine  Never done  . Complete foot exam   09/25/2019  . Flu Shot  11/04/2019  . Hemoglobin A1C  03/27/2020  . Eye exam for diabetics  04/02/2020  . Pap Smear  09/26/2022  . Tetanus Vaccine  04/15/2027  . COVID-19 Vaccine  Completed  .  Hepatitis C: One time screening is recommended by Center for Disease Control  (CDC) for  adults born from [birth year] through 1963.   Completed  . HIV Screening  Completed   Diet:regular  Weight:  Wt Readings from Last 3 Encounters:  09/26/19 231 lb 3.2 oz (104.9 kg)  09/25/18 235 lb 12.8 oz (107 kg)  04/06/18 228 lb (103.4 kg)    Fall Risk: Fall Risk  09/26/2019 06/29/2018 06/15/2015 12/24/2014  Falls in the past year? 0 0 No No  Number falls in past yr: 0 - - -  Injury with Fall? 0 - - -  Follow up - Falls evaluation completed - -   Advanced Directive: Advanced Directives 05/21/2017  Does Patient Have a Medical Advance Directive? No  Type of Advance Directive -  Does patient want to make changes to medical advance directive? -  Copy of [place] in Chart? -  Would patient like information on creating a medical advance directive? No - Patient declined     Medications and allergies reviewed with patient and updated if appropriate.  Patient Active Problem List   Diagnosis Date Noted  . Cough 09/25/2018  . GERD (gastroesophageal reflux disease) 09/25/2018  . Visit for routine gyn exam 09/22/2018  . DM (diabetes mellitus) ([place]) 04/06/2018  . HTN (hypertension) 06/06/2017  . Frequent headaches 04/14/2017  . S/P laparoscopic assisted vaginal hysterectomy (LAVH) 09/28/2016  . Insomnia 02/18/2014  . History of gestational diabetes 10/26/2012  . HLD (hyperlipidemia) 10/23/2009  . HYPERTENSION, BENIGN SYSTEMIC 06/02/2006    Current Outpatient Medications on File Prior to Visit  Medication Sig Dispense Refill  . Albuterol Sulfate (PROAIR RESPICLICK) 128 (90 Base) MCG/ACT AEPB Inhale 2 puffs into the lungs every 6 (six) hours as needed. 1 each 0   No current facility-administered medications on file prior to visit.    Past Medical History:  Diagnosis Date  . Arthritis   . Cyst, ovary, dermoid, left   . History of gestational diabetes   . History of Helicobacter pylori infection 09/2005  . 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 History of pregnancy induced hypertension   . Hyperlipidemia   . Hypertension    during pregnancy-no medications  .  Iron deficiency anemia   . Menorrhagia   . Nephrolithiasis    bilateral tiny nonobstructive calculi per CT 06-21-2016  . Uterine fibroid   . Wears glasses     Past Surgical History:  Procedure Laterality Date  . BREAST EXCISIONAL BIOPSY Left 1982   benign  . 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 CESAREAN SECTION  02-04-1999    vbac 1-06 and 05-19-2005  . ERCP W/ SPHINCTEROTOMY AND BALLOON DILATION  10/11/2005  . LAPAROSCOPIC CHOLECYSTECTOMY  10/04/2005   w/ ERCP  . LAPAROSCOPIC VAGINAL HYSTERECTOMY WITH SALPINGO OOPHORECTOMY Left  09/28/2016   Procedure: LAPAROSCOPIC ASSISTED VAGINAL HYSTERECTOMY WITH SALPINGO OOPHORECTOMY, right salpingectomy;  Surgeon: Dian Queen, MD;  Location: [facility];  Service: Gynecology;  Laterality: Left;  NEED BED FOR OVERNIGHT STAY    Social History   Socioeconomic History  . Marital status: Married    Spouse name: Not on file  . Number of children: 3  . Years of education: Not on file  . Highest education level: Not on file  Occupational History  . Occupation: math Product manager: [employer]  Tobacco Use  . Smoking status: Never Smoker  . Smokeless tobacco: Never Used  Substance and Sexual Activity  . Alcohol use: No    Alcohol/week: 0.0 standard drinks  . Drug use: No  . Sexual activity: Yes    Partners: Male    Birth control/protection: Other-see comments    Comment:  husband - vasectomy  Other Topics Concern  . Not on file  Social History Narrative   Married to [place], had son [name] 11-00, [name], and skylar   Social Determinants of Health   Financial Resource Strain:   . Difficulty of Paying Living Expenses:   Food Insecurity:   . Worried About Charity fundraiser in the Last Year:   . Arboriculturist in the Last Year:   Transportation Needs:   . Film/video editor (Medical):   Marland Kitchen Lack of Transportation (Non-Medical):   Physical Activity:   . Days of Exercise per Week:   . Minutes of Exercise per Session:   Stress:   . Feeling of Stress :   Social Connections:   . Frequency of Communication with Friends and Family:   . Frequency of Social Gatherings with Friends and Family:   . Attends Religious Services:   . Active Member of Clubs or Organizations:   . Attends Archivist Meetings:   Marland Kitchen Marital Status:     Family History  Problem Relation Age of Onset  . Diabetes Mother   . Hypertension Mother   . Breast cancer Maternal Grandmother   . Cancer Maternal Grandmother        breast and lung         ROS  Objective:   Vitals:   09/26/19 0816  BP: 138/82  Pulse: 71  Temp: (!) 96.4 F (35.8 C)  SpO2: 97%    Body mass index is 33.17 kg/m.   Physical Examination:  Physical Exam Vitals reviewed. Exam conducted with a chaperone present.  Constitutional:      General: She is not in acute distress.    Appearance: She is well-developed. She is obese.  HENT:     Right Ear: Tympanic membrane, ear canal and external ear normal.     Left Ear: Tympanic membrane, ear canal and external ear normal.  Eyes:     Extraocular Movements:  Extraocular movements intact.     Conjunctiva/sclera: Conjunctivae normal.  Cardiovascular:     Rate and Rhythm: Normal rate and regular rhythm.     Pulses: Normal pulses.     Heart sounds: Normal heart sounds.  Pulmonary:     Effort: Pulmonary effort is normal. No respiratory distress.     Breath sounds: Normal breath sounds.  Chest:     Chest wall: No tenderness.     Breasts:        Right: Normal.        Left: Normal.  Abdominal:     General: Bowel sounds are normal.     Palpations: Abdomen is soft.     Hernia: There is no hernia in the left inguinal area or right inguinal area.  Genitourinary:    Labia:        Right: No rash or tenderness.        Left: No rash or tenderness.      Vagina: Normal.     Adnexa: Right adnexa normal and left adnexa normal.     Comments: Cervix absent Musculoskeletal:        General: Normal range of motion.     Cervical back: Normal range of motion and neck supple.  Lymphadenopathy:     Cervical: No cervical adenopathy.     Upper Body:     Right upper body: No supraclavicular, axillary or pectoral adenopathy.     Left upper body: No supraclavicular or axillary adenopathy.     Lower Body: No right inguinal adenopathy. No left inguinal adenopathy.  Skin:    General: Skin is warm and dry.  Neurological:     Mental Status: She is alert and oriented to person, place, and time.     Deep Tendon  Reflexes: Reflexes are normal and symmetric.  Psychiatric:        Mood and Affect: Mood normal.        Behavior: Behavior normal.        Thought Content: Thought content normal.     ASSESSMENT and PLAN: This visit occurred during the SARS-CoV-2 public health emergency.  Safety protocols were in place, including screening questions prior to the visit, additional usage of staff PPE, and extensive cleaning of exam room while observing appropriate contact time as indicated for disinfecting solutions.   Yanitza was seen today for establish care.  Diagnoses and all orders for this visit:  Preventative health care -     Comprehensive metabolic panel -     CBC -     TSH -     Cytology - PAP( [place])  Encounter for Papanicolaou smear for cervical cancer screening -     Cytology - PAP( [place])  Screen for STD (sexually transmitted disease) -     Cervicovaginal ancillary only( [place]) -     HIV antibody (with reflex) -     RPR -     Hepatitis C Antibody  Type 2 diabetes mellitus with hyperglycemia, without long-term current use of insulin (HCC) -     Hemoglobin A1c -     Microalbumin / creatinine urine ratio -     metFORMIN (GLUCOPHAGE) 500 MG tablet; Take 1 tablet (500 mg total) by mouth daily with breakfast.  Mixed hyperlipidemia -     Lipid panel  HYPERTENSION, BENIGN SYSTEMIC -     losartan (COZAAR) 25 MG tablet; TAKE 1 TABLET BY MOUTH ONCE DAILY    DM (diabetes mellitus) ([place]) controlled with  metformin Negative neuropathy and nephropathy Up to date with eye exam LDL not at goal ASCVD risk of 9.8% BP at goal No tobacco use.  Advised about need for DASH diet and daily exercise. Consider statin use of no improve,ment in 14months  HLD (hyperlipidemia) LDL not at goal ASCVD risk of 9.8% BP at goal No tobacco use. Advised about need for DASH diet and daily exercise. Consider statin use of no improve,ment in 4months  Lipid Panel     Component Value  Date/Time   CHOL 217 (H) 09/26/2019 0857   TRIG 65.0 09/26/2019 0857   HDL 56.10 09/26/2019 0857   CHOLHDL 4 09/26/2019 0857   VLDL 13.0 09/26/2019 0857   LDLCALC 148 (H) 09/26/2019 0857   LDLDIRECT 145.1 10/26/2012 0827    HYPERTENSION, BENIGN SYSTEMIC BP at goal with losartan BP Readings from Last 3 Encounters:  09/26/19 138/82  09/25/18 134/90  04/06/18 (!) 148/84       Problem List Items Addressed This Visit      Cardiovascular and Mediastinum   HYPERTENSION, BENIGN SYSTEMIC    BP at goal with losartan BP Readings from Last 3 Encounters:  09/26/19 138/82  09/25/18 134/90  04/06/18 (!) 148/84        Relevant Medications   losartan (COZAAR) 25 MG tablet     Endocrine   DM (diabetes mellitus) ([place])    controlled with metformin Negative neuropathy and nephropathy Up to date with eye exam LDL not at goal ASCVD risk of 9.8% BP at goal No tobacco use.  Advised about need for DASH diet and daily exercise. Consider statin use of no improve,ment in 45months      Relevant Medications   metFORMIN (GLUCOPHAGE) 500 MG tablet   losartan (COZAAR) 25 MG tablet   Other Relevant Orders   Hemoglobin A1c (Completed)   Microalbumin / creatinine urine ratio (Completed)     Other   HLD (hyperlipidemia)    LDL not at goal ASCVD risk of 9.8% BP at goal No tobacco use. Advised about need for DASH diet and daily exercise. Consider statin use of no improve,ment in 50months  Lipid Panel     Component Value Date/Time   CHOL 217 (H) 09/26/2019 0857   TRIG 65.0 09/26/2019 0857   HDL 56.10 09/26/2019 0857   CHOLHDL 4 09/26/2019 0857   VLDL 13.0 09/26/2019 0857   LDLCALC 148 (H) 09/26/2019 0857   LDLDIRECT 145.1 10/26/2012 0827        Relevant Medications   losartan (COZAAR) 25 MG tablet   Other Relevant Orders   Lipid panel (Completed)   Visit for routine gyn exam    Other Visit Diagnoses    Preventative health care    -  Primary   Relevant Orders    Comprehensive metabolic panel (Completed)   CBC (Completed)   TSH (Completed)   Cytology - PAP( [place]) (Completed)   Screen for STD (sexually transmitted disease)       Relevant Orders   Cervicovaginal ancillary only( [place]) (Completed)   HIV antibody (with reflex) (Completed)   RPR (Completed)   Hepatitis C Antibody (Completed)      Follow up: Return in about 1 week (around 10/03/2019) for knee pain, weight loss, toe pain (F73f, 29mins).  Wilfred Lacy, NP

## 2019-09-28 NOTE — Assessment & Plan Note (Signed)
LDL not at goal ASCVD risk of 9.8% BP at goal No tobacco use. Advised about need for DASH diet and daily exercise. Consider statin use of no improve,ment in 60months  Lipid Panel     Component Value Date/Time   CHOL 217 (H) 09/26/2019 0857   TRIG 65.0 09/26/2019 0857   HDL 56.10 09/26/2019 0857   CHOLHDL 4 09/26/2019 0857   VLDL 13.0 09/26/2019 0857   LDLCALC 148 (H) 09/26/2019 0857   LDLDIRECT 145.1 10/26/2012 0827

## 2019-09-28 NOTE — Assessment & Plan Note (Signed)
controlled with metformin Negative neuropathy and nephropathy Up to date with eye exam LDL not at goal ASCVD risk of 9.8% BP at goal No tobacco use.  Advised about need for DASH diet and daily exercise. Consider statin use of no improve,ment in 53months

## 2019-09-28 NOTE — Telephone Encounter (Signed)
Form completed.

## 2019-09-28 NOTE — Telephone Encounter (Signed)
Left pt a voicemail letting her know forms are completed and ready for her to pick up.

## 2019-10-03 ENCOUNTER — Other Ambulatory Visit: Payer: Self-pay

## 2019-10-04 ENCOUNTER — Encounter: Payer: Self-pay | Admitting: Nurse Practitioner

## 2019-10-04 ENCOUNTER — Ambulatory Visit (INDEPENDENT_AMBULATORY_CARE_PROVIDER_SITE_OTHER): Payer: BC Managed Care – PPO | Admitting: Nurse Practitioner

## 2019-10-04 ENCOUNTER — Ambulatory Visit (INDEPENDENT_AMBULATORY_CARE_PROVIDER_SITE_OTHER): Payer: BC Managed Care – PPO

## 2019-10-04 VITALS — BP 128/84 | HR 86 | Temp 96.6°F | Ht 70.0 in | Wt 232.2 lb

## 2019-10-04 DIAGNOSIS — K921 Melena: Secondary | ICD-10-CM | POA: Diagnosis not present

## 2019-10-04 DIAGNOSIS — M1712 Unilateral primary osteoarthritis, left knee: Secondary | ICD-10-CM | POA: Diagnosis not present

## 2019-10-04 DIAGNOSIS — M25562 Pain in left knee: Secondary | ICD-10-CM

## 2019-10-04 DIAGNOSIS — M170 Bilateral primary osteoarthritis of knee: Secondary | ICD-10-CM

## 2019-10-04 DIAGNOSIS — M25561 Pain in right knee: Secondary | ICD-10-CM

## 2019-10-04 DIAGNOSIS — K5904 Chronic idiopathic constipation: Secondary | ICD-10-CM | POA: Diagnosis not present

## 2019-10-04 DIAGNOSIS — G8929 Other chronic pain: Secondary | ICD-10-CM

## 2019-10-04 DIAGNOSIS — M1711 Unilateral primary osteoarthritis, right knee: Secondary | ICD-10-CM | POA: Diagnosis not present

## 2019-10-04 MED ORDER — SACCHAROMYCES BOULARDII 250 MG PO CAPS: 250.0000 mg | ORAL_CAPSULE | Freq: Two times a day (BID) | ORAL | Status: DC

## 2019-10-04 MED ORDER — METAMUCIL 0.52 G PO CAPS: 0.5200 g | ORAL_CAPSULE | Freq: Every day | ORAL | Status: DC

## 2019-10-04 MED ORDER — DICLOFENAC SODIUM 1 % EX GEL: 2.0000 g | Freq: Four times a day (QID) | CUTANEOUS | Status: DC

## 2019-10-04 NOTE — Progress Notes (Signed)
Subjective:  Patient ID: Estill Bakes, female    DOB: [birth year]  Age: 49 y.o. MRN: [id number]  CC: Follow-up (1 week-knee an toe pain//weight loss-pain doing better-working out helped alot-doesn't know if she should follow up with sports medicine//weight loss--complains about runny stool and if solid small pellets//pt needs refill on Albuterol)  Constipation This is a chronic problem. The current episode started more than 1 year ago. The problem has been waxing and waning since onset. Her stool frequency is 1 time per day. The stool is described as pellet like and watery. The patient is not on a high fiber diet. She exercises regularly. There has not been adequate water intake. Associated symptoms include bloating, diarrhea and melena. Pertinent negatives include no abdominal pain, anorexia, back pain, difficulty urinating, fecal incontinence, fever, flatus, hematochezia, hemorrhoids, nausea, rectal pain, vomiting or weight loss. Risk factors include obesity. Her past medical history is significant for endocrine disease.  Knee Pain  The incident occurred more than 1 week ago. There was no injury mechanism. The pain is present in the left knee and right knee. The quality of the pain is described as aching. Associated symptoms include an inability to bear weight. Pertinent negatives include no loss of motion, loss of sensation, muscle weakness, numbness or tingling. Associated symptoms comments: Difficulty going down stair. She reports no foreign bodies present. The symptoms are aggravated by movement and weight bearing. She has tried rest, acetaminophen and NSAIDs for the symptoms. The treatment provided moderate relief.   No FHx of colon cancer or colon polyp. Bowel pattern not related to metformin use. S/p cholecystectomy  chronic bilateral knee pain (L>R), known hx of OA, took part in competitive sports in high school and college, has completed joint injections and PT in past with moderate  relief. Reports worsening knee pain and swelling, especially with prolong sitting and descending stairs. Denies any joint instability.  Reviewed previous left knee DG 11/2015: Moderate joint space narrowing and mild degenerative spurring seen involving the medial compartment. Degenerative spurring also seen involving the patella.Osteoarthritis with predominant involvement of medial compartment. Small knee joint effusion.  Reviewed past Medical, Social and Family history today.  Outpatient Medications Prior to Visit  Medication Sig Dispense Refill  . Albuterol Sulfate (PROAIR RESPICLICK) 992 (90 Base) MCG/ACT AEPB Inhale 2 puffs into the lungs every 6 (six) hours as needed. 1 each 0  . losartan (COZAAR) 25 MG tablet TAKE 1 TABLET BY MOUTH ONCE DAILY 90 tablet 3  . metFORMIN (GLUCOPHAGE) 500 MG tablet Take 1 tablet (500 mg total) by mouth daily with breakfast. 90 tablet 3   No facility-administered medications prior to visit.   ROS See HPI  Objective:  BP 128/84   Pulse 86   Temp (!) 96.6 F (35.9 C) (Tympanic)   Ht 5\' 10"  (1.778 m)   Wt 232 lb 3.2 oz (105.3 kg)   LMP 09/16/2016 (Approximate)   SpO2 97%   BMI 33.32 kg/m   BP Readings from Last 3 Encounters:  10/04/19 128/84  09/26/19 138/82  09/25/18 134/90   Wt Readings from Last 3 Encounters:  10/04/19 232 lb 3.2 oz (105.3 kg)  09/26/19 231 lb 3.2 oz (104.9 kg)  09/25/18 235 lb 12.8 oz (107 kg)    Physical Exam Vitals reviewed.  Constitutional:      Appearance: She is obese.  Cardiovascular:     Rate and Rhythm: Normal rate.     Pulses: Normal pulses.  Pulmonary:     Effort:  Pulmonary effort is normal.  Abdominal:     Palpations: Abdomen is soft.     Tenderness: There is no abdominal tenderness. There is no guarding.  Musculoskeletal:        General: Swelling present. No tenderness or deformity.     Right knee: Effusion and crepitus present. No bony tenderness. Normal range of motion. No tenderness.     Left  knee: Effusion and crepitus present. No bony tenderness. Normal range of motion. No tenderness.     Right lower leg: Normal. No edema.     Left lower leg: Normal. No edema.       Feet:  Skin:    Findings: No erythema or rash.  Neurological:     Mental Status: She is alert and oriented to person, place, and time.    Lab Results  Component Value Date   WBC 5.0 09/26/2019   HGB 11.9 (L) 09/26/2019   HCT 35.0 (L) 09/26/2019   PLT 312.0 09/26/2019   GLUCOSE 169 (H) 09/26/2019   CHOL 217 (H) 09/26/2019   TRIG 65.0 09/26/2019   HDL 56.10 09/26/2019   LDLDIRECT 145.1 10/26/2012   LDLCALC 148 (H) 09/26/2019   ALT 18 09/26/2019   AST 14 09/26/2019   NA 137 09/26/2019   K 3.5 09/26/2019   CL 104 09/26/2019   CREATININE 0.80 09/26/2019   BUN 12 09/26/2019   CO2 28 09/26/2019   TSH 1.31 09/26/2019   HGBA1C 7.7 (H) 09/26/2019   MICROALBUR 3.2 (H) 09/26/2019    Assessment & Plan:  This visit occurred during the SARS-CoV-2 public health emergency.  Safety protocols were in place, including screening questions prior to the visit, additional usage of staff PPE, and extensive cleaning of exam room while observing appropriate contact time as indicated for disinfecting solutions.   Meliah was seen today for follow-up.  Diagnoses and all orders for this visit:  Primary osteoarthritis of both knees -     Ambulatory referral to Sports Medicine -     diclofenac Sodium (VOLTAREN) 1 % GEL; Apply 2 g topically 4 (four) times daily.  Chronic idiopathic constipation -     saccharomyces boulardii (FLORASTOR) 250 MG capsule; Take 1 capsule (250 mg total) by mouth 2 (two) times daily. -     psyllium (METAMUCIL) 0.52 g capsule; Take 1 capsule (0.52 g total) by mouth daily.  Melena -     Cancel: Fecal occult blood, imunochemical([facility]) -     Fecal occult blood, imunochemical; Standing  Chronic pain of both knees -     DG Knee Complete 4 Views Left -     DG Knee Complete 4 Views  Right   I am having Eunice C. Siess start on saccharomyces boulardii, Metamucil, and diclofenac Sodium. I am also having her maintain her Albuterol Sulfate, metFORMIN, and losartan.  Meds ordered this encounter  Medications  . saccharomyces boulardii (FLORASTOR) 250 MG capsule    Sig: Take 1 capsule (250 mg total) by mouth 2 (two) times daily.    Order Specific Question:   Supervising Provider    Answer:   Ronnald Nian [[id number]]  . psyllium (METAMUCIL) 0.52 g capsule    Sig: Take 1 capsule (0.52 g total) by mouth daily.    Order Specific Question:   Supervising Provider    Answer:   Ronnald Nian [[id number]]  . diclofenac Sodium (VOLTAREN) 1 % GEL    Sig: Apply 2 g topically 4 (four) times daily.    Order  Specific Question:   Supervising Provider    Answer:   Ronnald Nian [[id number]]    Problem List Items Addressed This Visit    None    Visit Diagnoses    Primary osteoarthritis of both knees    -  Primary   Relevant Medications   diclofenac Sodium (VOLTAREN) 1 % GEL   Other Relevant Orders   Ambulatory referral to Sports Medicine   Chronic idiopathic constipation       Relevant Medications   saccharomyces boulardii (FLORASTOR) 250 MG capsule   psyllium (METAMUCIL) 0.52 g capsule   Melena       Relevant Orders   Fecal occult blood, imunochemical   Chronic pain of both knees       Relevant Orders   DG Knee Complete 4 Views Left   DG Knee Complete 4 Views Right      Follow-up: Return if symptoms worsen or fail to improve.  Wilfred Lacy, NP

## 2019-10-04 NOTE — Patient Instructions (Signed)
Go to lab for x-ray and stool collection kit.  Use voltaren gel  For knee pain.  Start high fiber diet and adequate oral hydration to help to stool.  You will be contacted to schedule appt with sports medicine. Ask about non steriod joint injection (e.g synvisc?)  High-Fiber Diet Fiber, also called dietary fiber, is a type of carbohydrate that is found in fruits, vegetables, whole grains, and beans. A high-fiber diet can have many health benefits. Your health care provider may recommend a high-fiber diet to help:  Prevent constipation. Fiber can make your bowel movements more regular.  Lower your cholesterol.  Relieve the following conditions: ? Swelling of veins in the anus (hemorrhoids). ? Swelling and irritation (inflammation) of specific areas of the digestive tract (uncomplicated diverticulosis). ? A problem of the large intestine (colon) that sometimes causes pain and diarrhea (irritable bowel syndrome, IBS).  Prevent overeating as part of a weight-loss plan.  Prevent heart disease, type 2 diabetes, and certain cancers. What is my plan? The recommended daily fiber intake in grams (g) includes:  38 g for men age 4 or younger.  30 g for men over age 23.  80 g for women age 63 or younger.  21 g for women over age 5. You can get the recommended daily intake of dietary fiber by:  Eating a variety of fruits, vegetables, grains, and beans.  Taking a fiber supplement, if it is not possible to get enough fiber through your diet. What do I need to know about a high-fiber diet?  It is better to get fiber through food sources rather than from fiber supplements. There is not a lot of research about how effective supplements are.  Always check the fiber content on the nutrition facts label of any prepackaged food. Look for foods that contain 5 g of fiber or more per serving.  Talk with a diet and nutrition specialist (dietitian) if you have questions about specific foods that  are recommended or not recommended for your medical condition, especially if those foods are not listed below.  Gradually increase how much fiber you consume. If you increase your intake of dietary fiber too quickly, you may have bloating, cramping, or gas.  Drink plenty of water. Water helps you to digest fiber. What are tips for following this plan?  Eat a wide variety of high-fiber foods.  Make sure that half of the grains that you eat each day are whole grains.  Eat breads and cereals that are made with whole-grain flour instead of refined flour or white flour.  Eat brown rice, bulgur wheat, or millet instead of white rice.  Start the day with a breakfast that is high in fiber, such as a cereal that contains 5 g of fiber or more per serving.  Use beans in place of meat in soups, salads, and pasta dishes.  Eat high-fiber snacks, such as berries, raw vegetables, nuts, and popcorn.  Choose whole fruits and vegetables instead of processed forms like juice or sauce. What foods can I eat?  Fruits Berries. Pears. Apples. Oranges. Avocado. Prunes and raisins. Dried figs. Vegetables Sweet potatoes. Spinach. Kale. Artichokes. Cabbage. Broccoli. Cauliflower. Green peas. Carrots. Squash. Grains Whole-grain breads. Multigrain cereal. Oats and oatmeal. Brown rice. Barley. Bulgur wheat. [place]. Quinoa. Bran muffins. Popcorn. Rye wafer crackers. Meats and other proteins Navy, kidney, and pinto beans. Soybeans. Split peas. Lentils. Nuts and seeds. Dairy Fiber-fortified yogurt. Beverages Fiber-fortified soy milk. Fiber-fortified orange juice. Other foods Fiber bars. The items  listed above may not be a complete list of recommended foods and beverages. Contact a dietitian for more options. What foods are not recommended? Fruits Fruit juice. Cooked, strained fruit. Vegetables Fried potatoes. Canned vegetables. Well-cooked vegetables. Grains White bread. Pasta made with refined flour.  White rice. Meats and other proteins Fatty cuts of meat. Fried chicken or fried fish. Dairy Milk. Yogurt. Cream cheese. Sour cream. Fats and oils Butters. Beverages Soft drinks. Other foods Cakes and pastries. The items listed above may not be a complete list of foods and beverages to avoid. Contact a dietitian for more information. Summary  Fiber is a type of carbohydrate. It is found in fruits, vegetables, whole grains, and beans.  There are many health benefits of eating a high-fiber diet, such as preventing constipation, lowering blood cholesterol, helping with weight loss, and reducing your risk of heart disease, diabetes, and certain cancers.  Gradually increase your intake of fiber. Increasing too fast can result in cramping, bloating, and gas. Drink plenty of water while you increase your fiber.  The best sources of fiber include whole fruits and vegetables, whole grains, nuts, seeds, and beans. This information is not intended to replace advice given to you by your health care provider. Make sure you discuss any questions you have with your health care provider. Document Revised: 01/24/2017 Document Reviewed: 01/24/2017 Elsevier Patient Education  2020 [employer].

## 2019-10-05 ENCOUNTER — Telehealth: Payer: Self-pay | Admitting: Nurse Practitioner

## 2019-10-05 ENCOUNTER — Encounter: Payer: Self-pay | Admitting: Nurse Practitioner

## 2019-10-05 DIAGNOSIS — G8929 Other chronic pain: Secondary | ICD-10-CM

## 2019-10-05 DIAGNOSIS — M170 Bilateral primary osteoarthritis of knee: Secondary | ICD-10-CM

## 2019-10-05 NOTE — Telephone Encounter (Signed)
-----   Message from Lucila [place], [place] sent at 10/05/2019 11:21 AM EDT ----- Pt was notified and verbally understood results.  Barbara [name] she is okay with the referral to Ortho instead of Sports medicine.

## 2019-10-05 NOTE — Telephone Encounter (Signed)
Please cancel sports medicine referral Patient will like to see ortho instead. Thank you

## 2019-10-09 NOTE — Telephone Encounter (Signed)
Referral cancelled. 

## 2019-10-16 ENCOUNTER — Other Ambulatory Visit: Payer: Self-pay

## 2019-10-16 ENCOUNTER — Other Ambulatory Visit: Payer: BC Managed Care – PPO

## 2019-10-16 DIAGNOSIS — K921 Melena: Secondary | ICD-10-CM

## 2019-10-17 ENCOUNTER — Other Ambulatory Visit (INDEPENDENT_AMBULATORY_CARE_PROVIDER_SITE_OTHER): Payer: BC Managed Care – PPO

## 2019-10-17 DIAGNOSIS — K921 Melena: Secondary | ICD-10-CM | POA: Diagnosis not present

## 2019-10-17 LAB — FECAL OCCULT BLOOD, IMMUNOCHEMICAL: Fecal Occult Bld: NEGATIVE

## 2019-10-18 ENCOUNTER — Encounter: Payer: Self-pay | Admitting: Nurse Practitioner

## 2019-10-19 ENCOUNTER — Ambulatory Visit (INDEPENDENT_AMBULATORY_CARE_PROVIDER_SITE_OTHER): Payer: BC Managed Care – PPO | Admitting: Orthopedic Surgery

## 2019-10-19 VITALS — Ht 69.5 in | Wt 231.0 lb

## 2019-10-19 DIAGNOSIS — M1712 Unilateral primary osteoarthritis, left knee: Secondary | ICD-10-CM | POA: Diagnosis not present

## 2019-10-19 DIAGNOSIS — M1711 Unilateral primary osteoarthritis, right knee: Secondary | ICD-10-CM

## 2019-10-20 ENCOUNTER — Encounter: Payer: Self-pay | Admitting: Orthopedic Surgery
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
## 2020-01-17 ENCOUNTER — Other Ambulatory Visit: Payer: Self-pay | Admitting: Nurse Practitioner

## 2020-01-17 DIAGNOSIS — Z1231 Encounter for screening mammogram for malignant neoplasm of breast: Secondary | ICD-10-CM

## 2020-02-13 ENCOUNTER — Ambulatory Visit
Admission: RE | Admit: 2020-02-13 | Discharge: 2020-02-13 | Disposition: A | Discharge status: Home / Self Care | Payer: BC Managed Care – PPO | Source: Ambulatory Visit | Attending: Nurse Practitioner | Admitting: Nurse Practitioner

## 2020-02-13 ENCOUNTER — Other Ambulatory Visit: Payer: Self-pay

## 2020-02-13 DIAGNOSIS — Z1231 Encounter for screening mammogram for malignant neoplasm of breast: Secondary | ICD-10-CM

## 2020-08-25 ENCOUNTER — Other Ambulatory Visit: Payer: Self-pay

## 2020-08-26 ENCOUNTER — Ambulatory Visit (INDEPENDENT_AMBULATORY_CARE_PROVIDER_SITE_OTHER): Payer: BC Managed Care – PPO | Admitting: Nurse Practitioner

## 2020-08-26 ENCOUNTER — Encounter: Payer: Self-pay | Admitting: Nurse Practitioner

## 2020-08-26 VITALS — BP 130/86 | HR 87 | Temp 97.0°F | Ht 69.0 in | Wt 224.0 lb

## 2020-08-26 DIAGNOSIS — S161XXA Strain of muscle, fascia and tendon at neck level, initial encounter: Secondary | ICD-10-CM

## 2020-08-26 DIAGNOSIS — M546 Pain in thoracic spine: Secondary | ICD-10-CM

## 2020-08-26 MED ORDER — METHOCARBAMOL 500 MG PO TABS: 500.0000 mg | ORAL_TABLET | Freq: Three times a day (TID) | ORAL | 0 refills | Status: DC | PRN
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
## 2020-08-26 NOTE — Patient Instructions (Signed)
Use ibuprofen 400-600mg  every 8hrs as needed for pain Do not take robaxin and drive due to risk of sedation  Muscle Strain A muscle strain is an injury that occurs when a muscle is stretched beyond its normal length. Usually, a small number of muscle fibers are torn when this happens. There are three types of muscle strains. First-degree strains have the least amount of muscle fiber tearing and the least amount of pain. Second-degree and third-degree strains have more tearing and pain. Usually, recovery from muscle strain takes 1-2 weeks. Complete healing normally takes 5-6 weeks. What are the causes? This condition is caused when a sudden, violent force is placed on a muscle and stretches it too far. This may occur with a fall, lifting, or sports. What increases the risk? This condition is more likely to develop in athletes and people who are physically active. What are the signs or symptoms? Symptoms of this condition include:  Pain.  Bruising.  Swelling.  Trouble using the muscle. How is this diagnosed? This condition is diagnosed based on a physical exam and your medical history. Tests may also be done, including an X-ray, ultrasound, or MRI. How is this treated? This condition is initially treated with PRICE therapy. This therapy involves:  Protecting the muscle from being injured again.  Resting the injured muscle.  Icing the injured muscle.  Applying pressure (compression) to the injured muscle. This may be done with a splint or elastic bandage.  Raising (elevating) the injured muscle. Your health care provider may also recommend medicine for pain. Follow these instructions at home: If you have a splint:  Wear the splint as told by your health care provider. Remove it only as told by your health care provider.  Loosen the splint if your fingers or toes tingle, become numb, or turn cold and blue.  Keep the splint clean.  If the splint is not waterproof: ? Do not let  it get wet. ? Cover it with a watertight covering when you take a bath or a shower. Managing pain, stiffness, and swelling  If directed, put ice on the injured area: ? If you have a removable splint, remove it as told by your health care provider. ? Put ice in a plastic bag. ? Place a towel between your skin and the bag. ? Leave the ice on for 20 minutes, 2-3 times a day.  Move your fingers or toes often to avoid stiffness and to lessen swelling.  Raise (elevate) the injured area above the level of your heart while you are sitting or lying down.  Wear an elastic bandage as told by your health care provider. Make sure that it is not too tight.   General instructions  Treatment may include medicines for pain and inflammation that are taken by mouth or applied to the skin, prescription pain medicine, or muscle relaxants. Take over-the-counter and prescription medicines only as told by your health care provider.  Restrict your activity and rest the injured muscle as told by your health care provider. Gentle movements may be allowed.  If physical therapy was prescribed, do exercises as told by your health care provider.  Do not put pressure on any part of the splint until it is fully hardened. This may take several hours.  Do not use any products that contain nicotine or tobacco, such as cigarettes and e-cigarettes. These can delay bone healing. If you need help quitting, ask your health care provider.  Ask your health care provider when it is  safe to drive if you have a splint.  Keep all follow-up visits as told by your health care provider. This is important. How is this prevented?  Warm up before exercising. This helps to prevent future muscle strains. Contact a health care provider if:  You have more pain or swelling in the injured area. Get help right away if:  You have numbness or tingling or lose a lot of strength in the injured area. Summary  A muscle strain is an injury  that occurs when a muscle is stretched beyond its normal length.  This condition is caused when a sudden, violent force is placed on a muscle and stretches it too far.  This condition is initially treated with PRICE therapy, which involves protecting, resting, icing, compressing, and elevating.  Gentle movements may be allowed. If physical therapy was prescribed, do exercises as told by your health care provider. This information is not intended to replace advice given to you by your health care provider. Make sure you discuss any questions you have with your health care provider. Document Revised: 12/14/2019 Document Reviewed: 12/14/2019 Elsevier Patient Education  2021 [employer].

## 2020-09-26 ENCOUNTER — Other Ambulatory Visit: Payer: Self-pay

## 2020-09-26 ENCOUNTER — Ambulatory Visit (INDEPENDENT_AMBULATORY_CARE_PROVIDER_SITE_OTHER): Payer: BC Managed Care – PPO | Admitting: Nurse Practitioner

## 2020-09-26 ENCOUNTER — Encounter: Payer: Self-pay | Admitting: Nurse Practitioner

## 2020-09-26 VITALS — BP 150/100 | HR 74 | Temp 97.1°F | Ht 69.0 in | Wt 227.6 lb

## 2020-09-26 DIAGNOSIS — Z0001 Encounter for general adult medical examination with abnormal findings: Secondary | ICD-10-CM

## 2020-09-26 DIAGNOSIS — E785 Hyperlipidemia, unspecified: Secondary | ICD-10-CM | POA: Diagnosis not present

## 2020-09-26 DIAGNOSIS — E1165 Type 2 diabetes mellitus with hyperglycemia: Secondary | ICD-10-CM

## 2020-09-26 DIAGNOSIS — Z1211 Encounter for screening for malignant neoplasm of colon: Secondary | ICD-10-CM | POA: Diagnosis not present

## 2020-09-26 DIAGNOSIS — I10 Essential (primary) hypertension: Secondary | ICD-10-CM

## 2020-09-26 DIAGNOSIS — K529 Noninfective gastroenteritis and colitis, unspecified: Secondary | ICD-10-CM | POA: Diagnosis not present

## 2020-09-26 LAB — COMPREHENSIVE METABOLIC PANEL
ALT: 19 U/L (ref 0–35)
AST: 14 U/L (ref 0–37)
Albumin: 4.4 g/dL (ref 3.5–5.2)
Alkaline Phosphatase: 88 U/L (ref 39–117)
BUN: 13 mg/dL (ref 6–23)
CO2: 29 mEq/L (ref 19–32)
Calcium: 9.6 mg/dL (ref 8.4–10.5)
Chloride: 101 mEq/L (ref 96–112)
Creatinine, Ser: 0.84 mg/dL (ref 0.40–1.20)
GFR: 82.32 mL/min (ref >60.00)
Glucose, Bld: 168 mg/dL — ABNORMAL HIGH (ref 70–99)
Potassium: 4.2 mEq/L (ref 3.5–5.1)
Sodium: 139 mEq/L (ref 135–145)
Total Bilirubin: 0.4 mg/dL (ref 0.2–1.2)
Total Protein: 7.4 g/dL (ref 6.0–8.3)

## 2020-09-26 LAB — LIPID PANEL
Cholesterol: 258 mg/dL — ABNORMAL HIGH (ref 0–200)
HDL: 68.4 mg/dL (ref >39.00)
LDL Cholesterol: 172 mg/dL — ABNORMAL HIGH (ref 0–99)
NonHDL: 189.16
Total CHOL/HDL Ratio: 4
Triglycerides: 84 mg/dL (ref 0.0–149.0)
VLDL: 16.8 mg/dL (ref 0.0–40.0)

## 2020-09-26 LAB — MICROALBUMIN / CREATININE URINE RATIO
Creatinine,U: 160.5 mg/dL
Microalb Creat Ratio: 1.4 mg/g (ref 0.0–30.0)
Microalb, Ur: 2.2 mg/dL — ABNORMAL HIGH (ref 0.0–1.9)

## 2020-09-26 LAB — CBC
HCT: 35.6 % — ABNORMAL LOW (ref 36.0–46.0)
Hemoglobin: 11.9 g/dL — ABNORMAL LOW (ref 12.0–15.0)
MCHC: 33.6 g/dL (ref 30.0–36.0)
MCV: 88.8 fl (ref 78.0–100.0)
Platelets: 340 10*3/uL (ref 150.0–400.0)
RBC: 4.01 Mil/uL (ref 3.87–5.11)
RDW: 14.3 % (ref 11.5–15.5)
WBC: 5.5 10*3/uL (ref 4.0–10.5)

## 2020-09-26 LAB — HEMOGLOBIN A1C: Hgb A1c MFr Bld: 9.3 % — ABNORMAL HIGH (ref 4.6–6.5)

## 2020-09-26 LAB — TSH: TSH: 1.32 u[IU]/mL (ref 0.35–4.50)

## 2020-09-26 MED ORDER — ROSUVASTATIN CALCIUM 10 MG PO TABS: 10.0000 mg | ORAL_TABLET | Freq: Every day | ORAL | 3 refills | Status: DC

## 2020-09-26 MED ORDER — LOSARTAN POTASSIUM 25 MG PO TABS: 25.0000 mg | ORAL_TABLET | Freq: Two times a day (BID) | ORAL | 3 refills | Status: DC

## 2020-09-26 MED ORDER — GLIPIZIDE ER 5 MG PO TB24: 5.0000 mg | ORAL_TABLET | Freq: Every day | ORAL | 3 refills | Status: DC

## 2020-09-26 MED ORDER — METFORMIN HCL ER 750 MG PO TB24: 750.0000 mg | ORAL_TABLET | Freq: Every day | ORAL | 3 refills | Status: DC

## 2020-09-26 NOTE — Assessment & Plan Note (Signed)
BP not at goal BP Readings from Last 3 Encounters:  09/26/20 (!) 150/100  08/26/20 130/86  10/04/19 128/84   Increase losartan to 25mg  BID Advised about need to monitor BP at home, maintain DASH diet and start daily exercise. Repeat BMP F/up in 29month

## 2020-09-26 NOTE — Assessment & Plan Note (Addendum)
Repeat hgbA1c: uncontrolled with A1c at 9.3 Increase metformin to 750mg  daily and add glipizide 5mg  daily F/up in 11months

## 2020-09-26 NOTE — Patient Instructions (Signed)
You will be contacted to schedule appt with GI Ok to use either of the following probiotics: Uqora probiotics or Feminine balance probiotics or [place] Probiotics or Florastor Probiotics.  Monitor BP at home 3x/week in AM Call office if BP persistently >140/90 after 2weeks of losartan dose increase. Maintain DASh diet and start daily exercise.  Schedule appt for diabetic eye exam and have report faxed to me Schedule appt for annual mammogram in November.  Preventive Care 17-28 Years Old, Female Preventive care refers to lifestyle choices and visits with your health care provider that can promote health and wellness. This includes: A yearly physical exam. This is also called an annual wellness visit. Regular dental and eye exams. Immunizations. Screening for certain conditions. Healthy lifestyle choices, such as: Eating a healthy diet. Getting regular exercise. Not using drugs or products that contain nicotine and tobacco. Limiting alcohol use. What can I expect for my preventive care visit? Physical exam Your health care provider will check your: Height and weight. These may be used to calculate your BMI (body mass index). BMI is a measurement that tells if you are at a healthy weight. Heart rate and blood pressure. Body temperature. Skin for abnormal spots. Counseling Your health care provider may ask you questions about your: Past medical problems. Family's medical history. Alcohol, tobacco, and drug use. Emotional well-being. Home life and relationship well-being. Sexual activity. Diet, exercise, and sleep habits. Work and work Statistician. Access to firearms. Method of birth control. Menstrual cycle. Pregnancy history. What immunizations do I need?  Vaccines are usually given at various ages, according to a schedule. Your health care provider will recommend vaccines for you based on your age, medicalhistory, and lifestyle or other factors, such as travel or  where you work. What tests do I need? Blood tests Lipid and cholesterol levels. These may be checked every 5 years, or more often if you are over 70 years old. Hepatitis C test. Hepatitis B test. Screening Lung cancer screening. You may have this screening every year starting at age 40 if you have a 30-pack-year history of smoking and currently smoke or have quit within the past 15 years. Colorectal cancer screening. All adults should have this screening starting at age 3 and continuing until age 40. Your health care provider may recommend screening at age 63 if you are at increased risk. You will have tests every 1-10 years, depending on your results and the type of screening test. Diabetes screening. This is done by checking your blood sugar (glucose) after you have not eaten for a while (fasting). You may have this done every 1-3 years. Mammogram. This may be done every 1-2 years. Talk with your health care provider about when you should start having regular mammograms. This may depend on whether you have a family history of breast cancer. BRCA-related cancer screening. This may be done if you have a family history of breast, ovarian, tubal, or peritoneal cancers. Pelvic exam and Pap test. This may be done every 3 years starting at age 78. Starting at age 31, this may be done every 5 years if you have a Pap test in combination with an HPV test. Other tests STD (sexually transmitted disease) testing, if you are at risk. Bone density scan. This is done to screen for osteoporosis. You may have this scan if you are at high risk for osteoporosis. Talk with your health care provider about your test results, treatment options,and if necessary, the need for more tests. Follow these instructions  at home: Eating and drinking  Eat a diet that includes fresh fruits and vegetables, whole grains, lean protein, and low-fat dairy products. Take vitamin and mineral supplements as recommended by your  health care provider. Do not drink alcohol if: Your health care provider tells you not to drink. You are pregnant, may be pregnant, or are planning to become pregnant. If you drink alcohol: Limit how much you have to 0-1 drink a day. Be aware of how much alcohol is in your drink. In the [place] one drink equals one 12 oz bottle of beer (355 mL), one 5 oz glass of wine (148 mL), or one 1 oz glass of hard liquor (44 mL).  Lifestyle Take daily care of your teeth and gums. Brush your teeth every morning and night with fluoride toothpaste. Floss one time each day. Stay active. Exercise for at least 30 minutes 5 or more days each week. Do not use any products that contain nicotine or tobacco, such as cigarettes, e-cigarettes, and chewing tobacco. If you need help quitting, ask your health care provider. Do not use drugs. If you are sexually active, practice safe sex. Use a condom or other form of protection to prevent STIs (sexually transmitted infections). If you do not wish to become pregnant, use a form of birth control. If you plan to become pregnant, see your health care provider for a prepregnancy visit. If told by your health care provider, take low-dose aspirin daily starting at age 89. Find healthy ways to cope with stress, such as: Meditation, yoga, or listening to music. Journaling. Talking to a trusted person. Spending time with friends and family. Safety Always wear your seat belt while driving or riding in a vehicle. Do not drive: If you have been drinking alcohol. Do not ride with someone who has been drinking. When you are tired or distracted. While texting. Wear a helmet and other protective equipment during sports activities. If you have firearms in your house, make sure you follow all gun safety procedures. What's next? Visit your health care provider once a year for an annual wellness visit. Ask your health care provider how often you should have your eyes and teeth  checked. Stay up to date on all vaccines. This information is not intended to replace advice given to you by your health care provider. Make sure you discuss any questions you have with your healthcare provider. Document Revised: 12/25/2019 Document Reviewed: 12/01/2017 Elsevier Patient Education  2022 [employer].

## 2020-09-26 NOTE — Assessment & Plan Note (Addendum)
Repeat lipid panel: LDL not at goal Start crestor

## 2020-09-26 NOTE — Progress Notes (Addendum)
Subjective:    Patient ID: Barbara [name], female    DOB: 1970/08/06, 50 y.o.   MRN: [id number]  Patient presents today for CPE and eval of chronic conditions  HPI DM (diabetes mellitus) ([place]) Repeat hgbA1c: uncontrolled with A1c at 9.3 Increase metformin to 750mg  daily and add glipizide 5mg  daily F/up in 8months  HYPERTENSION, BENIGN SYSTEMIC BP not at goal BP Readings from Last 3 Encounters:  09/26/20 (!) 150/100  08/26/20 130/86  10/04/19 128/84   Increase losartan to 25mg  BID Advised about need to monitor BP at home, maintain DASH diet and start daily exercise. Repeat BMP F/up in 30month  HLD (hyperlipidemia) Repeat lipid panel: LDL not at goal Start crestor   Vision:advised to schedule diabetic eye exam Dental:up to date Diet:regular Exercise:regular Weight:  Wt Readings from Last 3 Encounters:  09/26/20 227 lb 9.6 oz (103.2 kg)  08/26/20 224 lb (101.6 kg)  10/19/19 231 lb (104.8 kg)    Sexual History (orientation,birth control, marital status, STD):no need for STD screen, upcoming appt for mammogram. Up to date with PAP  Depression/Suicide: Depression screen [facility] 2/9 09/26/2020 09/26/2019 06/29/2018 02/23/2017 06/21/2015 06/19/2015 06/17/2015  Decreased Interest 1 0 0 0 0 0 0  Down, Depressed, Hopeless 1 0 0 0 0 0 0  PHQ - 2 Score 2 0 0 0 0 0 0  Altered sleeping 1 - - - - - -  Tired, decreased energy 1 - - - - - -  Change in appetite 1 - - - - - -  Feeling bad or failure about yourself  1 - - - - - -  Trouble concentrating 1 - - - - - -  Moving slowly or fidgety/restless 0 - - - - - -  Suicidal thoughts 0 - - - - - -  PHQ-9 Score 7 - - - - - -  Difficult doing work/chores Not difficult at all - - - - - -   No flowsheet data found.  Immunizations: (TDAP, Hep C screen, Pneumovax, Influenza, zoster)  Health Maintenance  Topic Date Due   Pneumococcal vaccine  Never done   Colon Cancer Screening  Never done   Complete foot exam   09/25/2019   COVID-19  Vaccine (3 - Booster for Pfizer series) 01/31/2020   Eye exam for diabetics  04/02/2020   Flu Shot  11/03/2020   Hemoglobin A1C  03/28/2021   Pap Smear  09/26/2022   Tetanus Vaccine  04/15/2027   Hepatitis C Screening: USPSTF Recommendation to screen - Ages 18-79 yo.  Completed   HIV Screening  Completed   Pneumococcal Vaccination  Aged Out   HPV Vaccine  Aged Out   Fall Risk: Fall Risk  09/26/2019 06/29/2018 06/15/2015 12/24/2014  Falls in the past year? 0 0 No No  Number falls in past yr: 0 - - -  Injury with Fall? 0 - - -  Follow up - Falls evaluation completed - -   Medications and allergies reviewed with patient and updated if appropriate.  Patient Active Problem List   Diagnosis Date Noted   GERD (gastroesophageal reflux disease) 09/25/2018   Visit for routine gyn exam 09/22/2018   DM (diabetes mellitus) ([place]) 04/06/2018   Frequent headaches 04/14/2017   S/P laparoscopic assisted vaginal hysterectomy (LAVH) 09/28/2016   Insomnia 02/18/2014   History of gestational diabetes 10/26/2012   HLD (hyperlipidemia) 10/23/2009   HYPERTENSION, BENIGN SYSTEMIC 06/02/2006    Current Outpatient Medications on File Prior to Visit  Medication  Sig Dispense Refill   methocarbamol (ROBAXIN) 500 MG tablet Take 1 tablet (500 mg total) by mouth every 8 (eight) hours as needed for muscle spasms. 21 tablet 0   No current facility-administered medications on file prior to visit.    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   CESAREAN SECTION  02-04-1999    vbac 1-06 and 05-19-2005   ERCP W/ SPHINCTEROTOMY AND BALLOON DILATION  10/11/2005   LAPAROSCOPIC CHOLECYSTECTOMY  10/04/2005   w/ ERCP   LAPAROSCOPIC VAGINAL HYSTERECTOMY WITH SALPINGO OOPHORECTOMY Left 09/28/2016   Procedure: LAPAROSCOPIC ASSISTED VAGINAL HYSTERECTOMY WITH SALPINGO OOPHORECTOMY, right salpingectomy;  Surgeon: Dian Queen, MD;  Location: [facility];  Service: Gynecology;  Laterality: Left;  NEED BED FOR OVERNIGHT STAY    Social History   Socioeconomic History   Marital status: Married    Spouse name: Not on file   Number of children: 3   Years of education: Not on file   Highest education level: Not on file  Occupational History   Occupation: math Product manager: [employer]  Tobacco Use   Smoking status: Never   Smokeless tobacco: Never  Vaping Use   Vaping Use: Never used  Substance and Sexual Activity   Alcohol use: No    Alcohol/week: 0.0 standard drinks   Drug use: No   Sexual activity: Yes    Partners: Male    Birth control/protection: Other-see comments    Comment:  husband - vasectomy  Other Topics Concern   Not on file  Social History Narrative   Married to [place], had son [name] 11-00, [name], and skylar   Social Determinants of Health   Financial Resource Strain: Not on file  Food Insecurity: Not on file  Transportation Needs: Not on file  Physical Activity: Not on file  Stress: Not on file  Social Connections: Not on file    Family History  Problem Relation Age of Onset   Diabetes Mother    Hypertension Mother    Breast cancer Maternal Grandmother    Cancer Maternal Grandmother        breast and lung        Review of Systems  Constitutional:  Negative for fever, malaise/fatigue and weight loss.  HENT:   Negative for congestion and sore throat.   Eyes:        Negative for visual changes  Respiratory:  Negative for cough and shortness of breath.   Cardiovascular:  Negative for chest pain, palpitations and leg swelling.  Gastrointestinal:  Negative for blood in stool, constipation, diarrhea and heartburn.  Genitourinary:  Negative for dysuria, frequency and urgency.  Musculoskeletal:  Negative for falls, joint pain and myalgias.  Skin:  Negative for rash.  Neurological:  Negative for  dizziness, sensory change and headaches.  Endo/Heme/Allergies:  Does not bruise/bleed easily.  Psychiatric/Behavioral:  Negative for depression, substance abuse and suicidal ideas. The patient is not nervous/anxious.    Objective:   Vitals:   09/26/20 0945  BP: (!) 150/100  Pulse: 74  Temp: (!) 97.1 F (36.2 C)  SpO2: 98%   Body mass index is 33.61 kg/m.  Physical Examination:  Physical Exam Constitutional:      General: She is not in acute distress. HENT:     Right Ear: Tympanic membrane, ear canal and external ear normal.     Left Ear: Tympanic membrane, ear canal and external ear normal.  Eyes:     Extraocular Movements: Extraocular movements intact.     Conjunctiva/sclera: Conjunctivae normal.  Cardiovascular:     Rate and Rhythm: Normal rate and regular rhythm.     Heart sounds: Normal heart sounds.  Pulmonary:     Effort: Pulmonary effort is normal. No respiratory distress.     Breath sounds: Normal breath sounds.  Abdominal:     General: There is no distension.     Palpations: Abdomen is soft.  Genitourinary:    Comments: Deferred breast and pelvic exam to GYN Musculoskeletal:        General: Normal range of motion.     Cervical back: Normal range of motion and neck supple.  Lymphadenopathy:     Cervical: No cervical adenopathy.  Skin:    General: Skin is warm and dry.  Neurological:     Mental Status: She is alert and oriented to person, place, and time.  Psychiatric:         Mood and Affect: Mood normal.        Behavior: Behavior normal.        Thought Content: Thought content normal.    ASSESSMENT and PLAN: This visit occurred during the SARS-CoV-2 public health emergency.  Safety protocols were in place, including screening questions prior to the visit, additional usage of staff PPE, and extensive cleaning of exam room while observing appropriate contact time as indicated for disinfecting solutions.   Sheenah was seen today for annual exam.  Diagnoses and all orders for this visit:  Encounter for preventative adult health care exam with abnormal findings -     Comprehensive metabolic panel -     CBC -     TSH -     Ambulatory referral to Gastroenterology  HYPERTENSION, BENIGN SYSTEMIC -     losartan (COZAAR) 25 MG tablet; Take 1 tablet (25 mg total) by mouth 2 (two) times daily.  Type 2 diabetes mellitus with hyperglycemia, without long-term current use of insulin (HCC) -     Hemoglobin A1c -     Microalbumin / creatinine urine ratio -     metFORMIN (GLUCOPHAGE XR) 750 MG 24 hr tablet; Take 1 tablet (750 mg total) by mouth daily with breakfast. -     glipiZIDE (GLUCOTROL XL) 5 MG 24 hr tablet; Take 1 tablet (5 mg total) by mouth daily with breakfast.  Hyperlipidemia, unspecified hyperlipidemia type -     Lipid panel -     rosuvastatin (CRESTOR) 10 MG tablet; Take 1 tablet (10 mg total) by mouth daily.  Colon cancer screening -     Ambulatory referral to Gastroenterology  Chronic diarrhea -     Ambulatory referral to Gastroenterology     Problem List Items Addressed This Visit       Cardiovascular and Mediastinum   HYPERTENSION, BENIGN  SYSTEMIC    BP not at goal BP Readings from Last 3 Encounters:  09/26/20 (!) 150/100  08/26/20 130/86  10/04/19 128/84   Increase losartan to 25mg  BID Advised about need to monitor BP at home, maintain DASH diet and start daily exercise. Repeat BMP F/up in 56month       Relevant Medications    losartan (COZAAR) 25 MG tablet   rosuvastatin (CRESTOR) 10 MG tablet     Endocrine   DM (diabetes mellitus) ([place])    Repeat hgbA1c: uncontrolled with A1c at 9.3 Increase metformin to 750mg  daily and add glipizide 5mg  daily F/up in 39months       Relevant Medications   losartan (COZAAR) 25 MG tablet   metFORMIN (GLUCOPHAGE XR) 750 MG 24 hr tablet   rosuvastatin (CRESTOR) 10 MG tablet   glipiZIDE (GLUCOTROL XL) 5 MG 24 hr tablet   Other Relevant Orders   Hemoglobin A1c (Completed)   Microalbumin / creatinine urine ratio (Completed)     Other   HLD (hyperlipidemia)    Repeat lipid panel: LDL not at goal Start crestor       Relevant Medications   losartan (COZAAR) 25 MG tablet   rosuvastatin (CRESTOR) 10 MG tablet   Other Relevant Orders   Lipid panel (Completed)   Other Visit Diagnoses     Encounter for preventative adult health care exam with abnormal findings    -  Primary   Relevant Orders   Comprehensive metabolic panel (Completed)   CBC (Completed)   TSH (Completed)   Ambulatory referral to Gastroenterology   Colon cancer screening       Relevant Orders   Ambulatory referral to Gastroenterology   Chronic diarrhea       Relevant Orders   Ambulatory referral to Gastroenterology       Follow up: Return in about 3 months (around 12/27/2020) for DM and HTN (16mins).  Wilfred Lacy, NP

## 2020-09-26 NOTE — Addendum Note (Signed)
Addended by: Wilfred Lacy L on: 09/26/2020 11:38 PM   Modules accepted: Orders

## 2020-11-11 ENCOUNTER — Encounter: Payer: Self-pay | Admitting: Nurse Practitioner

## 2021-01-20 ENCOUNTER — Other Ambulatory Visit: Payer: Self-pay | Admitting: Nurse Practitioner

## 2021-01-20 DIAGNOSIS — Z1231 Encounter for screening mammogram for malignant neoplasm of breast: Secondary | ICD-10-CM

## 2021-01-22 ENCOUNTER — Encounter: Payer: Self-pay | Admitting: Physician Assistant

## 2021-02-10 ENCOUNTER — Encounter: Payer: Self-pay | Admitting: Physician Assistant

## 2021-02-10 ENCOUNTER — Ambulatory Visit (INDEPENDENT_AMBULATORY_CARE_PROVIDER_SITE_OTHER): Payer: Commercial Managed Care - PPO | Admitting: Physician Assistant

## 2021-02-10 ENCOUNTER — Other Ambulatory Visit (INDEPENDENT_AMBULATORY_CARE_PROVIDER_SITE_OTHER): Payer: Commercial Managed Care - PPO

## 2021-02-10 VITALS — BP 130/90 | HR 90 | Ht 69.0 in | Wt 232.0 lb

## 2021-02-10 DIAGNOSIS — R195 Other fecal abnormalities: Secondary | ICD-10-CM

## 2021-02-10 DIAGNOSIS — D649 Anemia, unspecified: Secondary | ICD-10-CM

## 2021-02-10 DIAGNOSIS — R194 Change in bowel habit: Secondary | ICD-10-CM | POA: Diagnosis not present

## 2021-02-10 LAB — COMPREHENSIVE METABOLIC PANEL
ALT: 19 U/L (ref 0–35)
AST: 15 U/L (ref 0–37)
Albumin: 4.2 g/dL (ref 3.5–5.2)
Alkaline Phosphatase: 76 U/L (ref 39–117)
BUN: 14 mg/dL (ref 6–23)
CO2: 29 mEq/L (ref 19–32)
Calcium: 9.2 mg/dL (ref 8.4–10.5)
Chloride: 103 mEq/L (ref 96–112)
Creatinine, Ser: 0.93 mg/dL (ref 0.40–1.20)
GFR: 72.66 mL/min (ref >60.00)
Glucose, Bld: 138 mg/dL — ABNORMAL HIGH (ref 70–99)
Potassium: 4.4 mEq/L (ref 3.5–5.1)
Sodium: 139 mEq/L (ref 135–145)
Total Bilirubin: 0.4 mg/dL (ref 0.2–1.2)
Total Protein: 7.3 g/dL (ref 6.0–8.3)

## 2021-02-10 LAB — CBC WITH DIFFERENTIAL/PLATELET
Basophils Absolute: 0 10*3/uL (ref 0.0–0.1)
Basophils Relative: 0.6 % (ref 0.0–3.0)
Eosinophils Absolute: 0.1 10*3/uL (ref 0.0–0.7)
Eosinophils Relative: 1.5 % (ref 0.0–5.0)
HCT: 38 % (ref 36.0–46.0)
Hemoglobin: 12.4 g/dL (ref 12.0–15.0)
Lymphocytes Relative: 37.6 % (ref 12.0–46.0)
Lymphs Abs: 1.9 10*3/uL (ref 0.7–4.0)
MCHC: 32.8 g/dL (ref 30.0–36.0)
MCV: 89.4 fl (ref 78.0–100.0)
Monocytes Absolute: 0.3 10*3/uL (ref 0.1–1.0)
Monocytes Relative: 6 % (ref 3.0–12.0)
Neutro Abs: 2.8 10*3/uL (ref 1.4–7.7)
Neutrophils Relative %: 54.3 % (ref 43.0–77.0)
Platelets: 328 10*3/uL (ref 150.0–400.0)
RBC: 4.25 Mil/uL (ref 3.87–5.11)
RDW: 14.4 % (ref 11.5–15.5)
WBC: 5.2 10*3/uL (ref 4.0–10.5)

## 2021-02-10 LAB — IBC + FERRITIN
Ferritin: 61.8 ng/mL (ref 10.0–291.0)
Iron: 52 ug/dL (ref 42–145)
Saturation Ratios: 13.8 % — ABNORMAL LOW (ref 20.0–50.0)
TIBC: 376.6 ug/dL (ref 250.0–450.0)
Transferrin: 269 mg/dL (ref 212.0–360.0)

## 2021-02-10 MED ORDER — NA SULFATE-K SULFATE-MG SULF 17.5-3.13-1.6 GM/177ML PO SOLN: 1.0000 | Freq: Once | ORAL | 0 refills | Status: AC

## 2021-02-10 NOTE — Addendum Note (Signed)
Addended by: Tiana Loft on: 02/10/2021 10:48 AM   Modules accepted: Orders

## 2021-02-10 NOTE — Progress Notes (Signed)
Chief Complaint: Alternating bowel habits  HPI:    Barbara [name] is a 50 year old African-American female with a past medical history as listed below, who was referred to me by Nche, Charlene Brooke, NP for a complaint of alternating bowel habits.      09/26/2020 normal TSH, CBC with a hemoglobin of 11.9.  CMP with elevated glucose and otherwise normal.    Today, the patient presents to clinic and tells me that she has had some alternating stool habits ever since getting her gallbladder out in 2007.  Tells me that at first it was just a problem with diarrhea which is typically her more normal, but she will also have days of constipation.  Tells me her stools can vary in color.  Occasionally she will see light stools, then occasionally dark and even for about a week at a time saw "black tarry stool".  She has not seen this recently.  Denies any overt heartburn or reflux.  Tells me that she just wants to figure out what is going on.  Denies using Pepto-Bismol or iron supplements.    Also describes a history of anemia, there has never been a work-up for this.    Denies fever, chills, weight loss, bright red blood in her stool, family history of colon cancer or symptoms that awaken her from sleep.  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   CESAREAN SECTION  02-04-1999    vbac 1-06 and 05-19-2005    ERCP W/ SPHINCTEROTOMY AND BALLOON DILATION  10/11/2005   LAPAROSCOPIC CHOLECYSTECTOMY  10/04/2005   w/ ERCP   LAPAROSCOPIC VAGINAL HYSTERECTOMY WITH SALPINGO OOPHORECTOMY Left 09/28/2016   Procedure: LAPAROSCOPIC ASSISTED VAGINAL HYSTERECTOMY WITH SALPINGO OOPHORECTOMY, right salpingectomy;  Surgeon: Dian Queen, MD;  Location: [place];  Service: Gynecology;  Laterality: Left;  NEED BED FOR OVERNIGHT STAY    Current Outpatient Medications  Medication Sig Dispense Refill   glipiZIDE (GLUCOTROL XL) 5 MG 24 hr tablet Take 1 tablet (5 mg total) by mouth daily with breakfast. 90 tablet 3   losartan (COZAAR) 25 MG tablet Take 1 tablet (25 mg total) by mouth 2 (two) times daily. 180 tablet 3   metFORMIN (GLUCOPHAGE XR) 750 MG 24 hr tablet Take 1 tablet (750 mg total) by mouth daily with breakfast. 90 tablet 3   methocarbamol (ROBAXIN) 500 MG tablet Take 1 tablet (500 mg total) by mouth every 8 (eight) hours as needed for muscle spasms. 21 tablet 0   rosuvastatin (CRESTOR) 10 MG tablet Take 1 tablet (10 mg total) by mouth daily. 90 tablet 3   No current facility-administered medications for this visit.    Allergies as of 02/10/2021   (No Known Allergies)    Family History  Problem Relation Age of Onset  Diabetes Mother    Hypertension Mother    Breast cancer Maternal Grandmother    Cancer Maternal Grandmother        breast and lung    Social History   Socioeconomic History   Marital status: Married    Spouse name: Not on file   Number of children: 3   Years of education: Not on file   Highest education level: Not on file  Occupational History   Occupation: math Product manager: [employer]  Tobacco Use   Smoking status: Never   Smokeless tobacco: Never  Vaping Use   Vaping Use: Never used  Substance and Sexual Activity   Alcohol use: No    Alcohol/week: 0.0 standard drinks   Drug use: No   Sexual activity: Yes    Partners: Male     Birth control/protection: Other-see comments    Comment:  husband - vasectomy  Other Topics Concern   Not on file  Social History Narrative   Married to [place], had son [name] 11-00, [name], and skylar   Social Determinants of Health   Financial Resource Strain: Not on file  Food Insecurity: Not on file  Transportation Needs: Not on file  Physical Activity: Not on file  Stress: Not on file  Social Connections: Not on file  Intimate Partner Violence: Not on file    Review of Systems:    Constitutional: No weight loss, fever or chills Skin: No rash Cardiovascular: No chest pressure or palpitations   Respiratory: No SOB  Gastrointestinal: See HPI and otherwise negative Genitourinary: No dysuria  Neurological: No headache, dizziness or syncope Musculoskeletal: No new muscle or joint pain Hematologic: No bruising Psychiatric: No history of depression or anxiety   Physical Exam:  Vital signs: BP 130/90   Pulse 90   Ht 5\' 9"  (1.753 m)   Wt 232 lb (105.2 kg)   LMP 09/16/2016 (Approximate)   BMI 34.26 kg/m    Constitutional:   Pleasant overweight AA female appears to be in NAD, Well developed, Well nourished, alert and cooperative Head:  Normocephalic and atraumatic. Eyes:   PEERL, EOMI. No icterus. Conjunctiva pink. Ears:  Normal auditory acuity. Neck:  Supple Throat: Oral cavity and pharynx without inflammation, swelling or lesion.  Respiratory: Respirations even and unlabored. Lungs clear to auscultation bilaterally.   No wheezes, crackles, or rhonchi.  Cardiovascular: Normal S1, S2. No MRG. Regular rate and rhythm. No peripheral edema, cyanosis or pallor.  Gastrointestinal:  Soft, nondistended, nontender. No rebound or guarding. Normal bowel sounds. No appreciable masses or hepatomegaly. Rectal:  Not performed.  Msk:  Symmetrical without gross deformities. Without edema, no deformity or joint abnormality.  Neurologic:  Alert and  oriented x4;  grossly normal  neurologically.  Skin:   Dry and intact without significant lesions or rashes. Psychiatric:  Demonstrates good judgement and reason without abnormal affect or behaviors.  RELEVANT LABS AND IMAGING: CBC    Component Value Date/Time   WBC 5.5 09/26/2020 1044   RBC 4.01 09/26/2020 1044   HGB 11.9 (L) 09/26/2020 1044   HCT 35.6 (L) 09/26/2020 1044   PLT 340.0 09/26/2020 1044   MCV 88.8 09/26/2020 1044   MCV 83.9 06/15/2015 1301   MCH 28.5 09/29/2016 0548   MCHC 33.6 09/26/2020 1044   RDW 14.3 09/26/2020 1044   LYMPHSABS 1.7 02/21/2017 1004   MONOABS 0.3 02/21/2017 1004   EOSABS 0.1 02/21/2017 1004   BASOSABS 0.1 02/21/2017 1004    CMP  Component Value Date/Time   NA 139 09/26/2020 1044   NA 139 06/30/2018 0000   K 4.2 09/26/2020 1044   CL 101 09/26/2020 1044   CO2 29 09/26/2020 1044   GLUCOSE 168 (H) 09/26/2020 1044   BUN 13 09/26/2020 1044   BUN 13 06/30/2018 0000   CREATININE 0.84 09/26/2020 1044   CALCIUM 9.6 09/26/2020 1044   PROT 7.4 09/26/2020 1044   ALBUMIN 4.4 09/26/2020 1044   AST 14 09/26/2020 1044   ALT 19 09/26/2020 1044   ALKPHOS 88 09/26/2020 1044   BILITOT 0.4 09/26/2020 1044   GFRNONAA 95.36 10/20/2009 0935    Assessment: 1.  Anemia: Hemoglobin has been minimally decreased since June 2021, patient reports history of iron deficiency anemia with no work-up; consider GI source 2.  Dark stool: Describes a history of a tarry dark stool a few weeks back, seen no further, no overlying heartburn/reflux symptoms ; consider upper GI bleed less likely than diet/food related bowel change  3.  Variance in bowel habits: Diarrhea to constipation, worse after cholecystectomy; consider bile salt induced diarrhea worsened with diet versus IBS  Plan: 1.  We will repeat CBC, CMP and iron studies today with ferritin. 2.  Scheduled patient for diagnostic EGD and colonoscopy in the [place] given anemia and description of tarry stools.  These procedures were scheduled with Dr.  Lorenso Courier.  Did provide the patient a detailed list of risks for the procedures and she agrees to proceed. 3.  Encouraged the patient to start a fiber supplement.  We discussed the various forms of this. 4.  Patient to follow in clinic per recommendations after time of procedures.  Dr. Lorenso Courier will be her primary GI physician.  Barbara Newer, PA-C [facility] 02/10/2021, 10:14 AM  Cc: Flossie Buffy, NP

## 2021-02-10 NOTE — Patient Instructions (Signed)
Your provider has requested that you go to the basement level for lab work before leaving today. Press "B" on the elevator. The lab is located at the first door on the left as you exit the elevator.  You have been scheduled for an endoscopy and colonoscopy. Please follow the written instructions given to you at your visit today. Please pick up your prep supplies at the pharmacy within the next 1-3 days. If you use inhalers (even only as needed), please bring them with you on the day of your procedure.  If you are age 65 or older, your body mass index should be between 23-30. Your Body mass index is 34.26 kg/m. If this is out of the aforementioned range listed, please consider follow up with your Primary Care Provider.  If you are age 38 or younger, your body mass index should be between 19-25. Your Body mass index is 34.26 kg/m. If this is out of the aformentioned range listed, please consider follow up with your Primary Care Provider.   ________________________________________________________  The [facility] providers would like to encourage you to use [facility] to communicate with providers for non-urgent requests or questions.  Due to long hold times on the telephone, sending your provider a message by [facility] may be a faster and more efficient way to get a response.  Please allow 48 business hours for a response.  Please remember that this is for non-urgent requests.  _______________________________________________________

## 2021-02-11 NOTE — Progress Notes (Signed)
I agree with the assessment and plan as outlined by Ms. Lemmon. 

## 2021-02-19 ENCOUNTER — Encounter: Payer: Self-pay | Admitting: Nurse Practitioner

## 2021-02-19 ENCOUNTER — Ambulatory Visit (INDEPENDENT_AMBULATORY_CARE_PROVIDER_SITE_OTHER): Payer: Commercial Managed Care - PPO | Admitting: Nurse Practitioner

## 2021-02-19 ENCOUNTER — Other Ambulatory Visit: Payer: Self-pay

## 2021-02-19 VITALS — BP 120/90 | HR 78 | Temp 96.7°F | Wt 235.6 lb

## 2021-02-19 DIAGNOSIS — G44209 Tension-type headache, unspecified, not intractable: Secondary | ICD-10-CM | POA: Diagnosis not present

## 2021-02-19 DIAGNOSIS — M62838 Other muscle spasm: Secondary | ICD-10-CM | POA: Diagnosis not present

## 2021-02-19 NOTE — Progress Notes (Signed)
Subjective:  Patient ID: Barbara [name], female    DOB: [birth year]  Age: 50 y.o. MRN: [id number]  CC: Acute Visit (Pt c/o states she has pain that travels up her left arm, side of the neck and behind her left eye 1-2 weeks. Pt states the pain comes and goes, but she notices it more only when driving and sitting for long periods of time. )  Headache  This is a new problem. The current episode started 1 to 4 weeks ago. The problem occurs intermittently. The problem has been waxing and waning. The pain is located in the Left unilateral and occipital region. The pain radiates to the left neck and left arm. The pain quality is not similar to prior headaches. The quality of the pain is described as aching, dull and throbbing. The pain is moderate. Associated symptoms include muscle aches and neck pain. Pertinent negatives include no abdominal pain, abnormal behavior, anorexia, back pain, blurred vision, coughing, dizziness, drainage, ear pain, eye pain, eye redness, eye watering, facial sweating, fever, hearing loss, insomnia, loss of balance, nausea, numbness, phonophobia, photophobia, rhinorrhea, scalp tenderness, seizures, sinus pressure, sore throat, swollen glands, tingling, tinnitus, visual change, vomiting, weakness or weight loss. Exacerbated by: certain neck position sitting or at bedtime. She has tried NSAIDs for the symptoms. The treatment provided significant relief. Her past medical history is significant for hypertension and obesity. There is no history of cancer, cluster headaches, immunosuppression, migraine headaches, migraines in the family, pseudotumor cerebri, recent head traumas, sinus disease or TMJ.  Has upcoming eye exam 03/2021. No head injury.  Reviewed past Medical, Social and Family history today.  Outpatient Medications Prior to Visit  Medication Sig Dispense Refill   glipiZIDE (GLUCOTROL XL) 5 MG 24 hr tablet Take 1 tablet (5 mg total) by mouth daily with breakfast. 90  tablet 3   losartan (COZAAR) 25 MG tablet Take 1 tablet (25 mg total) by mouth 2 (two) times daily. 180 tablet 3   metFORMIN (GLUCOPHAGE XR) 750 MG 24 hr tablet Take 1 tablet (750 mg total) by mouth daily with breakfast. 90 tablet 3   methocarbamol (ROBAXIN) 500 MG tablet Take 500 mg by mouth at bedtime as needed.     rosuvastatin (CRESTOR) 10 MG tablet Take 10 mg by mouth daily.     No facility-administered medications prior to visit.   ROS See HPI  Objective:  BP 120/90 (BP Location: Left Arm, Patient Position: Sitting, Cuff Size: Large)   Pulse 78   Temp (!) 96.7 F (35.9 C) (Temporal)   Wt 235 lb 9.6 oz (106.9 kg)   LMP 09/16/2016 (Approximate)   SpO2 98%   BMI 34.79 kg/m   Physical Exam Vitals reviewed.  Constitutional:      General: She is not in acute distress. Eyes:     Extraocular Movements: Extraocular movements intact.     Conjunctiva/sclera: Conjunctivae normal.     Pupils: Pupils are equal, round, and reactive to light.  Neck:     Thyroid: No thyroid mass, thyromegaly or thyroid tenderness.  Cardiovascular:     Rate and Rhythm: Normal rate.     Pulses: Normal pulses.  Pulmonary:     Effort: Pulmonary effort is normal.  Musculoskeletal:     Cervical back: Normal range of motion and neck supple. Tenderness present. No torticollis. Pain with movement and muscular tenderness present. Normal range of motion.  Lymphadenopathy:     Cervical: No cervical adenopathy.  Neurological:     Mental  Status: She is alert and oriented to person, place, and time.     Cranial Nerves: No cranial nerve deficit.  Psychiatric:        Mood and Affect: Mood normal.        Behavior: Behavior normal.        Thought Content: Thought content normal.   Assessment & Plan:  This visit occurred during the SARS-CoV-2 public health emergency.  Safety protocols were in place, including screening questions prior to the visit, additional usage of staff PPE, and extensive cleaning of exam room  while observing appropriate contact time as indicated for disinfecting solutions.   Barbara [name] was seen today for acute visit.  Diagnoses and all orders for this visit:  Acute non intractable tension-type headache  Trapezius muscle spasm  Alternate between warm and cold compress as needed Start neck and shoulder stretching exercise. Alternate between tylenol and ibuprofen as needed for pain. Take robaxin at bedtime. Take in daytime if you are not driving. Use pillow with neck support at bedtime. Call office if no improvement in 1-2weeks  Problem List Items Addressed This Visit   None Visit Diagnoses     Acute non intractable tension-type headache    -  Primary   Relevant Medications   methocarbamol (ROBAXIN) 500 MG tablet   Trapezius muscle spasm           Follow-up: No follow-ups on file.  Wilfred Lacy, NP

## 2021-02-19 NOTE — Patient Instructions (Signed)
Alternate between warm and cold compress as needed Start neck and shoulder stretching exercise. Alternate between tylenol and ibuprofen as needed for pain. Take robaxin at bedtime. Take in daytime if you are not driving. Use pillow with neck support at bedtime. Call office if no improvement in 1-2weeks.  Neck Exercises Ask your health care provider which exercises are safe for you. Do exercises exactly as told by your health care provider and adjust them as directed. It is normal to feel mild stretching, pulling, tightness, or discomfort as you do these exercises. Stop right away if you feel sudden pain or your pain gets worse. Do not begin these exercises until told by your health care provider. Neck exercises can be important for many reasons. They can improve strength and maintain flexibility in your neck, which will help your upper back and prevent neck pain. Stretching exercises Rotation neck stretching  Sit in a chair or stand up. Place your feet flat on the floor, shoulder-width apart. Slowly turn your head (rotate) to the right until a slight stretch is felt. Turn it all the way to the right so you can look over your right shoulder. Do not tilt or tip your head. Hold this position for 10-30 seconds. Slowly turn your head (rotate) to the left until a slight stretch is felt. Turn it all the way to the left so you can look over your left shoulder. Do not tilt or tip your head. Hold this position for 10-30 seconds. Repeat __________ times. Complete this exercise __________ times a day. Neck retraction  Sit in a sturdy chair or stand up. Look straight ahead. Do not bend your neck. Use your fingers to push your chin backward (retraction). Do not bend your neck for this movement. Continue to face straight ahead. If you are doing the exercise properly, you will feel a slight sensation in your throat and a stretch at the back of your neck. Hold the stretch for 1-2 seconds. Repeat __________  times. Complete this exercise __________ times a day. Strengthening exercises Neck press  Lie on your back on a firm bed or on the floor with a pillow under your head. Use your neck muscles to push your head down on the pillow and straighten your spine. Hold the position as well as you can. Keep your head facing up (in a neutral position) and your chin tucked. Slowly count to 5 while holding this position. Repeat __________ times. Complete this exercise __________ times a day. Isometrics These are exercises in which you strengthen the muscles in your neck while keeping your neck still (isometrics). Sit in a supportive chair and place your hand on your forehead. Keep your head and face facing straight ahead. Do not flex or extend your neck while doing isometrics. Push forward with your head and neck while pushing back with your hand. Hold for 10 seconds. Do the sequence again, this time putting your hand against the back of your head. Use your head and neck to push backward against the hand pressure. Finally, do the same exercise on either side of your head, pushing sideways against the pressure of your hand. Repeat __________ times. Complete this exercise __________ times a day. Prone head lifts  Lie face-down (prone position), resting on your elbows so that your chest and upper back are raised. Start with your head facing downward, near your chest. Position your chin either on or near your chest. Slowly lift your head upward. Lift until you are looking straight ahead. Then  continue lifting your head as far back as you can comfortably stretch. Hold your head up for 5 seconds. Then slowly lower it to your starting position. Repeat __________ times. Complete this exercise __________ times a day. Supine head lifts  Lie on your back (supine position), bending your knees to point to the ceiling and keeping your feet flat on the floor. Lift your head slowly off the floor, raising your chin  toward your chest. Hold for 5 seconds. Repeat __________ times. Complete this exercise __________ times a day. Scapular retraction  Stand with your arms at your sides. Look straight ahead. Slowly pull both shoulders (scapulae) backward and downward (retraction) until you feel a stretch between your shoulder blades in your upper back. Hold for 10-30 seconds. Relax and repeat. Repeat __________ times. Complete this exercise __________ times a day. Contact a health care provider if: Your neck pain or discomfort gets worse when you do an exercise. Your neck pain or discomfort does not improve within 2 hours after you exercise. If you have any of these problems, stop exercising right away. Do not do the exercises again unless your health care provider says that you can. Get help right away if: You develop sudden, severe neck pain. If this happens, stop exercising right away. Do not do the exercises again unless your health care provider says that you can. This information is not intended to replace advice given to you by your health care provider. Make sure you discuss any questions you have with your health care provider. Document Revised: 09/16/2020 Document Reviewed: 09/16/2020 Elsevier Patient Education  [place].  Shoulder Exercises Ask your health care provider which exercises are safe for you. Do exercises exactly as told by your health care provider and adjust them as directed. It is normal to feel mild stretching, pulling, tightness, or discomfort as you do these exercises. Stop right away if you feel sudden pain or your pain gets worse. Do not begin these exercises until told by your health care provider. Stretching exercises External rotation and abduction This exercise is sometimes called corner stretch. This exercise rotates your arm outward (external rotation) and moves your arm out from your body (abduction). Stand in a doorway with one of your feet slightly in front of  the other. This is called a staggered stance. If you cannot reach your forearms to the door frame, stand facing a corner of a room. Choose one of the following positions as told by your health care provider: Place your hands and forearms on the door frame above your head. Place your hands and forearms on the door frame at the height of your head. Place your hands on the door frame at the height of your elbows. Slowly move your weight onto your front foot until you feel a stretch across your chest and in the front of your shoulders. Keep your head and chest upright and keep your abdominal muscles tight. Hold for __________ seconds. To release the stretch, shift your weight to your back foot. Repeat __________ times. Complete this exercise __________ times a day. Extension, standing Stand and hold a broomstick, a cane, or a similar object behind your back. Your hands should be a little wider than shoulder width apart. Your palms should face away from your back. Keeping your elbows straight and your shoulder muscles relaxed, move the stick away from your body until you feel a stretch in your shoulders (extension). Avoid shrugging your shoulders while you move the stick. Keep your shoulder  blades tucked down toward the middle of your back. Hold for __________ seconds. Slowly return to the starting position. Repeat __________ times. Complete this exercise __________ times a day. Range-of-motion exercises Pendulum  Stand near a wall or a surface that you can hold onto for balance. Bend at the waist and let your left / right arm hang straight down. Use your other arm to support you. Keep your back straight and do not lock your knees. Relax your left / right arm and shoulder muscles, and move your hips and your trunk so your left / right arm swings freely. Your arm should swing because of the motion of your body, not because you are using your arm or shoulder muscles. Keep moving your hips and trunk  so your arm swings in the following directions, as told by your health care provider: Side to side. Forward and backward. In clockwise and counterclockwise circles. Continue each motion for __________ seconds, or for as long as told by your health care provider. Slowly return to the starting position. Repeat __________ times. Complete this exercise __________ times a day. Shoulder flexion, standing  Stand and hold a broomstick, a cane, or a similar object. Place your hands a little more than shoulder width apart on the object. Your left / right hand should be palm up, and your other hand should be palm down. Keep your elbow straight and your shoulder muscles relaxed. Push the stick up with your healthy arm to raise your left / right arm in front of your body, and then over your head until you feel a stretch in your shoulder (flexion). Avoid shrugging your shoulder while you raise your arm. Keep your shoulder blade tucked down toward the middle of your back. Hold for __________ seconds. Slowly return to the starting position. Repeat __________ times. Complete this exercise __________ times a day. Shoulder abduction, standing Stand and hold a broomstick, a cane, or a similar object. Place your hands a little more than shoulder width apart on the object. Your left / right hand should be palm up, and your other hand should be palm down. Keep your elbow straight and your shoulder muscles relaxed. Push the object across your body toward your left / right side. Raise your left / right arm to the side of your body (abduction) until you feel a stretch in your shoulder. Do not raise your arm above shoulder height unless your health care provider tells you to do that. If directed, raise your arm over your head. Avoid shrugging your shoulder while you raise your arm. Keep your shoulder blade tucked down toward the middle of your back. Hold for __________ seconds. Slowly return to the starting  position. Repeat __________ times. Complete this exercise __________ times a day. Internal rotation  Place your left / right hand behind your back, palm up. Use your other hand to dangle an exercise band, a towel, or a similar object over your shoulder. Grasp the band with your left / right hand so you are holding on to both ends. Gently pull up on the band until you feel a stretch in the front of your left / right shoulder. The movement of your arm toward the center of your body is called internal rotation. Avoid shrugging your shoulder while you raise your arm. Keep your shoulder blade tucked down toward the middle of your back. Hold for __________ seconds. Release the stretch by letting go of the band and lowering your hands. Repeat __________ times. Complete this exercise  __________ times a day. Strengthening exercises External rotation  Sit in a stable chair without armrests. Secure an exercise band to a stable object at elbow height on your left / right side. Place a soft object, such as a folded towel or a small pillow, between your left / right upper arm and your body to move your elbow about 4 inches (10 cm) away from your side. Hold the end of the exercise band so it is tight and there is no slack. Keeping your elbow pressed against the soft object, slowly move your forearm out, away from your abdomen (external rotation). Keep your body steady so only your forearm moves. Hold for __________ seconds. Slowly return to the starting position. Repeat __________ times. Complete this exercise __________ times a day. Shoulder abduction  Sit in a stable chair without armrests, or stand up. Hold a __________ weight in your left / right hand, or hold an exercise band with both hands. Start with your arms straight down and your left / right palm facing in, toward your body. Slowly lift your left / right hand out to your side (abduction). Do not lift your hand above shoulder height unless  your health care provider tells you that this is safe. Keep your arms straight. Avoid shrugging your shoulder while you do this movement. Keep your shoulder blade tucked down toward the middle of your back. Hold for __________ seconds. Slowly lower your arm, and return to the starting position. Repeat __________ times. Complete this exercise __________ times a day. Shoulder extension Sit in a stable chair without armrests, or stand up. Secure an exercise band to a stable object in front of you so it is at shoulder height. Hold one end of the exercise band in each hand. Your palms should face each other. Straighten your elbows and lift your hands up to shoulder height. Step back, away from the secured end of the exercise band, until the band is tight and there is no slack. Squeeze your shoulder blades together as you pull your hands down to the sides of your thighs (extension). Stop when your hands are straight down by your sides. Do not let your hands go behind your body. Hold for __________ seconds. Slowly return to the starting position. Repeat __________ times. Complete this exercise __________ times a day. Shoulder row Sit in a stable chair without armrests, or stand up. Secure an exercise band to a stable object in front of you so it is at waist height. Hold one end of the exercise band in each hand. Position your palms so that your thumbs are facing the ceiling (neutral position). Bend each of your elbows to a 90-degree angle (right angle) and keep your upper arms at your sides. Step back until the band is tight and there is no slack. Slowly pull your elbows back behind you. Hold for __________ seconds. Slowly return to the starting position. Repeat __________ times. Complete this exercise __________ times a day. Shoulder press-ups  Sit in a stable chair that has armrests. Sit upright, with your feet flat on the floor. Put your hands on the armrests so your elbows are bent and your  fingers are pointing forward. Your hands should be about even with the sides of your body. Push down on the armrests and use your arms to lift yourself off the chair. Straighten your elbows and lift yourself up as much as you comfortably can. Move your shoulder blades down, and avoid letting your shoulders move up toward your  ears. Keep your feet on the ground. As you get stronger, your feet should support less of your body weight as you lift yourself up. Hold for __________ seconds. Slowly lower yourself back into the chair. Repeat __________ times. Complete this exercise __________ times a day. Wall push-ups  Stand so you are facing a stable wall. Your feet should be about one arm-length away from the wall. Lean forward and place your palms on the wall at shoulder height. Keep your feet flat on the floor as you bend your elbows and lean forward toward the wall. Hold for __________ seconds. Straighten your elbows to push yourself back to the starting position. Repeat __________ times. Complete this exercise __________ times a day. This information is not intended to replace advice given to you by your health care provider. Make sure you discuss any questions you have with your health care provider. Document Revised: 07/14/2018 Document Reviewed: 04/21/2018 Elsevier Patient Education  [place].

## 2021-02-25 ENCOUNTER — Other Ambulatory Visit: Payer: Self-pay

## 2021-02-25 ENCOUNTER — Ambulatory Visit
Admission: RE | Admit: 2021-02-25 | Discharge: 2021-02-25 | Disposition: A | Discharge status: Home / Self Care | Payer: Commercial Managed Care - PPO | Source: Ambulatory Visit | Attending: Nurse Practitioner | Admitting: Nurse Practitioner

## 2021-02-25 DIAGNOSIS — Z1231 Encounter for screening mammogram for malignant neoplasm of breast: Secondary | ICD-10-CM

## 2021-03-30 ENCOUNTER — Telehealth: Payer: Self-pay | Admitting: Nurse Practitioner

## 2021-03-30 NOTE — Telephone Encounter (Signed)
Barbara [name], patient called our on-call service on 12/26, She does not have her bowel prep Rx.  pls send in RX for what bowel prep the patient was prescribed to her pharmacy ASAP 03/31/2021 as she needs to start her bowel prep for her colonoscopy scheduled 04/01/2021.

## 2021-03-31 ENCOUNTER — Other Ambulatory Visit: Payer: Self-pay

## 2021-03-31 ENCOUNTER — Encounter: Payer: Self-pay | Admitting: Internal Medicine

## 2021-03-31 ENCOUNTER — Telehealth: Payer: Self-pay | Admitting: Physician Assistant

## 2021-03-31 NOTE — Telephone Encounter (Signed)
Patient called again this morning regarding not having her prep.  She also wanted you to know that she had a salad and a hotdog for lunch yesterday and some pulp-free orange juice on Sunday.  Please reach out to patient and let her know if this is OK.  She also said she doesn't have her instructions for how to do the prep.  Thank you.

## 2021-03-31 NOTE — Telephone Encounter (Signed)
Spoke with patient and reschedule appointment to 04/21/21. Sent new instructions via mychart.

## 2021-03-31 NOTE — Telephone Encounter (Signed)
Informed patient that Suprep was sent to the pharmacy at her visit on 02/10/21, she will call pharmacy to have that filled. If any problems she will call back or send a mychart message. Informed patient her prep instructions are available on mychart under letters.

## 2021-04-01 ENCOUNTER — Encounter: Payer: Commercial Managed Care - PPO | Admitting: Internal Medicine

## 2021-04-21 ENCOUNTER — Ambulatory Visit (AMBULATORY_SURGERY_CENTER): Payer: Commercial Managed Care - PPO | Admitting: Internal Medicine

## 2021-04-21 ENCOUNTER — Other Ambulatory Visit: Payer: Self-pay

## 2021-04-21 ENCOUNTER — Encounter: Payer: Self-pay | Admitting: Internal Medicine

## 2021-04-21 VITALS — BP 146/97 | HR 73 | Temp 96.8°F | Resp 13 | Ht 69.0 in | Wt 232.0 lb

## 2021-04-21 DIAGNOSIS — K2970 Gastritis, unspecified, without bleeding: Secondary | ICD-10-CM | POA: Diagnosis not present

## 2021-04-21 DIAGNOSIS — K5730 Diverticulosis of large intestine without perforation or abscess without bleeding: Secondary | ICD-10-CM

## 2021-04-21 DIAGNOSIS — K648 Other hemorrhoids: Secondary | ICD-10-CM

## 2021-04-21 DIAGNOSIS — K635 Polyp of colon: Secondary | ICD-10-CM | POA: Diagnosis not present

## 2021-04-21 DIAGNOSIS — D123 Benign neoplasm of transverse colon: Secondary | ICD-10-CM

## 2021-04-21 DIAGNOSIS — R195 Other fecal abnormalities: Secondary | ICD-10-CM

## 2021-04-21 DIAGNOSIS — K2980 Duodenitis without bleeding: Secondary | ICD-10-CM | POA: Diagnosis not present

## 2021-04-21 DIAGNOSIS — K2950 Unspecified chronic gastritis without bleeding: Secondary | ICD-10-CM | POA: Diagnosis not present

## 2021-04-21 DIAGNOSIS — D649 Anemia, unspecified: Secondary | ICD-10-CM

## 2021-04-21 DIAGNOSIS — R194 Change in bowel habit: Secondary | ICD-10-CM

## 2021-04-21 MED ORDER — SODIUM CHLORIDE 0.9 % IV SOLN: 500.0000 mL | Freq: Once | INTRAVENOUS | Status: DC

## 2021-04-21 NOTE — Op Note (Signed)
[facility] Patient Name: Barbara [name] Procedure Date: 04/21/2021 10:42 AM MRN: [id number] Endoscopist: Sonny Masters "[name] ,  Age: 51 Referring MD:  Date of Birth: 05-04-1970 Gender: Female Account #: [id number] Procedure:                Colonoscopy Indications:              Screening for colorectal malignant neoplasm,                            Incidental - Anemia Medicines:                Monitored Anesthesia Care Procedure:                Pre-Anesthesia Assessment:                           - Prior to the procedure, a History and Physical                            was performed, and patient medications and                            allergies were reviewed. The patient's tolerance of                            previous anesthesia was also reviewed. The risks                            and benefits of the procedure and the sedation                            options and risks were discussed with the patient.                            All questions were answered, and informed consent                            was obtained. Prior Anticoagulants: The patient has                            taken no previous anticoagulant or antiplatelet                            agents. ASA Grade Assessment: III - A patient with                            severe systemic disease. After reviewing the risks                            and benefits, the patient was deemed in                            satisfactory condition to undergo the procedure.  After obtaining informed consent, the colonoscope                            was passed under direct vision. Throughout the                            procedure, the patient's blood pressure, pulse, and                            oxygen saturations were monitored continuously. The                            Olympus CF-HQ190L [id number]) Colonoscope was                            introduced through the anus and  advanced to the the                            terminal ileum. The colonoscopy was performed                            without difficulty. The patient tolerated the                            procedure well. The quality of the bowel                            preparation was good. The terminal ileum, ileocecal                            valve, appendiceal orifice, and rectum were                            photographed. Scope In: 11:01:43 AM Scope Out: 11:12:40 AM Scope Withdrawal Time: 0 hours 8 minutes 49 seconds  Total Procedure Duration: 0 hours 10 minutes 57 seconds  Findings:                 The terminal ileum appeared normal.                           A 3 mm polyp was found in the transverse colon. The                            polyp was sessile. The polyp was removed with a                            cold snare. Resection and retrieval were complete.                           A few diverticula were found in the sigmoid colon.                           Non-bleeding internal hemorrhoids were found during  retroflexion. Complications:            No immediate complications. Estimated Blood Loss:     Estimated blood loss was minimal. Impression:               - The examined portion of the ileum was normal.                           - One 3 mm polyp in the transverse colon, removed                            with a cold snare. Resected and retrieved.                           - Diverticulosis in the sigmoid colon.                           - Non-bleeding internal hemorrhoids. Recommendation:           - Discharge patient to home (with escort).                           - No obvious cause of dark stools or anemia was                            found. Patient's last hemoglobin looks normal.                           - Await pathology results.                           - The findings and recommendations were discussed                            with the  patient. Sonny Masters "[name],  04/21/2021 11:19:22 AM

## 2021-04-21 NOTE — Patient Instructions (Signed)
HAnd out on Hemorrhoids, Diverticulosis, and polyls provided.  Await pathology results.   YOU HAD AN ENDOSCOPIC PROCEDURE TODAY AT [facility]:   Refer to the procedure report that was given to you for any specific questions about what was found during the examination.  If the procedure report does not answer your questions, please call your gastroenterologist to clarify.  If you requested that your care partner not be given the details of your procedure findings, then the procedure report has been included in a sealed envelope for you to review at your convenience later.  YOU SHOULD EXPECT: Some feelings of bloating in the abdomen. Passage of more gas than usual.  Walking can help get rid of the air that was put into your GI tract during the procedure and reduce the bloating. If you had a lower endoscopy (such as a colonoscopy or flexible sigmoidoscopy) you may notice spotting of blood in your stool or on the toilet paper. If you underwent a bowel prep for your procedure, you may not have a normal bowel movement for a few days.  Please Note:  You might notice some irritation and congestion in your nose or some drainage.  This is from the oxygen used during your procedure.  There is no need for concern and it should clear up in a day or so.  SYMPTOMS TO REPORT IMMEDIATELY:  Following lower endoscopy (colonoscopy or flexible sigmoidoscopy):  Excessive amounts of blood in the stool  Significant tenderness or worsening of abdominal pains  Swelling of the abdomen that is new, acute  Fever of 100F or higher  Following upper endoscopy (EGD)  Vomiting of blood or coffee ground material  New chest pain or pain under the shoulder blades  Painful or persistently difficult swallowing  New shortness of breath  Fever of 100F or higher  Black, tarry-looking stools  For urgent or emergent issues, a gastroenterologist can be reached at any hour by calling [phone]. Do not use  MyChart messaging for urgent concerns.    DIET:  We do recommend a small meal at first, but then you may proceed to your regular diet.  Drink plenty of fluids but you should avoid alcoholic beverages for 24 hours.  ACTIVITY:  You should plan to take it easy for the rest of today and you should NOT DRIVE or use heavy machinery until tomorrow (because of the sedation medicines used during the test).    FOLLOW UP: Our staff will call the number listed on your records 48-72 hours following your procedure to check on you and address any questions or concerns that you may have regarding the information given to you following your procedure. If we do not reach you, we will leave a message.  We will attempt to reach you two times.  During this call, we will ask if you have developed any symptoms of COVID 19. If you develop any symptoms (ie: fever, flu-like symptoms, shortness of breath, cough etc.) before then, please call [phone].  If you test positive for Covid 19 in the 2 weeks post procedure, please call and report this information to [place].    If any biopsies were taken you will be contacted by phone or by letter within the next 1-3 weeks.  Please call us at [phone] if you have not heard about the biopsies in 3 weeks.    SIGNATURES/CONFIDENTIALITY: You and/or your care partner have signed paperwork which will be entered into your electronic medical record.  These signatures attest to the fact that that the information above on your After Visit Summary has been reviewed and is understood.  Full responsibility of the confidentiality of this discharge information lies with you and/or your care-partner.

## 2021-04-21 NOTE — Op Note (Signed)
[facility] Patient Name: Rutha Melgoza Procedure Date: 04/21/2021 10:43 AM MRN: [id number] Endoscopist: Sonny Masters "Barbara [name] ,  Age: 51 Referring MD:  Date of Birth: [birth year] Gender: Female Account #: [id number] Procedure:                Upper GI endoscopy Indications:              Anemia Medicines:                Monitored Anesthesia Care Procedure:                Pre-Anesthesia Assessment:                           - Prior to the procedure, a History and Physical                            was performed, and patient medications and                            allergies were reviewed. The patient's tolerance of                            previous anesthesia was also reviewed. The risks                            and benefits of the procedure and the sedation                            options and risks were discussed with the patient.                            All questions were answered, and informed consent                            was obtained. Prior Anticoagulants: The patient has                            taken no previous anticoagulant or antiplatelet                            agents. ASA Grade Assessment: III - A patient with                            severe systemic disease. After reviewing the risks                            and benefits, the patient was deemed in                            satisfactory condition to undergo the procedure.                           After obtaining informed consent, the endoscope was  passed under direct vision. Throughout the                            procedure, the patient's blood pressure, pulse, and                            oxygen saturations were monitored continuously. The                            GIF [id number] #[id number] was introduced through the                            mouth, and advanced to the second part of duodenum.                            The upper GI endoscopy was  accomplished without                            difficulty. The patient tolerated the procedure                            well. Scope In: Scope Out: Findings:                 The examined esophagus was normal.                           Localized mild inflammation characterized by                            congestion (edema) and erythema was found in the                            gastric antrum. Biopsies were taken with a cold                            forceps for histology.                           The examined duodenum was normal. Biopsies were                            taken with a cold forceps for histology. Complications:            No immediate complications. Estimated Blood Loss:     Estimated blood loss was minimal. Impression:               - Normal esophagus.                           - Gastritis. Biopsied.                           - Normal examined duodenum. Biopsied. Recommendation:           - Await pathology results.                           -  Perform a colonoscopy today. Sonny Masters "Barbara [name],  04/21/2021 11:16:39 AM

## 2021-04-21 NOTE — Progress Notes (Signed)
Pt's states no medical or surgical changes since previsit or office visit. VS assessed by D.T 

## 2021-04-21 NOTE — Progress Notes (Signed)
A and O x3. Report to RN. Tolerated MAC anesthesia well.Teeth unchanged after procedure. 

## 2021-04-21 NOTE — Progress Notes (Signed)
GASTROENTEROLOGY PROCEDURE H&P NOTE   Primary Care Physician: Flossie Buffy, NP    Reason for Procedure:   Dark stools, anemia, change in bowel habits, colon cancer screening  Plan:    EGD/colonoscopy  Patient is appropriate for endoscopic procedure(s) in the ambulatory ([facility]) setting.  The nature of the procedure, as well as the risks, benefits, and alternatives were carefully and thoroughly reviewed with the patient. Ample time for discussion and questions allowed. The patient understood, was satisfied, and agreed to proceed.     HPI: Barbara [name] is a 51 y.o. female who presents for EGD/colonoscopy for evaluation of anemia, dark stools, colon cancer screening .  Patient was most recently seen in the Gastroenterology Clinic on 02/10/21.  No interval change in medical history since that appointment. Please refer to that note for full details regarding GI history and clinical presentation.   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   CESAREAN SECTION  02-04-1999    vbac 1-06 and 05-19-2005   ERCP W/ SPHINCTEROTOMY AND BALLOON DILATION  10/11/2005   LAPAROSCOPIC CHOLECYSTECTOMY  10/04/2005   w/ ERCP   LAPAROSCOPIC VAGINAL HYSTERECTOMY WITH SALPINGO OOPHORECTOMY Left 09/28/2016   Procedure: LAPAROSCOPIC ASSISTED VAGINAL HYSTERECTOMY WITH SALPINGO OOPHORECTOMY,  right salpingectomy;  Surgeon: Dian Queen, MD;  Location: [facility];  Service: Gynecology;  Laterality: Left;  NEED BED FOR OVERNIGHT STAY    Prior to Admission medications   Medication Sig Start Date End Date Taking? Authorizing Provider  glipiZIDE (GLUCOTROL XL) 5 MG 24 hr tablet Take 1 tablet (5 mg total) by mouth daily with breakfast. 09/26/20  Yes Nche, Charlene Brooke, NP  losartan (COZAAR) 25 MG tablet Take 1 tablet (25 mg total) by mouth 2 (two) times daily. 09/26/20  Yes Nche, Charlene Brooke, NP  metFORMIN (GLUCOPHAGE XR) 750 MG 24 hr tablet Take 1 tablet (750 mg total) by mouth daily with breakfast. 09/26/20  Yes Nche, Charlene Brooke, NP  rosuvastatin (CRESTOR) 10 MG tablet Take 10 mg by mouth daily.   Yes [provider]    Current Outpatient Medications  Medication Sig Dispense Refill   glipiZIDE (GLUCOTROL XL) 5 MG 24 hr tablet Take 1 tablet (5 mg total) by mouth daily with breakfast. 90 tablet 3   losartan (COZAAR) 25 MG tablet Take 1 tablet (25 mg total) by mouth 2 (two) times daily. 180 tablet 3   metFORMIN (GLUCOPHAGE XR) 750 MG 24 hr tablet Take 1 tablet (750 mg total) by mouth daily with breakfast. 90 tablet 3   rosuvastatin (CRESTOR) 10 MG tablet Take 10 mg by mouth daily.     Current Facility-Administered Medications  Medication Dose Route Frequency Provider Last Rate Last Admin   0.9 %  sodium chloride infusion  500 mL Intravenous Once  Sharyn Creamer, MD        Allergies as of 04/21/2021   (No Known Allergies)    Family History  Problem Relation Age of Onset   Diabetes Mother    Hypertension Mother    Breast cancer Maternal Grandmother    Cancer Maternal Grandmother        breast and lung    Social History   Socioeconomic History   Marital status: Married    Spouse name: Not on file   Number of children: 3   Years of education: Not on file   Highest education level: Not on file  Occupational History   Occupation: math teacher     Employer: [employer]  Tobacco Use   Smoking status: Never   Smokeless tobacco: Never  Vaping Use   Vaping Use: Never used  Substance and Sexual Activity   Alcohol use: No    Alcohol/week: 0.0 standard drinks   Drug use: No   Sexual activity: Yes    Partners: Male    Birth control/protection: Other-see comments, Surgical    Comment:  husband - vasectomy  Other Topics Concern   Not on file  Social History Narrative   [name], had son Barbara [name] 11-00, Barbara [name], and Barbara [name]   Social Determinants of Health   Financial Resource Strain: Not on file  Food Insecurity: Not on file  Transportation Needs: Not on file  Physical Activity: Not on file  Stress: Not on file  Social Connections: Not on file  Intimate Partner Violence: Not on file    Physical Exam: Vital signs in last 24 hours: BP (!) 131/96    Pulse 82    Temp (!) 96.8 F (36 C) (Skin)    Resp 15    Ht 5\' 9"  (1.753 m)    Wt 232 lb (105.2 kg)    LMP 09/16/2016 (Approximate)    SpO2 100%    BMI 34.26 kg/m  GEN: NAD EYE: Sclerae anicteric ENT: MMM CV: Non-tachycardic Pulm: No increased WOB GI: Soft NEURO:  Alert & Oriented   [name], MD [facility]   04/21/2021 11:19 AM

## 2021-04-21 NOTE — Progress Notes (Signed)
Called to room to assist during endoscopic procedure.  Patient ID and intended procedure confirmed with present staff. Received instructions for my participation in the procedure from the performing physician.  

## 2021-04-23 ENCOUNTER — Encounter: Payer: Self-pay | Admitting: Internal Medicine

## 2021-04-23 ENCOUNTER — Telehealth: Payer: Self-pay

## 2021-04-23 NOTE — Telephone Encounter (Signed)
°  Follow up Call-  Call back number 04/21/2021  Post procedure Call Back phone  # [phone]  Permission to leave phone message Yes  Some recent data might be hidden     Patient questions:  Do you have a fever, pain , or abdominal swelling? No. Pain Score  0 *  Have you tolerated food without any problems? Yes.    Have you been able to return to your normal activities? Yes.    Do you have any questions about your discharge instructions: Diet   No. Medications  No. Follow up visit  No.  Do you have questions or concerns about your Care? No.  Actions: * If pain score is 4 or above: No action needed, pain <4.

## 2021-10-05 ENCOUNTER — Encounter: Payer: Commercial Managed Care - PPO | Admitting: Nurse Practitioner

## 2021-10-13 ENCOUNTER — Encounter: Payer: Self-pay | Admitting: Nurse Practitioner

## 2021-10-13 ENCOUNTER — Ambulatory Visit (INDEPENDENT_AMBULATORY_CARE_PROVIDER_SITE_OTHER): Payer: Commercial Managed Care - PPO | Admitting: Nurse Practitioner

## 2021-10-13 VITALS — BP 142/98 | HR 78 | Temp 96.1°F | Ht 69.0 in | Wt 237.2 lb

## 2021-10-13 DIAGNOSIS — E785 Hyperlipidemia, unspecified: Secondary | ICD-10-CM | POA: Diagnosis not present

## 2021-10-13 DIAGNOSIS — E1165 Type 2 diabetes mellitus with hyperglycemia: Secondary | ICD-10-CM

## 2021-10-13 DIAGNOSIS — G8929 Other chronic pain: Secondary | ICD-10-CM

## 2021-10-13 DIAGNOSIS — M25551 Pain in right hip: Secondary | ICD-10-CM | POA: Diagnosis not present

## 2021-10-13 DIAGNOSIS — E1169 Type 2 diabetes mellitus with other specified complication: Secondary | ICD-10-CM

## 2021-10-13 DIAGNOSIS — M170 Bilateral primary osteoarthritis of knee: Secondary | ICD-10-CM | POA: Diagnosis not present

## 2021-10-13 DIAGNOSIS — I10 Essential (primary) hypertension: Secondary | ICD-10-CM | POA: Diagnosis not present

## 2021-10-13 DIAGNOSIS — L02416 Cutaneous abscess of left lower limb: Secondary | ICD-10-CM

## 2021-10-13 LAB — COMPREHENSIVE METABOLIC PANEL
ALT: 22 U/L (ref 0–35)
AST: 17 U/L (ref 0–37)
Albumin: 4.7 g/dL (ref 3.5–5.2)
Alkaline Phosphatase: 87 U/L (ref 39–117)
BUN: 14 mg/dL (ref 6–23)
CO2: 29 mEq/L (ref 19–32)
Calcium: 9.6 mg/dL (ref 8.4–10.5)
Chloride: 101 mEq/L (ref 96–112)
Creatinine, Ser: 0.92 mg/dL (ref 0.40–1.20)
GFR: 73.26 mL/min (ref >60.00)
Glucose, Bld: 166 mg/dL — ABNORMAL HIGH (ref 70–99)
Potassium: 3.7 mEq/L (ref 3.5–5.1)
Sodium: 139 mEq/L (ref 135–145)
Total Bilirubin: 0.4 mg/dL (ref 0.2–1.2)
Total Protein: 8 g/dL (ref 6.0–8.3)

## 2021-10-13 LAB — LIPID PANEL
Cholesterol: 205 mg/dL — ABNORMAL HIGH (ref 0–200)
HDL: 66.3 mg/dL (ref >39.00)
LDL Cholesterol: 121 mg/dL — ABNORMAL HIGH (ref 0–99)
NonHDL: 139.08
Total CHOL/HDL Ratio: 3
Triglycerides: 91 mg/dL (ref 0.0–149.0)
VLDL: 18.2 mg/dL (ref 0.0–40.0)

## 2021-10-13 LAB — HEMOGLOBIN A1C: Hgb A1c MFr Bld: 8 % — ABNORMAL HIGH (ref 4.6–6.5)

## 2021-10-13 LAB — C-REACTIVE PROTEIN: CRP: 1.2 mg/dL (ref 0.5–20.0)

## 2021-10-13 LAB — MICROALBUMIN / CREATININE URINE RATIO
Creatinine,U: 373.5 mg/dL
Microalb Creat Ratio: 0.8 mg/g (ref 0.0–30.0)
Microalb, Ur: 3.1 mg/dL — ABNORMAL HIGH (ref 0.0–1.9)

## 2021-10-13 LAB — SEDIMENTATION RATE: Sed Rate: 26 mm/hr — ABNORMAL HIGH (ref 0–20)

## 2021-10-13 MED ORDER — MUPIROCIN 2 % EX OINT: 1.0000 | TOPICAL_OINTMENT | Freq: Two times a day (BID) | CUTANEOUS | 0 refills | Status: DC

## 2021-10-13 MED ORDER — SULFAMETHOXAZOLE-TRIMETHOPRIM 800-160 MG PO TABS: 1.0000 | ORAL_TABLET | Freq: Two times a day (BID) | ORAL | 0 refills | Status: DC

## 2021-10-13 NOTE — Progress Notes (Signed)
Established Patient Visit  Patient: Barbara [name]   DOB: 03/10/1971   51 y.o. Female  MRN: [id number] Visit Date: 10/13/2021  Subjective:    Chief Complaint  Patient presents with   Acute Visit    DM, HTN f/up Has been having knee & hip pain, family history of arthritis and would like an X-ray. Abscess on left leg    DM (diabetes mellitus) (HCC) Repeat hgbA1c, CMP, lipid panel No adverse effects with metformin and glipizide Reports numbness in bilateral 3rd toe.  Completed foot exam today. Advised to schedule annual diabetic eye exam.  Hyperlipidemia associated with type 2 diabetes mellitus ([facility]) Repeat lipid panel No adverse effects with crestor.  HYPERTENSION, BENIGN SYSTEMIC Elevated BP today. asymptomatic Admits to skipping doses in last 2days. BP Readings from Last 3 Encounters:  10/13/21 (!) 142/98  04/21/21 (!) 146/97  02/19/21 120/90   Repeat CMP Advised to take meds as prescribed, maintain DASH diet, monitor BP at home and send via mychart in 1week  Primary osteoarthritis of both knees Ongoing for several years, constant, waxing and waning, worse with prolonged inactivity (>52mns sitting or standing), joint stiffness last about 30secs. Associated with intermittent swelling. No recent injury..Marland KitchenHx of knee injury >134yrago when playing basketball and volleyball. No previous surgery.  Had arthrocentesis and cortisone injection 2017 Previous labs: normal uric acid, CCP, and CRP. ANA titer 1:80 homogenous Previous x-ray of knees indicate OA Fhx of OA (mother and brother) Pain improves with voltaren gel and cold compress.  She declined use of mobic Agreed to use voltaren gel 3x/day or naproxen 50031mID with food, continue use of cold compress as needed. Check CRP, ESR and Rh-factor Refer to sports medicine if no improvement  Reviewed medical, surgical, and social history today  Medications: Outpatient Medications Prior to Visit   Medication Sig   glipiZIDE (GLUCOTROL XL) 5 MG 24 hr tablet Take 1 tablet (5 mg total) by mouth daily with breakfast.   losartan (COZAAR) 25 MG tablet Take 1 tablet (25 mg total) by mouth 2 (two) times daily.   metFORMIN (GLUCOPHAGE XR) 750 MG 24 hr tablet Take 1 tablet (750 mg total) by mouth daily with breakfast.   rosuvastatin (CRESTOR) 10 MG tablet Take 10 mg by mouth daily.   No facility-administered medications prior to visit.   Reviewed past medical and social history.   ROS per HPI above      Objective:  BP (!) 142/98 (BP Location: Right Arm, Patient Position: Sitting, Cuff Size: Normal)   Pulse 78   Temp (!) 96.1 F (35.6 C) (Temporal)   Ht 5' 9" (1.753 m)   Wt 237 lb 3.2 oz (107.6 kg)   LMP 09/16/2016 (Approximate)   SpO2 98%   BMI 35.03 kg/m      Physical Exam Constitutional:      General: She is not in acute distress. HENT:     Right Ear: External ear normal.     Left Ear: External ear normal.     Nose: Nose normal.     Mouth/Throat:     Pharynx: No oropharyngeal exudate.  Eyes:     General: No scleral icterus. Cardiovascular:     Rate and Rhythm: Normal rate and regular rhythm.     Pulses: Normal pulses.          Dorsalis pedis pulses are 2+ on the right side and 2+ on the  left side.       Posterior tibial pulses are 2+ on the right side and 2+ on the left side.     Heart sounds: Normal heart sounds.  Pulmonary:     Effort: Pulmonary effort is normal. No respiratory distress.     Breath sounds: Normal breath sounds.  Abdominal:     General: There is no distension.     Palpations: Abdomen is soft.  Musculoskeletal:        General: Normal range of motion.     Cervical back: Normal range of motion and neck supple.     Right hip: Tenderness present. No crepitus. Normal range of motion. Normal strength.     Left hip: Normal.     Right upper leg: Normal.     Left upper leg: Normal.     Right knee: Swelling and crepitus present. No erythema or bony  tenderness. Normal range of motion. Tenderness present over the medial joint line. No patellar tendon tenderness. Normal patellar mobility. Normal pulse.     Left knee: Swelling and crepitus present. No erythema or bony tenderness. Normal range of motion. Tenderness present over the medial joint line. No patellar tendon tenderness. Normal patellar mobility. Normal pulse.     Right lower leg: Normal.     Left lower leg: Normal.     Right foot: Normal range of motion. No deformity, bunion, Charcot foot, foot drop or prominent metatarsal heads.     Left foot: Normal range of motion. No deformity, bunion, Charcot foot, foot drop or prominent metatarsal heads.  Feet:     Right foot:     Protective Sensation: 8 sites tested.  8 sites sensed.     Skin integrity: Skin integrity normal.     Toenail Condition: Right toenails are normal.     Left foot:     Protective Sensation: 8 sites tested.  8 sites sensed.     Skin integrity: Skin integrity normal.     Toenail Condition: Left toenails are normal.  Lymphadenopathy:     Cervical: No cervical adenopathy.  Skin:    General: Skin is warm and dry.     Findings: Erythema, lesion and rash present. Rash is pustular.          Comments: Pustule with purulent drainage and surrounding induration  Neurological:     Mental Status: She is alert and oriented to person, place, and time.  Psychiatric:        Behavior: Behavior normal.     Results for orders placed or performed in visit on 10/13/21  Hemoglobin A1c  Result Value Ref Range   Hgb A1c MFr Bld 8.0 (H) 4.6 - 6.5 %  Comprehensive metabolic panel  Result Value Ref Range   Sodium 139 135 - 145 mEq/L   Potassium 3.7 3.5 - 5.1 mEq/L   Chloride 101 96 - 112 mEq/L   CO2 29 19 - 32 mEq/L   Glucose, Bld 166 (H) 70 - 99 mg/dL   BUN 14 6 - 23 mg/dL   Creatinine, Ser 0.92 0.40 - 1.20 mg/dL   Total Bilirubin 0.4 0.2 - 1.2 mg/dL   Alkaline Phosphatase 87 39 - 117 U/L   AST 17 0 - 37 U/L   ALT 22 0 -  35 U/L   Total Protein 8.0 6.0 - 8.3 g/dL   Albumin 4.7 3.5 - 5.2 g/dL   GFR 73.26 >60.00 mL/min   Calcium 9.6 8.4 - 10.5 mg/dL  Lipid panel  Result Value Ref Range   Cholesterol 205 (H) 0 - 200 mg/dL   Triglycerides 91.0 0.0 - 149.0 mg/dL   HDL 66.30 >39.00 mg/dL   VLDL 18.2 0.0 - 40.0 mg/dL   LDL Cholesterol 121 (H) 0 - 99 mg/dL   Total CHOL/HDL Ratio 3    NonHDL 139.08   Microalbumin / creatinine urine ratio  Result Value Ref Range   Microalb, Ur 3.1 (H) 0.0 - 1.9 mg/dL   Creatinine,U 373.5 mg/dL   Microalb Creat Ratio 0.8 0.0 - 30.0 mg/g  Sedimentation rate  Result Value Ref Range   Sed Rate 26 (H) 0 - 20 mm/hr  C-reactive protein  Result Value Ref Range   CRP 1.2 0.5 - 20.0 mg/dL      Assessment & Plan:    Problem List Items Addressed This Visit       Cardiovascular and Mediastinum   HYPERTENSION, BENIGN SYSTEMIC - Primary    Elevated BP today. asymptomatic Admits to skipping doses in last 2days. BP Readings from Last 3 Encounters:  10/13/21 (!) 142/98  04/21/21 (!) 146/97  02/19/21 120/90   Repeat CMP Advised to take meds as prescribed, maintain DASH diet, monitor BP at home and send via mychart in 1week      Relevant Orders   Comprehensive metabolic panel (Completed)     Endocrine   DM (diabetes mellitus) ([facility])    Repeat hgbA1c, CMP, lipid panel No adverse effects with metformin and glipizide Reports numbness in bilateral 3rd toe.  Completed foot exam today. Advised to schedule annual diabetic eye exam.      Relevant Orders   Hemoglobin A1c (Completed)   Comprehensive metabolic panel (Completed)   Microalbumin / creatinine urine ratio (Completed)   Hyperlipidemia associated with type 2 diabetes mellitus ([place])    Repeat lipid panel No adverse effects with crestor.      Relevant Orders   Comprehensive metabolic panel (Completed)   Lipid panel (Completed)     Musculoskeletal and Integument   Primary osteoarthritis of both knees    Ongoing  for several years, constant, waxing and waning, worse with prolonged inactivity (>33mns sitting or standing), joint stiffness last about 30secs. Associated with intermittent swelling. No recent injury..Marland KitchenHx of knee injury >169yrago when playing basketball and volleyball. No previous surgery.  Had arthrocentesis and cortisone injection 2017 Previous labs: normal uric acid, CCP, and CRP. ANA titer 1:80 homogenous Previous x-ray of knees indicate OA Fhx of OA (mother and brother) Pain improves with voltaren gel and cold compress.  She declined use of mobic Agreed to use voltaren gel 3x/day or naproxen 50067mID with food, continue use of cold compress as needed. Check CRP, ESR and Rh-factor Refer to sports medicine if no improvement      Relevant Orders   Rheumatoid Factor   Sedimentation rate (Completed)   C-reactive protein (Completed)     Other   Chronic right hip pain   Relevant Orders   Rheumatoid Factor   Sedimentation rate (Completed)   C-reactive protein (Completed)   Other Visit Diagnoses     Abscess of left lower leg       Relevant Medications   mupirocin ointment (BACTROBAN) 2 %   sulfamethoxazole-trimethoprim (BACTRIM DS) 800-160 MG tablet     Take losartan 80m4mday, then 25mg49mD as prescribed Monitor BP at home 3x/week in AM Send BP reading via mychart in 1week. Maintain DASH diet  Start voltaren gel 3-4x/day or naproxen 500mg 39mwith food,  for joint pain. Ok to apply cold compress as needed Start knee an hip exercise If no improvement in 6-8weeks, will refer to sports medicine.  Left leg abscess: apply warm compress 2-3x/day, 8mns each time Apply bactroban ointment BID Start bactrim tab x10days with food.  Return in about 4 weeks (around 11/10/2021) for CPE (fasting).     CWilfred Lacy NP

## 2021-10-13 NOTE — Assessment & Plan Note (Signed)
Elevated BP today. asymptomatic Admits to skipping doses in last 2days. BP Readings from Last 3 Encounters:  10/13/21 (!) 142/98  04/21/21 (!) 146/97  02/19/21 120/90   Repeat CMP Advised to take meds as prescribed, maintain DASH diet, monitor BP at home and send via mychart in 1week

## 2021-10-13 NOTE — Assessment & Plan Note (Signed)
Repeat lipid panel No adverse effects with crestor.  Abnormal lipid panel: LDL not at goal. Increase crestor to '20mg'$ .  advised to maintain a heart healthy diet and daily exercise. Repeat labs in 49month (fasting)

## 2021-10-13 NOTE — Patient Instructions (Addendum)
Take losartan '50mg'$  today, then '25mg'$  BBID as prescribed Monitor BP at home 3x/week in AM Send BP reading via mychart in 1week. Maintain DASH diet  Start voltaren gel 3-4x/day or naproxen '500mg'$  BID with food, for joint pain. Ok to apply cold compress as needed Start knee an hip exercise If no improvement in 6-8weeks, will refer to sports medicine.  Left leg abscess: apply warm compress 2-3x/day, 17mns each time Apply bactroban ointment BID Start bactrim tab x10days with food.  Go to lab  Hip Exercises Ask your health care provider which exercises are safe for you. Do exercises exactly as told by your health care provider and adjust them as directed. It is normal to feel mild stretching, pulling, tightness, or discomfort as you do these exercises. Stop right away if you feel sudden pain or your pain gets worse. Do not begin these exercises until told by your health care provider. Stretching and range-of-motion exercises These exercises warm up your muscles and joints and improve the movement and flexibility of your hip. These exercises also help to relieve pain, numbness, and tingling. You may be asked to limit your range of motion if you had a hip replacement. Talk to your health care provider about these restrictions. Hamstrings, supine  Lie on your back (supine position). Loop a belt or towel over the ball of your left / right foot. The ball of your foot is on the walking surface, right under your toes. Straighten your left / right knee and slowly pull on the belt or towel to raise your leg until you feel a gentle stretch behind your knee (hamstring). Do not let your knee bend while you do this. Keep your other leg flat on the floor. Hold this position for __________ seconds. Slowly return your leg to the starting position. Repeat __________ times. Complete this exercise __________ times a day. Hip rotation  Lie on your back on a firm surface. With your left / right hand, gently  pull your left / right knee toward the shoulder that is on the same side of the body. Stop when your knee is pointing toward the ceiling. Hold your left / right ankle with your other hand. Keeping your knee steady, gently pull your left / right ankle toward your other shoulder until you feel a stretch in your buttocks. Keep your hips and shoulders firmly planted while you do this stretch. Hold this position for __________ seconds. Repeat __________ times. Complete this exercise __________ times a day. Seated stretch This exercise is sometimes called hamstrings and adductors stretch. Sit on the floor with your legs stretched wide. Keep your knees straight during this exercise. Keeping your head and back in a straight line, bend at your waist to reach for your left foot (position A). You should feel a stretch in your right inner thigh (adductors). Hold this position for __________ seconds. Then slowly return to the upright position. Keeping your head and back in a straight line, bend at your waist to reach forward (position B). You should feel a stretch behind both of your thighs and knees (hamstrings). Hold this position for __________ seconds. Then slowly return to the upright position. Keeping your head and back in a straight line, bend at your waist to reach for your right foot (position C). You should feel a stretch in your left inner thigh (adductors). Hold this position for __________ seconds. Then slowly return to the upright position. Repeat __________ times. Complete this exercise __________ times a day. Lunge This  exercise stretches the muscles of the hip (hip flexors). Place your left / right knee on the floor and bend your other knee so that is directly over your ankle. You should be half-kneeling. Keep good posture with your head over your shoulders. Tighten your buttocks to point your tailbone downward. This will prevent your back from arching too much. You should feel a gentle  stretch in the front of your left / right thigh and hip. If you do not feel a stretch, slide your other foot forward slightly and then slowly lunge forward with your chest up until your knee once again lines up over your ankle. Make sure your tailbone continues to point downward. Hold this position for __________ seconds. Slowly return to the starting position. Repeat __________ times. Complete this exercise __________ times a day. Strengthening exercises These exercises build strength and endurance in your hip. Endurance is the ability to use your muscles for a long time, even after they get tired. Bridge This exercise strengthens the muscles of your hip (hip extensors). Lie on your back on a firm surface with your knees bent and your feet flat on the floor. Tighten your buttocks muscles and lift your bottom off the floor until the trunk of your body and your hips are level with your thighs. Do not arch your back. You should feel the muscles working in your buttocks and the back of your thighs. If you do not feel these muscles, slide your feet 1-2 inches (2.5-5 cm) farther away from your buttocks. Hold this position for __________ seconds. Slowly lower your hips to the starting position. Let your muscles relax completely between repetitions. Repeat __________ times. Complete this exercise __________ times a day. Straight leg raises, side-lying This exercise strengthens the muscles that move the hip joint away from the center of the body (hip abductors). Lie on your side with your left / right leg in the top position. Lie so your head, shoulder, hip, and knee line up. You may bend your bottom knee slightly to help you balance. Roll your hips slightly forward, so your hips are stacked directly over each other and your left / right knee is facing forward. Leading with your heel, lift your top leg 4-6 inches (10-15 cm). You should feel the muscles in your top hip lifting. Do not let your foot  drift forward. Do not let your knee roll toward the ceiling. Hold this position for __________ seconds. Slowly return to the starting position. Let your muscles relax completely between repetitions. Repeat __________ times. Complete this exercise __________ times a day. Straight leg raises, side-lying This exercise strengthens the muscles that move the hip joint toward the center of the body (hip adductors). Lie on your side with your left / right leg in the bottom position. Lie so your head, shoulder, hip, and knee line up. You may place your upper foot in front to help you balance. Roll your hips slightly forward, so your hips are stacked directly over each other and your left / right knee is facing forward. Tense the muscles in your inner thigh and lift your bottom leg 4-6 inches (10-15 cm). Hold this position for __________ seconds. Slowly return to the starting position. Let your muscles relax completely between repetitions. Repeat __________ times. Complete this exercise __________ times a day. Straight leg raises, supine This exercise strengthens the muscles in the front of your thigh (quadriceps). Lie on your back (supine position) with your left / right leg extended and your other  knee bent. Tense the muscles in the front of your left / right thigh. You should see your kneecap slide up or see increased dimpling just above your knee. Keep these muscles tight as you raise your leg 4-6 inches (10-15 cm) off the floor. Do not let your knee bend. Hold this position for __________ seconds. Keep these muscles tense as you lower your leg. Relax the muscles slowly and completely between repetitions. Repeat __________ times. Complete this exercise __________ times a day. Hip abductors, standing This exercise strengthens the muscles that move the leg and hip joint away from the center of the body (hip abductors). Tie one end of a rubber exercise band or tubing to a secure surface, such as a  chair, table, or pole. Loop the other end of the band or tubing around your left / right ankle. Keeping your ankle with the band or tubing directly opposite the secured end, step away until there is tension in the tubing or band. Hold on to a chair, table, or pole as needed for balance. Lift your left / right leg out to your side. While you do this: Keep your back upright. Keep your shoulders over your hips. Keep your toes pointing forward. Make sure to use your hip muscles to slowly lift your leg. Do not tip your body or forcefully lift your leg. Hold this position for __________ seconds. Slowly return to the starting position. Repeat __________ times. Complete this exercise __________ times a day. Squats This exercise strengthens the muscles in the front of your thigh (quadriceps). Stand in a door frame so your feet and knees are in line with the frame. You may place your hands on the frame for balance. Slowly bend your knees and lower your hips like you are going to sit in a chair. Keep your lower legs in a straight-up-and-down position. Do not let your hips go lower than your knees. Do not bend your knees lower than told by your health care provider. If your hip pain increases, do not bend as low. Hold this position for ___________ seconds. Slowly push with your legs to return to standing. Do not use your hands to pull yourself to standing. Repeat __________ times. Complete this exercise __________ times a day. This information is not intended to replace advice given to you by your health care provider. Make sure you discuss any questions you have with your health care provider. Document Revised: 08/06/2020 Document Reviewed: 08/06/2020 [employer] Patient Education  [place].  Knee Exercises Ask your health care provider which exercises are safe for you. Do exercises exactly as told by your health care provider and adjust them as directed. It is normal to feel mild stretching,  pulling, tightness, or discomfort as you do these exercises. Stop right away if you feel sudden pain or your pain gets worse. Do not begin these exercises until told by your health care provider. Stretching and range-of-motion exercises These exercises warm up your muscles and joints and improve the movement and flexibility of your knee. These exercises also help to relieve pain and swelling. Knee extension, prone  Lie on your abdomen (prone position) on a bed. Place your left / right knee just beyond the edge of the surface so your knee is not on the bed. You can put a towel under your left / right thigh just above your kneecap for comfort. Relax your leg muscles and allow gravity to straighten your knee (extension). You should feel a stretch behind your left /  right knee. Hold this position for __________ seconds. Scoot up so your knee is supported between repetitions. Repeat __________ times. Complete this exercise __________ times a day. Knee flexion, active  Lie on your back with both legs straight. If this causes back discomfort, bend your left / right knee so your foot is flat on the floor. Slowly slide your left / right heel back toward your buttocks. Stop when you feel a gentle stretch in the front of your knee or thigh (flexion). Hold this position for __________ seconds. Slowly slide your left / right heel back to the starting position. Repeat __________ times. Complete this exercise __________ times a day. Quadriceps stretch, prone  Lie on your abdomen on a firm surface, such as a bed or padded floor. Bend your left / right knee and hold your ankle. If you cannot reach your ankle or pant leg, loop a belt around your foot and grab the belt instead. Gently pull your heel toward your buttocks. Your knee should not slide out to the side. You should feel a stretch in the front of your thigh and knee (quadriceps). Hold this position for __________ seconds. Repeat __________ times.  Complete this exercise __________ times a day. Hamstring, supine  Lie on your back (supine position). Loop a belt or towel over the ball of your left / right foot. The ball of your foot is on the walking surface, right under your toes. Straighten your left / right knee and slowly pull on the belt to raise your leg until you feel a gentle stretch behind your knee (hamstring). Do not let your knee bend while you do this. Keep your other leg flat on the floor. Hold this position for __________ seconds. Repeat __________ times. Complete this exercise __________ times a day. Strengthening exercises These exercises build strength and endurance in your knee. Endurance is the ability to use your muscles for a long time, even after they get tired. Quadriceps, isometric This exercise strengthens the muscles in front of your thigh (quadriceps) without moving your knee joint (isometric). Lie on your back with your left / right leg extended and your other knee bent. Put a rolled towel or small pillow under your knee if told by your health care provider. Slowly tense the muscles in the front of your left / right thigh. You should see your kneecap slide up toward your hip or see increased dimpling just above the knee. This motion will push the back of the knee toward the floor. For __________ seconds, hold the muscle as tight as you can without increasing your pain. Relax the muscles slowly and completely. Repeat __________ times. Complete this exercise __________ times a day. Straight leg raises This exercise strengthens the muscles in front of your thigh (quadriceps) and the muscles that move your hips (hip flexors). Lie on your back with your left / right leg extended and your other knee bent. Tense the muscles in the front of your left / right thigh. You should see your kneecap slide up or see increased dimpling just above the knee. Your thigh may even shake a bit. Keep these muscles tight as you raise  your leg 4-6 inches (10-15 cm) off the floor. Do not let your knee bend. Hold this position for __________ seconds. Keep these muscles tense as you lower your leg. Relax your muscles slowly and completely after each repetition. Repeat __________ times. Complete this exercise __________ times a day. Hamstring, isometric  Lie on your back on a firm  surface. Bend your left / right knee about __________ degrees. Dig your left / right heel into the surface as if you are trying to pull it toward your buttocks. Tighten the muscles in the back of your thighs (hamstring) to "dig" as hard as you can without increasing any pain. Hold this position for __________ seconds. Release the tension gradually and allow your muscles to relax completely for __________ seconds after each repetition. Repeat __________ times. Complete this exercise __________ times a day. Hamstring curls If told by your health care provider, do this exercise while wearing ankle weights. Begin with __________lb / kg weights. Then increase the weight by 1 lb (0.5 kg) increments. Do not wear ankle weights that are more than __________lb / kg. Lie on your abdomen with your legs straight. Bend your left / right knee as far as you can without feeling pain. Keep your hips flat against the floor. Hold this position for __________ seconds. Slowly lower your leg to the starting position. Repeat __________ times. Complete this exercise __________ times a day. Squats This exercise strengthens the muscles in front of your thigh and knee (quadriceps). Stand in front of a table, with your feet and knees pointing straight ahead. You may rest your hands on the table for balance but not for support. Slowly bend your knees and lower your hips like you are going to sit in a chair. Keep your weight over your heels, not over your toes. Keep your lower legs upright so they are parallel with the table legs. Do not let your hips go lower than your  knees. Do not bend lower than told by your health care provider. If your knee pain increases, do not bend as low. Hold the squat position for __________ seconds. Slowly push with your legs to return to standing. Do not use your hands to pull yourself to standing. Repeat __________ times. Complete this exercise __________ times a day. Wall slides This exercise strengthens the muscles in front of your thigh and knee (quadriceps). Lean your back against a smooth wall or door, and walk your feet out 18-24 inches (46-61 cm) from it. Place your feet hip-width apart. Slowly slide down the wall or door until your knees bend __________ degrees. Keep your knees over your heels, not over your toes. Keep your knees in line with your hips. Hold this position for __________ seconds. Repeat __________ times. Complete this exercise __________ times a day. Straight leg raises, side-lying This exercise strengthens the muscles that rotate the leg at the hip and move it away from your body (hip abductors). Lie on your side with your left / right leg in the top position. Lie so your head, shoulder, knee, and hip line up. You may bend your bottom knee to help you keep your balance. Roll your hips slightly forward so your hips are stacked directly over each other and your left / right knee is facing forward. Leading with your heel, lift your top leg 4-6 inches (10-15 cm). You should feel the muscles in your outer hip lifting. Do not let your foot drift forward. Do not let your knee roll toward the ceiling. Hold this position for __________ seconds. Slowly return your leg to the starting position. Let your muscles relax completely after each repetition. Repeat __________ times. Complete this exercise __________ times a day. Straight leg raises, prone This exercise stretches the muscles that move your hips away from the front of the pelvis (hip extensors). Lie on your abdomen on a  firm surface. You can put a pillow  under your hips if that is more comfortable. Tense the muscles in your buttocks and lift your left / right leg about 4-6 inches (10-15 cm). Keep your knee straight as you lift your leg. Hold this position for __________ seconds. Slowly lower your leg to the starting position. Let your leg relax completely after each repetition. Repeat __________ times. Complete this exercise __________ times a day. This information is not intended to replace advice given to you by your health care provider. Make sure you discuss any questions you have with your health care provider. Document Revised: 12/02/2020 Document Reviewed: 12/02/2020 Elsevier Patient Education  [place].

## 2021-10-13 NOTE — Assessment & Plan Note (Signed)
Repeat hgbA1c, CMP, lipid panel No adverse effects with metformin and glipizide Reports numbness in bilateral 3rd toe.  Completed foot exam today. Advised to schedule annual diabetic eye exam.  Improving hgbA1c: 9.3 to 8.0%, but not at goal. Increase glipizide to '10mg'$ . Maintain metformin dose. Repeat in 50month

## 2021-10-13 NOTE — Assessment & Plan Note (Signed)
Ongoing for several years, constant, waxing and waning, worse with prolonged inactivity (>82mns sitting or standing), joint stiffness last about 30secs. Associated with intermittent swelling. No recent injury..Marland KitchenHx of knee injury >89yrago when playing basketball and volleyball. No previous surgery.  Had arthrocentesis and cortisone injection 2017 Previous labs: normal uric acid, CCP, and CRP. ANA titer 1:80 homogenous Previous x-ray of knees indicate OA Fhx of OA (mother and brother) Pain improves with voltaren gel and cold compress.  She declined use of mobic Agreed to use voltaren gel 3x/day or naproxen 50027mID with food, continue use of cold compress as needed. Check CRP, ESR and Rh-factor: normal Refer to sports medicine if no improvement

## 2021-10-14 LAB — RHEUMATOID FACTOR: Rheumatoid fact SerPl-aCnc: <14 IU/mL (ref <14)

## 2021-10-14 MED ORDER — GLIPIZIDE ER 10 MG PO TB24: 10.0000 mg | ORAL_TABLET | Freq: Every day | ORAL | 3 refills | Status: DC

## 2021-10-14 MED ORDER — ROSUVASTATIN CALCIUM 20 MG PO TABS: 20.0000 mg | ORAL_TABLET | Freq: Every day | ORAL | 3 refills | Status: DC

## 2021-10-14 NOTE — Addendum Note (Signed)
Addended by: Wilfred Lacy L on: 10/14/2021 10:12 AM   Modules accepted: Orders

## 2021-10-27 NOTE — Telephone Encounter (Signed)
error 

## 2021-11-13 ENCOUNTER — Ambulatory Visit: Payer: Commercial Managed Care - PPO | Admitting: Nurse Practitioner

## 2021-11-16 ENCOUNTER — Encounter: Payer: Self-pay | Admitting: Nurse Practitioner

## 2021-11-16 ENCOUNTER — Ambulatory Visit (INDEPENDENT_AMBULATORY_CARE_PROVIDER_SITE_OTHER): Payer: Commercial Managed Care - PPO | Admitting: Nurse Practitioner

## 2021-11-16 VITALS — BP 150/100 | HR 78 | Temp 95.6°F | Ht 69.0 in | Wt 237.6 lb

## 2021-11-16 DIAGNOSIS — Z0001 Encounter for general adult medical examination with abnormal findings: Secondary | ICD-10-CM

## 2021-11-16 DIAGNOSIS — L02416 Cutaneous abscess of left lower limb: Secondary | ICD-10-CM | POA: Diagnosis not present

## 2021-11-16 DIAGNOSIS — I10 Essential (primary) hypertension: Secondary | ICD-10-CM | POA: Diagnosis not present

## 2021-11-16 DIAGNOSIS — E1165 Type 2 diabetes mellitus with hyperglycemia: Secondary | ICD-10-CM

## 2021-11-16 MED ORDER — SULFAMETHOXAZOLE-TRIMETHOPRIM 800-160 MG PO TABS: 1.0000 | ORAL_TABLET | Freq: Two times a day (BID) | ORAL | 0 refills | Status: DC

## 2021-11-16 MED ORDER — BLOOD GLUCOSE METER KIT: PACK | 0 refills | Status: AC

## 2021-11-16 MED ORDER — LOSARTAN POTASSIUM 100 MG PO TABS: 100.0000 mg | ORAL_TABLET | Freq: Every day | ORAL | 3 refills | Status: DC

## 2021-11-16 NOTE — Assessment & Plan Note (Signed)
Hx of MRSA colonization. Scant purulent drainage. Minimal improvement with Bactroban ointment.  stop bactroban and start bactrim tabs Advised about importance of DM control to improvement healing

## 2021-11-16 NOTE — Progress Notes (Signed)
Complete physical exam  Patient: Barbara [name]   DOB: [birth year]   51 y.o. Female  MRN: [id number] Visit Date: 11/16/2021  Subjective:    Chief Complaint  Patient presents with   Annual Exam    CPE Pt fasting  Would like to check MRSA spot on back of  left leg Requesting records for eye exam    Barbara [name] is a 51 y.o. female who presents today for a complete physical exam. She reports consuming a general diet.  none  She generally feels fairly well. She reports sleeping fairly well. She does have additional problems to discuss today.  Vision:Yes Dental:Yes STD Screen:No  Most recent fall risk assessment:    11/16/2021    9:03 AM  [place] in the past year? 0  Number falls in past yr: 0  Injury with Fall? 0   Most recent depression screenings:    11/16/2021    9:53 AM 10/13/2021    9:20 AM  PHQ 2/9 Scores  PHQ - 2 Score 0 2  PHQ- 9 Score 4 9   HPI  HYPERTENSION, BENIGN SYSTEMIC BP not at goal with losartan 666m daily BP Readings from Last 3 Encounters:  11/16/21 (!) 150/100  10/13/21 (!) 142/98  04/21/21 (!) 146/97   Advised about need for DASH diet and regular exercise Increase losartan to 1051mdaily She is send BP readings in 2weeks  DM [place]diabetes mellitus) ([place] glucom[place] to check glucose 2-3x/week (fasting) Advised about importance of lifestyle modification: diet and exercise. Normal DM foot exam today uptodate with DM eye exam: report requested F/up in 66m45monthAbscess of left lower leg Hx of MRSA colonization. Scant purulent drainage. Minimal improvement with Bactroban ointment.  stop bactroban and start bactrim tabs Advised about importance of DM control to improvement healing   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 07/ 2017    History of pregnancy induced hypertension    Hyperlipidemia    Hypertension    during pregnancy-no medications   Iron deficiency anemia    Menorrhagia    Nephrolithiasis    bilateral tiny nonobstructive calculi per CT 06-21-2016   Uterine fibroid    Wears glasses    Past Surgical History:  Procedure Laterality Date   BREAST EXCISIONAL BIOPSY Left 1982   benign   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   CESAREAN SECTION  02-04-1999    vbac 1-06 and 05-19-2005   ERCP W/ SPHINCTEROTOMY AND BALLOON DILATION  10/11/2005   LAPAROSCOPIC CHOLECYSTECTOMY  10/04/2005   w/ ERCP   LAPAROSCOPIC VAGINAL HYSTERECTOMY WITH SALPINGO OOPHORECTOMY Left 09/28/2016   Procedure: LAPAROSCOPIC ASSISTED VAGINAL HYSTERECTOMY WITH SALPINGO OOPHORECTOMY, right salpingectomy;  Surgeon: GreDian QueenD;  [facility];  Laterality: Left;  NEED BED FOR OVERNIGHT STAY   Social History   Socioeconomic History   Marital status: Married    Spouse name: Not on file   Number of children: 3   Years of education: Not on file   Highest education level: Not on file  Occupational History   Occupation: math teacher    [employer]er: [employer]   Smoking status: Never   Smokeless tobacco: Never  Vaping Use  Vaping Use: Never used  Substance and Sexual Activity   Alcohol use: No    Alcohol/week: 0.0 standard drinks of alcohol   Drug use: No   Sexual activity: Yes    Partners: Male    Birth control/protection: Other-see comments, Surgical    Comment:  husband - vasectomy  Other Topics Concern   Not on file  Social History Narrative   Married to Tarkio, had son [name] 11-00, [name], and skylar   Social Determinants of Health   Financial Resource Strain: Not on file  Food Insecurity: Not on file  Transportation Needs: Not on file  Physical Activity: Not on file  Stress: Not on file  Social Connections: Not on file   Intimate Partner Violence: Not on file   Family Status  Relation Name Status   Mother  Alive   Father  Alive   Sister  Alive   Brother  Alive   MGM  Deceased   MGF  Deceased   PGM  Deceased   PGF  Deceased   Family History  Problem Relation Age of Onset   Diabetes Mother    Hypertension Mother    Breast cancer Maternal Grandmother    Cancer Maternal Grandmother        breast and lung   No Known Allergies  Patient Care Team: Naiyana Barbian, Charlene Brooke, NP as PCP - General (Internal Medicine)   Medications: Outpatient Medications Prior to Visit  Medication Sig   glipiZIDE (GLUCOTROL XL) 10 MG 24 hr tablet Take 1 tablet (10 mg total) by mouth daily with breakfast.   metFORMIN (GLUCOPHAGE XR) 750 MG 24 hr tablet Take 1 tablet (750 mg total) by mouth daily with breakfast.   mupirocin ointment (BACTROBAN) 2 % Place 1 Application into the nose 2 (two) times daily. Apply with dressing change   rosuvastatin (CRESTOR) 20 MG tablet Take 1 tablet (20 mg total) by mouth daily.   [DISCONTINUED] losartan (COZAAR) 25 MG tablet Take 1 tablet (25 mg total) by mouth 2 (two) times daily.   [DISCONTINUED] sulfamethoxazole-trimethoprim (BACTRIM DS) 800-160 MG tablet Take 1 tablet by mouth 2 (two) times daily.   No facility-administered medications prior to visit.    Review of Systems  Constitutional:  Negative for fever.  HENT:  Negative for congestion and sore throat.   Eyes:        Negative for visual changes  Respiratory:  Negative for cough and shortness of breath.   Cardiovascular:  Negative for chest pain, palpitations and leg swelling.  Gastrointestinal:  Negative for blood in stool, constipation and diarrhea.  Genitourinary:  Negative for dysuria, frequency and urgency.  Musculoskeletal:  Negative for myalgias.  Skin:  Positive for wound. Negative for rash.  Neurological:  Negative for dizziness and headaches.  Hematological:  Does not bruise/bleed easily.  Psychiatric/Behavioral:   Negative for sleep disturbance and suicidal ideas. The patient is not nervous/anxious.     Last metabolic panel Lab Results  Component Value Date   GLUCOSE 166 (H) 10/13/2021   NA 139 10/13/2021   K 3.7 10/13/2021   CL 101 10/13/2021   CO2 29 10/13/2021   BUN 14 10/13/2021   CREATININE 0.92 10/13/2021   CALCIUM 9.6 10/13/2021   PROT 8.0 10/13/2021   ALBUMIN 4.7 10/13/2021   BILITOT 0.4 10/13/2021   ALKPHOS 87 10/13/2021   AST 17 10/13/2021   ALT 22 10/13/2021   Last lipids Lab Results  Component Value Date   CHOL 205 (H) 10/13/2021  HDL 66.30 10/13/2021   LDLCALC 121 (H) 10/13/2021   LDLDIRECT 145.1 10/26/2012   TRIG 91.0 10/13/2021   CHOLHDL 3 10/13/2021   Last hemoglobin A1c Lab Results  Component Value Date   HGBA1C 8.0 (H) 10/13/2021        Objective:  BP (!) 150/100 (BP Location: Left Arm, Patient Position: Supine)   Pulse 78   Temp (!) 95.6 F (35.3 C) (Temporal)   Ht 5' 9"  (1.753 m)   Wt 237 lb 9.6 oz (107.8 kg)   LMP 09/16/2016 (Approximate)   SpO2 96%   BMI 35.09 kg/m     BP Readings from Last 3 Encounters:  11/16/21 (!) 150/100  10/13/21 (!) 142/98  04/21/21 (!) 146/97   Wt Readings from Last 3 Encounters:  11/16/21 237 lb 9.6 oz (107.8 kg)  10/13/21 237 lb 3.2 oz (107.6 kg)  04/21/21 232 lb (105.2 kg)   Physical Exam Vitals and nursing note reviewed.  Constitutional:      General: She is not in acute distress.    Appearance: She is obese.  HENT:     Right Ear: Tympanic membrane, ear canal and external ear normal.     Left Ear: Tympanic membrane, ear canal and external ear normal.     Nose: Nose normal.     Mouth/Throat:     Pharynx: No oropharyngeal exudate.  Eyes:     General: No scleral icterus.    Extraocular Movements: Extraocular movements intact.     Conjunctiva/sclera: Conjunctivae normal.  Cardiovascular:     Rate and Rhythm: Normal rate and regular rhythm.     Pulses: Normal pulses.     Heart sounds: Normal heart  sounds.  Pulmonary:     Effort: Pulmonary effort is normal. No respiratory distress.     Breath sounds: Normal breath sounds.  Chest:  Breasts:    Breasts are symmetrical.     Right: Normal.     Left: Normal.  Abdominal:     General: Bowel sounds are normal. There is no distension.     Palpations: Abdomen is soft.  Musculoskeletal:        General: Normal range of motion.     Cervical back: Normal range of motion and neck supple.     Right lower leg: No edema.     Left lower leg: No edema.  Lymphadenopathy:     Cervical: No cervical adenopathy.     Upper Body:     Right upper body: No supraclavicular, axillary or pectoral adenopathy.     Left upper body: No supraclavicular, axillary or pectoral adenopathy.  Skin:    General: Skin is warm and dry.  Neurological:     Mental Status: She is alert and oriented to person, place, and time.  Psychiatric:        Behavior: Behavior normal.     No results found for any visits on 11/16/21.    Assessment & Plan:    Routine Health Maintenance and Physical Exam  Immunization History  Administered Date(s) Administered   Influenza,inj,Quad PF,6+ Mos 01/08/2013   PFIZER(Purple Top)SARS-COV-2 Vaccination 08/10/2019, 08/31/2019   Td 05/12/2005   Tdap 04/14/2017   Health Maintenance  Topic Date Due   OPHTHALMOLOGY EXAM  04/02/2020   INFLUENZA VACCINE  11/03/2021   COVID-19 Vaccine (3 - Pfizer series) 12/02/2021 (Originally 10/26/2019)   HEMOGLOBIN A1C  04/15/2022   PAP SMEAR-Modifier  09/26/2022   FOOT EXAM  11/17/2022   TETANUS/TDAP  04/15/2027   COLONOSCOPY (Pts  45-86yr Insurance coverage will need to be confirmed)  04/22/2031   Hepatitis C Screening  Completed   HIV Screening  Completed   HPV VACCINES  Aged Out   Discussed health benefits of physical activity, and encouraged her to engage in regular exercise appropriate for her age and condition. Schedule appt for mammogram (02/2022)  Problem List Items Addressed This Visit        Cardiovascular and Mediastinum   HYPERTENSION, BENIGN SYSTEMIC    BP not at goal with losartan 537mdaily BP Readings from Last 3 Encounters:  11/16/21 (!) 150/100  10/13/21 (!) 142/98  04/21/21 (!) 146/97   Advised about need for DASH diet and regular exercise Increase losartan to 10053maily She is send BP readings in 2weeks      Relevant Medications   losartan (COZAAR) 100 MG tablet     Endocrine   DM [place]diabetes mellitus) [place]  Provided glucometer to check glucose 2-3x/week (fasting) Advised about importance of lifestyle modification: diet and exercise. Normal DM foot exam today uptodate with DM eye exam: report requested F/up in 29mo30month   Relevant Medications   losartan (COZAAR) 100 MG tablet   blood glucose meter kit and supplies     Other   Abscess of left lower leg    Hx of MRSA colonization. Scant purulent drainage. Minimal improvement with Bactroban ointment.  stop bactroban and start bactrim tabs Advised about importance of DM control to improvement healing      Relevant Medications   sulfamethoxazole-trimethoprim (BACTRIM DS) 800-160 MG tablet   Other Visit Diagnoses     Encounter for preventative adult health care exam with abnormal findings    -  Primary      Return in about 3 months (around 02/16/2022) for DM, HTN, hyperlipidemia (fasting).     CharWilfred Lacy

## 2021-11-16 NOTE — Patient Instructions (Addendum)
increase losartan dose to '100mg'$  daily Use a large BP cuff (large adult 34-42cm) Check glucose 2-3x/day in AM (before breakfast) Send these readings via mychart in 2weeks. Start daily exercise (walking 20-81mns) a day Maintain a low carb/low fat/low sugar diet. Schedule appt for mammogram. Have eye exam report faxed to me

## 2021-11-16 NOTE — Assessment & Plan Note (Addendum)
Provided glucometer to check glucose 2-3x/week (fasting) Advised about importance of lifestyle modification: diet and exercise. Normal DM foot exam today uptodate with DM eye exam: report requested F/up in 52month

## 2021-11-16 NOTE — Assessment & Plan Note (Signed)
BP not at goal with losartan '50mg'$  daily BP Readings from Last 3 Encounters:  11/16/21 (!) 150/100  10/13/21 (!) 142/98  04/21/21 (!) 146/97   Advised about need for DASH diet and regular exercise Increase losartan to '100mg'$  daily She is send BP readings in 2weeks

## 2021-11-21 ENCOUNTER — Other Ambulatory Visit: Payer: Self-pay | Admitting: Nurse Practitioner

## 2021-11-21 DIAGNOSIS — E1165 Type 2 diabetes mellitus with hyperglycemia: Secondary | ICD-10-CM

## 2021-11-23 NOTE — Telephone Encounter (Signed)
Chart supports Rx Last OV: 11/2021 Next OV: 02/2022  

## 2021-12-22 ENCOUNTER — Emergency Department (HOSPITAL_BASED_OUTPATIENT_CLINIC_OR_DEPARTMENT_OTHER): Payer: Commercial Managed Care - PPO | Admitting: Radiology

## 2021-12-22 ENCOUNTER — Encounter (HOSPITAL_BASED_OUTPATIENT_CLINIC_OR_DEPARTMENT_OTHER): Payer: Self-pay

## 2021-12-22 ENCOUNTER — Emergency Department (HOSPITAL_BASED_OUTPATIENT_CLINIC_OR_DEPARTMENT_OTHER)
Admission: EM | Admit: 2021-12-22 | Discharge: 2021-12-22 | Disposition: A | Discharge status: Home / Self Care | Payer: Commercial Managed Care - PPO | Attending: Emergency Medicine | Admitting: Emergency Medicine

## 2021-12-22 ENCOUNTER — Ambulatory Visit
Admission: EM | Admit: 2021-12-22 | Discharge: 2021-12-22 | Disposition: A | Discharge status: Other Acute Inpatient Hospital | Payer: Commercial Managed Care - PPO | Attending: Internal Medicine | Admitting: Internal Medicine

## 2021-12-22 ENCOUNTER — Other Ambulatory Visit: Payer: Self-pay

## 2021-12-22 DIAGNOSIS — R0789 Other chest pain: Secondary | ICD-10-CM | POA: Insufficient documentation

## 2021-12-22 DIAGNOSIS — Z7984 Long term (current) use of oral hypoglycemic drugs: Secondary | ICD-10-CM | POA: Diagnosis not present

## 2021-12-22 DIAGNOSIS — R42 Dizziness and giddiness: Secondary | ICD-10-CM | POA: Diagnosis not present

## 2021-12-22 DIAGNOSIS — R202 Paresthesia of skin: Secondary | ICD-10-CM | POA: Insufficient documentation

## 2021-12-22 DIAGNOSIS — R519 Headache, unspecified: Secondary | ICD-10-CM | POA: Diagnosis not present

## 2021-12-22 DIAGNOSIS — I10 Essential (primary) hypertension: Secondary | ICD-10-CM | POA: Insufficient documentation

## 2021-12-22 DIAGNOSIS — R079 Chest pain, unspecified: Secondary | ICD-10-CM

## 2021-12-22 DIAGNOSIS — E119 Type 2 diabetes mellitus without complications: Secondary | ICD-10-CM | POA: Insufficient documentation

## 2021-12-22 DIAGNOSIS — Z79899 Other long term (current) drug therapy: Secondary | ICD-10-CM | POA: Insufficient documentation

## 2021-12-22 LAB — CBC
HCT: 36.6 % (ref 36.0–46.0)
Hemoglobin: 12.1 g/dL (ref 12.0–15.0)
MCH: 29.5 pg (ref 26.0–34.0)
MCHC: 33.1 g/dL (ref 30.0–36.0)
MCV: 89.3 fL (ref 80.0–100.0)
Platelets: 334 10*3/uL (ref 150–400)
RBC: 4.1 MIL/uL (ref 3.87–5.11)
RDW: 13.7 % (ref 11.5–15.5)
WBC: 6.7 10*3/uL (ref 4.0–10.5)
nRBC: 0 % (ref 0.0–0.2)

## 2021-12-22 LAB — BASIC METABOLIC PANEL
Anion gap: 11 (ref 5–15)
BUN: 13 mg/dL (ref 6–20)
CO2: 26 mmol/L (ref 22–32)
Calcium: 9.5 mg/dL (ref 8.9–10.3)
Chloride: 104 mmol/L (ref 98–111)
Creatinine, Ser: 0.89 mg/dL (ref 0.44–1.00)
GFR, Estimated: >60 mL/min (ref >60)
Glucose, Bld: 72 mg/dL (ref 70–99)
Potassium: 3.6 mmol/L (ref 3.5–5.1)
Sodium: 141 mmol/L (ref 135–145)

## 2021-12-22 LAB — CBG MONITORING, ED
Glucose-Capillary: 58 mg/dL — ABNORMAL LOW (ref 70–99)
Glucose-Capillary: 172 mg/dL — ABNORMAL HIGH (ref 70–99)

## 2021-12-22 LAB — D-DIMER, QUANTITATIVE: D-Dimer, Quant: 0.42 ug/mL-FEU (ref 0.00–0.50)

## 2021-12-22 LAB — TROPONIN I (HIGH SENSITIVITY)
Troponin I (High Sensitivity): <2 ng/L (ref <18)
Troponin I (High Sensitivity): <2 ng/L (ref <18)

## 2021-12-22 MED ORDER — DEXTROSE 50 % IV SOLN
1.0000 | Freq: Once | INTRAVENOUS | Status: AC
  Administered 2021-12-22: 50 mL via INTRAVENOUS
  Filled 2021-12-22: qty 50

## 2021-12-22 NOTE — ED Triage Notes (Signed)
Pt c/o chest pain, tingling in her left arm, headache. No complaints of Sob but stated that deep breaths are hard to take and pain intensifies with breathing.

## 2021-12-22 NOTE — Discharge Instructions (Signed)
Go to the emergency department as soon as you leave urgent care for further evaluation and management. 

## 2021-12-22 NOTE — ED Provider Notes (Signed)
[facility]    CSN: [id number] Arrival date & time: 12/22/21  1305      History   Chief Complaint Chief Complaint  Patient presents with   Chest Pain    HPI Barbara [name] is a 51 y.o. female.   Patient presents with chest pain that started this morning.  Patient reports chest pain is located in the center of the chest and is intermittent.  Patient reports chest pain is random and does not worsen with exertion.  Describes it as a sharp pain that is rated 6/10 on pain scale.  Patient states that when she takes a deep breath that the chest pain worsens.  Denies shortness of breath.  Pain does not radiate.  She also has a headache in the frontal portion of the head that is rated 5/10 on pain scale.  Patient reports that she has had chest pain in the past a few years prior but never got evaluated as it resolved after taking aspirin.  Patient states that she has taken 2 aspirin today with no improvement in chest pain.  Patient does have hypertension and has been taking her medications as prescribed.   Chest Pain   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 07/ 2017   History of pregnancy induced hypertension    Hyperlipidemia    Hypertension    during pregnancy-no medications   Iron deficiency anemia    Menorrhagia    Nephrolithiasis    bilateral tiny nonobstructive calculi per CT 06-21-2016   Uterine fibroid    Wears glasses     Patient Active Problem List   Diagnosis Date Noted   Abscess of left lower leg 11/16/2021   Primary osteoarthritis of both knees 10/13/2021   DM (diabetes mellitus) ([place]) 04/06/2018   S/P laparoscopic assisted vaginal hysterectomy (LAVH) 09/28/2016   Insomnia 02/18/2014   History of gestational diabetes 10/26/2012   Chronic right hip pain 06/03/2010    Hyperlipidemia associated with type 2 diabetes mellitus ([place]) 10/23/2009   HYPERTENSION, BENIGN SYSTEMIC 06/02/2006    Past Surgical History:  Procedure Laterality Date   BREAST EXCISIONAL BIOPSY Left 1982   benign   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   CESAREAN SECTION  02-04-1999    vbac 1-06 and 05-19-2005   ERCP W/ SPHINCTEROTOMY AND BALLOON DILATION  10/11/2005   LAPAROSCOPIC CHOLECYSTECTOMY  10/04/2005   w/ ERCP   LAPAROSCOPIC VAGINAL HYSTERECTOMY WITH SALPINGO OOPHORECTOMY Left 09/28/2016   Procedure: LAPAROSCOPIC ASSISTED VAGINAL HYSTERECTOMY WITH SALPINGO OOPHORECTOMY, right salpingectomy;  Surgeon: Dian Queen, MD;  Location: [place] [facility];  Service: Gynecology;  Laterality: Left;  NEED BED FOR OVERNIGHT STAY    OB History     Gravida  4   Para      Term      Preterm      AB  1   Living  3      SAB  1   IAB      Ectopic      Multiple      Live Births               Home Medications    Prior to Admission medications   Medication Sig Start Date End Date Taking? Authorizing Provider  blood  glucose meter kit and supplies Dispense based on patient and insurance preference. Check glucose Daily in AM. ICD10: E11.65 11/16/21   Nche, Charlene Brooke, NP  glipiZIDE (GLUCOTROL XL) 10 MG 24 hr tablet Take 1 tablet (10 mg total) by mouth daily with breakfast. 10/14/21   Nche, Charlene Brooke, NP  glipiZIDE (GLUCOTROL XL) 5 MG 24 hr tablet TAKE 1 TAB WITH BREAKFAST 11/23/21   Nche, Charlene Brooke, NP  losartan (COZAAR) 100 MG tablet Take 1 tablet (100 mg total) by mouth daily. 11/16/21   Nche, Charlene Brooke, NP  metFORMIN (GLUCOPHAGE-XR) 750 MG 24 hr tablet TAKE 1 TAB WITH BREAKFAST 11/23/21   Nche, Charlene Brooke, NP  mupirocin ointment (BACTROBAN) 2 % Place 1 Application into the nose 2 (two) times daily. Apply with dressing change 10/13/21   Nche, Charlene Brooke, NP  rosuvastatin (CRESTOR) 10 MG tablet TAKE 1 TABLET EVERY DAY 11/23/21   Nche,  Charlene Brooke, NP  rosuvastatin (CRESTOR) 20 MG tablet Take 1 tablet (20 mg total) by mouth daily. 10/14/21   Nche, Charlene Brooke, NP  sulfamethoxazole-trimethoprim (BACTRIM DS) 800-160 MG tablet Take 1 tablet by mouth 2 (two) times daily. 11/16/21   Nche, Charlene Brooke, NP    Family History Family History  Problem Relation Age of Onset   Diabetes Mother    Hypertension Mother    Breast cancer Maternal Grandmother    Cancer Maternal Grandmother        breast and lung    Social History Social History   Tobacco Use   Smoking status: Never   Smokeless tobacco: Never  Vaping Use   Vaping Use: Never used  Substance Use Topics   Alcohol use: No    Alcohol/week: 0.0 standard drinks of alcohol   Drug use: No     Allergies   Patient has no known allergies.   Review of Systems Review of Systems Per HPI  Physical Exam Triage Vital Signs ED Triage Vitals  Enc Vitals Group     BP 12/22/21 1324 (!) 139/103     Pulse Rate 12/22/21 1324 84     Resp --      Temp 12/22/21 1324 97.8 F (36.6 C)     Temp Source 12/22/21 1324 Oral     SpO2 12/22/21 1324 96 %     Weight --      Height --      Head Circumference --      Peak Flow --      Pain Score 12/22/21 1322 6     Pain Loc --      Pain Edu? --      Excl. in [place]? --    No data found.  Updated Vital Signs BP (!) 139/103 (BP Location: Left Arm)   Pulse 84   Temp 97.8 F (36.6 C) (Oral)   LMP 09/16/2016 (Approximate)   SpO2 96%   Visual Acuity Right Eye Distance:   Left Eye Distance:   Bilateral Distance:    Right Eye Near:   Left Eye Near:    Bilateral Near:     Physical Exam Constitutional:      General: She is not in acute distress.    Appearance: Normal appearance. She is not toxic-appearing or diaphoretic.  HENT:     Head: Normocephalic and atraumatic.  Eyes:     Extraocular Movements: Extraocular movements intact.     Conjunctiva/sclera: Conjunctivae normal.     Pupils: Pupils are equal, round, and  reactive to light.  Cardiovascular:     Rate and Rhythm: Normal rate and regular rhythm.     Pulses: Normal pulses.     Heart sounds: Normal heart sounds.  Pulmonary:     Effort: Pulmonary effort is normal. No respiratory distress.     Breath sounds: Normal breath sounds.  Chest:     Chest wall: No tenderness.  Neurological:     General: No focal deficit present.     Mental Status: She is alert and oriented to person, place, and time. Mental status is at baseline.     Cranial Nerves: Cranial nerves 2-12 are intact.     Sensory: Sensation is intact.     Motor: Motor function is intact.     Coordination: Coordination is intact.     Gait: Gait is intact.  Psychiatric:        Mood and Affect: Mood normal.        Behavior: Behavior normal.        Thought Content: Thought content normal.        Judgment: Judgment normal.      UC Treatments / Results  Labs (all labs ordered are listed, but only abnormal results are displayed) Labs Reviewed - No data to display  EKG   Radiology No results found.  Procedures Procedures (including critical care time)  Medications Ordered in UC Medications - No data to display  Initial Impression / Assessment and Plan / UC Course  I have reviewed the triage vital signs and the nursing notes.  Pertinent labs & imaging results that were available during my care of the patient were reviewed by me and considered in my medical decision making (see chart for details).     EKG showing sinus rhythm which is fairly unremarkable.  Patient's vital signs are stable and does not appear to be any acute distress.  Although, I do think patient needs more extensive evaluation to rule out cardiac etiology.  Urgent care has limited resources so the patient was advised to go to the emergency department for further evaluation and management.  Patient was agreeable with plan.  Patient wished to leave via self transport. Final Clinical Impressions(s) / UC Diagnoses    Final diagnoses:  Other chest pain  Acute nonintractable headache, unspecified headache type     Discharge Instructions      Go to the emergency department as soon as you leave urgent care for further evaluation and management.    ED Prescriptions   None    PDMP not reviewed this encounter.   Teodora Medici, [place] 12/22/21 1413

## 2021-12-22 NOTE — ED Notes (Signed)
Patient is being discharged from the Urgent Care and sent to the Emergency Department via POV . Per [place], NP, patient is in need of higher level of care due to symptoms. Patient is aware and verbalizes understanding of plan of care.  Vitals:   12/22/21 1324  BP: (!) 139/103  Pulse: 84  Temp: 97.8 F (36.6 C)  SpO2: 96%

## 2021-12-22 NOTE — ED Triage Notes (Signed)
Pt presents POV from UC on [place]  Pt reports non radiating mid-sternum chest pain, tight, dull pain. Started at 0930 this am. Pt denies SOB, N/V

## 2021-12-22 NOTE — ED Provider Notes (Signed)
[name] EMERGENCY DEPT Provider Note   CSN: [id number] Arrival date & time: 12/22/21  1408     History {Add pertinent medical, surgical, social history, OB history to HPI:1} Chief Complaint  Patient presents with   Chest Pain    Barbara [name] is a 51 y.o. female.  HPI      51 year old female with a history of hyperlipidemia, hypertension, diabetes, who presents with concern for chest pain.  The chest pain started this morning.  Is located in the center of the chest and comes and goes.  It is dull, worse with deep breaths.  Denies any radiation of pain, although did have some tingling of left arm. Denies exertional pain.  Had headache but believe that was due to hypoglycemia and improved after glucose administration. Denies other weakness, difficulty talking or walking, visual changes or facial droop.  No nausea, vomiting, dyspnea. Did have lightheadedness while pumping gas on way here. Seen at urgent care prior to presentation.  No immediate family hx of CAD but does have hx in great grandmother.  No smoking/etoh/drugs. No hx of DVT/PE, no recent surgeries, immobilization, long trips.    Home Medications Prior to Admission medications   Medication Sig Start Date End Date Taking? Authorizing Provider  blood glucose meter kit and supplies Dispense based on patient and insurance preference. Check glucose Daily in AM. ICD10: E11.65 11/16/21   Nche, Charlene Brooke, NP  glipiZIDE (GLUCOTROL XL) 10 MG 24 hr tablet Take 1 tablet (10 mg total) by mouth daily with breakfast. 10/14/21   Nche, Charlene Brooke, NP  glipiZIDE (GLUCOTROL XL) 5 MG 24 hr tablet TAKE 1 TAB WITH BREAKFAST 11/23/21   Nche, Charlene Brooke, NP  losartan (COZAAR) 100 MG tablet Take 1 tablet (100 mg total) by mouth daily. 11/16/21   Nche, Charlene Brooke, NP  metFORMIN (GLUCOPHAGE-XR) 750 MG 24 hr tablet TAKE 1 TAB WITH BREAKFAST 11/23/21   Nche, Charlene Brooke, NP  mupirocin ointment (BACTROBAN) 2 % Place 1  Application into the nose 2 (two) times daily. Apply with dressing change 10/13/21   Nche, Charlene Brooke, NP  rosuvastatin (CRESTOR) 10 MG tablet TAKE 1 TABLET EVERY DAY 11/23/21   Nche, Charlene Brooke, NP  rosuvastatin (CRESTOR) 20 MG tablet Take 1 tablet (20 mg total) by mouth daily. 10/14/21   Nche, Charlene Brooke, NP  sulfamethoxazole-trimethoprim (BACTRIM DS) 800-160 MG tablet Take 1 tablet by mouth 2 (two) times daily. 11/16/21   Nche, Charlene Brooke, NP      Allergies    Patient has no known allergies.    Review of Systems   Review of Systems  Physical Exam Updated Vital Signs BP (!) 148/87 (BP Location: Right Arm)   Pulse 70   Temp 98.3 F (36.8 C)   Resp 16   Ht $R'5\' 9"'OY$  (1.753 m)   Wt 107.8 kg   LMP 09/16/2016 (Approximate)   SpO2 97%   BMI 35.10 kg/m  Physical Exam Vitals and nursing note reviewed.  Constitutional:      General: She is not in acute distress.    Appearance: She is well-developed. She is not diaphoretic.  HENT:     Head: Normocephalic and atraumatic.  Eyes:     Conjunctiva/sclera: Conjunctivae normal.  Cardiovascular:     Rate and Rhythm: Normal rate and regular rhythm.     Heart sounds: Normal heart sounds. No murmur heard.    No friction rub. No gallop.  Pulmonary:     Effort: Pulmonary effort is  normal. No respiratory distress.     Breath sounds: Normal breath sounds. No wheezing or rales.  Abdominal:     General: There is no distension.     Palpations: Abdomen is soft.     Tenderness: There is no abdominal tenderness. There is no guarding.  Musculoskeletal:        General: No tenderness.     Cervical back: Normal range of motion.  Skin:    General: Skin is warm and dry.     Findings: No erythema or rash.  Neurological:     Mental Status: She is alert and oriented to person, place, and time.     ED Results / Procedures / Treatments   Labs (all labs ordered are listed, but only abnormal results are displayed) Labs Reviewed  CBG MONITORING,  ED - Abnormal; Notable for the following components:      Result Value   Glucose-Capillary 58 (*)    All other components within normal limits  CBG MONITORING, ED - Abnormal; Notable for the following components:   Glucose-Capillary 172 (*)    All other components within normal limits  BASIC METABOLIC PANEL  CBC  PREGNANCY, URINE  TROPONIN I (HIGH SENSITIVITY)    EKG EKG Interpretation  Date/Time:  Tuesday December 22 2021 14:14:28 EDT Ventricular Rate:  67 PR Interval:  180 QRS Duration: 94 QT Interval:  376 QTC Calculation: 397 R Axis:   89 Text Interpretation: Normal sinus rhythm Nonspecific T wave abnormality Abnormal ECG When compared with ECG of 22-Dec-2021 13:29, Questionable change in QRS axis Nonspecific T wave abnormality now evident in Inferior leads QT has shortened Confirmed by Lennice Sites ([id number]) on 12/22/2021 2:40:33 PM  Radiology DG Chest 2 View  Result Date: 12/22/2021 CLINICAL DATA:  Chest pain EXAM: CHEST - 2 VIEW COMPARISON:  02/20/2018 FINDINGS: Mild cardiomegaly. No focal airspace consolidation, pleural effusion, or pneumothorax. No acute bony findings. IMPRESSION: Mild cardiomegaly. No acute cardiopulmonary findings. Electronically Signed   By: Davina Poke D.O.   On: 12/22/2021 14:32    Procedures Procedures  {Document cardiac monitor, telemetry assessment procedure when appropriate:1}  Medications Ordered in ED Medications  dextrose 50 % solution 50 mL (50 mLs Intravenous Given 12/22/21 1444)    ED Course/ Medical Decision Making/ A&P                            51 year old female with a history of hyperlipidemia, hypertension, diabetes, who presents with concern for chest pain.  Differential diagnosis for chest pain includes pulmonary embolus, dissection, pneumothorax, pneumonia, ACS, myocarditis, pericarditis.  EKG was done and evaluate by me and showed no acute ST changes and no signs of pericarditis. Chest x-ray was done and evaluated by me  and radiology and showed no sign of pneumonia or pneumothorax. Patient is low risk Wells*** and have low suspicion for PE.  She had delta troponins which were both negative***.  Do not feel history or exam are consistent with aortic dissection.  Has pleuritic, nonexertional pain and do not feel admission indicated at this time--however given risk factors will refer to Cardiology for follow up and discussed return precautions.     {Document critical care time when appropriate:1} {Document review of labs and clinical decision tools ie heart score, Chads2Vasc2 etc:1}  {Document your independent review of radiology images, and any outside records:1} {Document your discussion with family members, caretakers, and with consultants:1} {Document social determinants of health affecting pt's care:1} {  Document your decision making why or why not admission, treatments were needed:1} Final Clinical Impression(s) / ED Diagnoses Final diagnoses:  None    Rx / DC Orders ED Discharge Orders     None

## 2021-12-23 ENCOUNTER — Telehealth: Payer: Self-pay

## 2021-12-23 NOTE — Telephone Encounter (Signed)
Transition Care Management Follow-up Telephone Call Date of discharge and from where: 12/22/2021 [facility] How have you been since you were released from the hospital? I'm doing ok. I am still haviing some chest pain when I breathe, so I'm wondering if I have pulled a muscle or something. Any questions or concerns? No  Items Reviewed: Did the pt receive and understand the discharge instructions provided? Yes  Medications obtained and verified? No  Other? No  Any new allergies since your discharge? No  Dietary orders reviewed? No Do you have support at home? Yes   Home Care and Equipment/Supplies: Were home health services ordered? not applicable If so, what is the name of the agency? N/a  Has the agency set up a time to come to the patient's home? not applicable Were any new equipment or medical supplies ordered?  No What is the name of the medical supply agency? N/a Were you able to get the supplies/equipment? not applicable Do you have any questions related to the use of the equipment or supplies? No  Functional Questionnaire: (I = Independent and D = Dependent) ADLs: I  Bathing/Dressing- I  Meal Prep- I  Eating- I  Maintaining continence- I  Transferring/Ambulation- I  Managing Meds- I  Follow up appointments reviewed:  PCP Hospital f/u appt confirmed? Yes  Scheduled to see Wilfred Lacy on 12/25/21 @ 11:20am. [facility] f/u appt confirmed? No  Scheduled to see N/A on N/A @ N/A. Are transportation arrangements needed? No  If their condition worsens, is the pt aware to call PCP or go to the Emergency Dept.? Yes Was the patient provided with contact information for the PCP's office or ED? Yes Was to pt encouraged to call back with questions or concerns? Yes   Angeline Slim, RN, BSN

## 2021-12-25 ENCOUNTER — Inpatient Hospital Stay: Payer: Commercial Managed Care - PPO | Admitting: Nurse Practitioner

## 2022-01-04 ENCOUNTER — Encounter: Payer: Self-pay | Admitting: Nurse Practitioner

## 2022-01-04 ENCOUNTER — Ambulatory Visit (INDEPENDENT_AMBULATORY_CARE_PROVIDER_SITE_OTHER): Payer: Commercial Managed Care - PPO | Admitting: Nurse Practitioner

## 2022-01-04 VITALS — BP 136/90 | HR 85 | Temp 97.5°F | Ht 69.0 in | Wt 237.4 lb

## 2022-01-04 DIAGNOSIS — I10 Essential (primary) hypertension: Secondary | ICD-10-CM | POA: Diagnosis not present

## 2022-01-04 DIAGNOSIS — E785 Hyperlipidemia, unspecified: Secondary | ICD-10-CM

## 2022-01-04 DIAGNOSIS — E1169 Type 2 diabetes mellitus with other specified complication: Secondary | ICD-10-CM | POA: Diagnosis not present

## 2022-01-04 DIAGNOSIS — E1165 Type 2 diabetes mellitus with hyperglycemia: Secondary | ICD-10-CM

## 2022-01-04 MED ORDER — ROSUVASTATIN CALCIUM 20 MG PO TABS: 20.0000 mg | ORAL_TABLET | Freq: Every day | ORAL | 3 refills | Status: DC

## 2022-01-04 NOTE — Assessment & Plan Note (Addendum)
BP not at goal with losartan '100mg'$  Admits to poor diet and lack of exercise. She states she does not want to add BP medication if possible BP Readings from Last 3 Encounters:  01/04/22 (!) 136/90  12/22/21 137/85  12/22/21 (!) 139/103   Advised about her risk of CAD and cerebrovascular disease. We talked about the importance of incorporating DASH diet and daily exercise, to help improve glucose and BP control. She verbalized understanding.

## 2022-01-04 NOTE — Patient Instructions (Signed)
Schedule fasting lab appt in 2weeks. Monitor BP at home  and glucose in AM 3x/week Send readings via mychart.

## 2022-01-04 NOTE — Progress Notes (Signed)
Established Patient Visit  Patient: Barbara [name]   DOB: 02-Dec-1970   51 y.o. Female  MRN: [id number] Visit Date: 01/04/2022  Subjective:    Chief Complaint  Patient presents with   [facility] f/u from 12/22/21 Says things have been a lot better  Requesting records for eye exam   HPI HYPERTENSION, BENIGN SYSTEMIC BP not at goal with losartan 197m Admits to poor diet and lack of exercise. She states she does not want to add BP medication if possible BP Readings from Last 3 Encounters:  01/04/22 (!) 136/90  12/22/21 137/85  12/22/21 (!) 139/103   Advised about her risk of CAD and cerebrovascular disease. We talked about the importance of incorporating DASH diet and daily exercise, to help improve glucose and BP control. She verbalized understanding. Ms. [employer] resolved chest pain after urgent care and ED visit on 12/22/2021. She had intermittent chest wall pain which did not resolve with aspirin. Associated with LE paresthesia. She has negative cardiac and PE/DVT workup. She described pain as dull, arching and can be reproduced by palpation of mid chestwall. Pain resolved before discharge from ED She has an Appt with cardiology 01/19/22.   Reviewed medical, surgical, and social history today  Medications: Outpatient Medications Prior to Visit  Medication Sig   blood glucose meter kit and supplies Dispense based on patient and insurance preference. Check glucose Daily in AM. ICD10: E11.65   glipiZIDE (GLUCOTROL XL) 10 MG 24 hr tablet Take 1 tablet (10 mg total) by mouth daily with breakfast.   losartan (COZAAR) 100 MG tablet Take 1 tablet (100 mg total) by mouth daily.   metFORMIN (GLUCOPHAGE-XR) 750 MG 24 hr tablet TAKE 1 TAB WITH BREAKFAST   [DISCONTINUED] rosuvastatin (CRESTOR) 10 MG tablet TAKE 1 TABLET EVERY DAY   [DISCONTINUED] glipiZIDE (GLUCOTROL XL) 5 MG 24 hr tablet TAKE 1 TAB WITH BREAKFAST (Patient not taking: Reported on  01/04/2022)   [DISCONTINUED] mupirocin ointment (BACTROBAN) 2 % Place 1 Application into the nose 2 (two) times daily. Apply with dressing change (Patient not taking: Reported on 01/04/2022)   [DISCONTINUED] rosuvastatin (CRESTOR) 20 MG tablet Take 1 tablet (20 mg total) by mouth daily. (Patient not taking: Reported on 01/04/2022)   [DISCONTINUED] sulfamethoxazole-trimethoprim (BACTRIM DS) 800-160 MG tablet Take 1 tablet by mouth 2 (two) times daily. (Patient not taking: Reported on 01/04/2022)   No facility-administered medications prior to visit.   Reviewed past medical and social history.   ROS per HPI above  Last CBC Lab Results  Component Value Date   WBC 6.7 12/22/2021   HGB 12.1 12/22/2021   HCT 36.6 12/22/2021   MCV 89.3 12/22/2021   MCH 29.5 12/22/2021   RDW 13.7 12/22/2021   PLT 334 022/33/6122  Last metabolic panel Lab Results  Component Value Date   GLUCOSE 72 12/22/2021   NA 141 12/22/2021   K 3.6 12/22/2021   CL 104 12/22/2021   CO2 26 12/22/2021   BUN 13 12/22/2021   CREATININE 0.89 12/22/2021   GFRNONAA >60 12/22/2021   CALCIUM 9.5 12/22/2021   PROT 8.0 10/13/2021   ALBUMIN 4.7 10/13/2021   BILITOT 0.4 10/13/2021   ALKPHOS 87 10/13/2021   AST 17 10/13/2021   ALT 22 10/13/2021   ANIONGAP 11 12/22/2021        Objective:  BP (!) 136/90   Pulse 85   Temp (!)  97.5 F (36.4 C) (Temporal)   Ht _0  (1.753 m)   Wt 237 lb 6.4 oz (107.7 kg)   LMP 09/16/2016 (Approximate)   SpO2 98%   BMI 35.06 kg/m      Physical Exam Vitals reviewed.  Cardiovascular:     Rate and Rhythm: Normal rate and regular rhythm.     Pulses: Normal pulses.     Heart sounds: Normal heart sounds.  Pulmonary:     Effort: Pulmonary effort is normal.     Breath sounds: Normal breath sounds.  Musculoskeletal:     Right lower leg: No edema.     Left lower leg: No edema.  Neurological:     Mental Status: She is alert and oriented to person, place, and time.     No results  found for any visits on 01/04/22.    Assessment & Plan:    Problem List Items Addressed This Visit       Cardiovascular and Mediastinum   HYPERTENSION, BENIGN SYSTEMIC - Primary    BP not at goal with losartan 174m Admits to poor diet and lack of exercise. She states she does not want to add BP medication if possible BP Readings from Last 3 Encounters:  01/04/22 (!) 136/90  12/22/21 137/85  12/22/21 (!) 139/103   Advised about her risk of CAD and cerebrovascular disease. We talked about the importance of incorporating DASH diet and daily exercise, to help improve glucose and BP control. She verbalized understanding.      Relevant Medications   rosuvastatin (CRESTOR) 20 MG tablet     Endocrine   DM (diabetes mellitus) (HCC)   Relevant Medications   rosuvastatin (CRESTOR) 20 MG tablet   Other Relevant Orders   Hemoglobin A1c   Hyperlipidemia associated with type 2 diabetes mellitus ([place]   Relevant Medications   rosuvastatin (CRESTOR) 20 MG tablet   Other Relevant Orders   Lipid panel   Return in about 3 months (around 04/06/2022) for DM, HTN, hyperlipidemia (fasting).     CWilfred Lacy NP

## 2022-01-18 ENCOUNTER — Other Ambulatory Visit (INDEPENDENT_AMBULATORY_CARE_PROVIDER_SITE_OTHER): Payer: Commercial Managed Care - PPO

## 2022-01-18 ENCOUNTER — Other Ambulatory Visit: Payer: Self-pay | Admitting: Nurse Practitioner

## 2022-01-18 DIAGNOSIS — E1169 Type 2 diabetes mellitus with other specified complication: Secondary | ICD-10-CM

## 2022-01-18 DIAGNOSIS — E785 Hyperlipidemia, unspecified: Secondary | ICD-10-CM | POA: Diagnosis not present

## 2022-01-18 DIAGNOSIS — Z1231 Encounter for screening mammogram for malignant neoplasm of breast: Secondary | ICD-10-CM

## 2022-01-18 DIAGNOSIS — E1165 Type 2 diabetes mellitus with hyperglycemia: Secondary | ICD-10-CM | POA: Diagnosis not present

## 2022-01-18 LAB — HEMOGLOBIN A1C: Hgb A1c MFr Bld: 7.8 % — ABNORMAL HIGH (ref 4.6–6.5)

## 2022-01-18 LAB — LIPID PANEL
Cholesterol: 152 mg/dL (ref 0–200)
HDL: 58.9 mg/dL (ref >39.00)
LDL Cholesterol: 82 mg/dL (ref 0–99)
NonHDL: 92.7
Total CHOL/HDL Ratio: 3
Triglycerides: 55 mg/dL (ref 0.0–149.0)
VLDL: 11 mg/dL (ref 0.0–40.0)

## 2022-01-19 ENCOUNTER — Ambulatory Visit: Payer: Commercial Managed Care - PPO | Attending: Interventional Cardiology | Admitting: Interventional Cardiology

## 2022-01-19 ENCOUNTER — Encounter: Payer: Self-pay | Admitting: Interventional Cardiology

## 2022-01-19 VITALS — BP 140/78 | HR 87 | Ht 69.0 in | Wt 238.0 lb

## 2022-01-19 DIAGNOSIS — I10 Essential (primary) hypertension: Secondary | ICD-10-CM

## 2022-01-19 DIAGNOSIS — R072 Precordial pain: Secondary | ICD-10-CM | POA: Diagnosis not present

## 2022-01-19 DIAGNOSIS — E119 Type 2 diabetes mellitus without complications: Secondary | ICD-10-CM

## 2022-01-19 DIAGNOSIS — I517 Cardiomegaly: Secondary | ICD-10-CM | POA: Diagnosis not present

## 2022-01-19 DIAGNOSIS — E782 Mixed hyperlipidemia: Secondary | ICD-10-CM

## 2022-01-19 NOTE — Progress Notes (Signed)
Cardiology Office Note   Date:  01/19/2022   ID:  Barbara [name], DOB 05-06-1970, MRN [id number]  PCP:  Flossie Buffy, NP    No chief complaint on file.  Chest pain  Wt Readings from Last 3 Encounters:  01/19/22 238 lb (108 kg)  01/04/22 237 lb 6.4 oz (107.7 kg)  12/22/21 237 lb 10.5 oz (107.8 kg)       History of Present Illness: Barbara [name] is a 51 y.o. female who is being seen today for the evaluation of chest pain at the request of Nche, Charlene Brooke, NP.   Went to ER in 9/23 after having several hours of chest pain.  Records show: "51 year old female with a history of hyperlipidemia, hypertension, diabetes, who presents with concern for chest pain.   The chest pain started this morning.  Is located in the center of the chest and comes and goes.  It is dull, worse with deep breaths.  Denies any radiation of pain, although did have some tingling of left arm. Denies exertional pain.  Had headache but believe that was due to hypoglycemia and improved after glucose administration. Denies other weakness, difficulty talking or walking, visual changes or facial droop.  No nausea, vomiting, dyspnea. Did have lightheadedness while pumping gas on way here. Seen at urgent care prior to presentation.   No immediate family hx of CAD but does have hx in great grandmother.  No smoking/etoh/drugs. No hx of DVT/PE, no recent surgeries, immobilization, long trips. "  Chest pains lasted for a few hours and did not resolve with aspirin.   Recs from ER: "EKG was done and evaluate by me and showed no acute ST changes and no signs of pericarditis. Chest x-ray was done and evaluated by me and radiology and showed no sign of pneumonia or pneumothorax. Patient is low risk Wells with a negative ddimer and have low suspicion for PE.  She had delta troponins which were both negative.  Do not feel history or exam are consistent with aortic dissection.  Has pleuritic, nonexertional pain  and do not feel admission indicated at this time"  Diagnosed with DM 2 years ago. Had gestational DM.   Since ER visit, Denies : Chest pain. Dizziness. Leg edema. Nitroglycerin use. Orthopnea. Palpitations. Paroxysmal nocturnal dyspnea. Shortness of breath. Syncope.    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   CESAREAN SECTION  02-04-1999    vbac 1-06 and 05-19-2005   ERCP W/ SPHINCTEROTOMY AND BALLOON DILATION  10/11/2005   LAPAROSCOPIC CHOLECYSTECTOMY  10/04/2005   w/ ERCP   LAPAROSCOPIC VAGINAL HYSTERECTOMY WITH SALPINGO OOPHORECTOMY Left 09/28/2016   Procedure: LAPAROSCOPIC ASSISTED VAGINAL HYSTERECTOMY WITH SALPINGO OOPHORECTOMY, right salpingectomy;  Surgeon: Dian Queen, MD;  Location: [facility];  Service:  Gynecology;  Laterality: Left;  NEED BED FOR OVERNIGHT STAY     Current Outpatient Medications  Medication Sig Dispense Refill   blood glucose meter kit and supplies Dispense based on patient and insurance preference. Check glucose Daily in AM. ICD10: E11.65 1 each 0   glipiZIDE (GLUCOTROL XL) 10 MG 24 hr tablet Take 1 tablet (10 mg total) by mouth daily with breakfast. 90 tablet 3   losartan (COZAAR) 100 MG tablet Take 1 tablet (100 mg total) by mouth daily. 90 tablet 3   metFORMIN (GLUCOPHAGE-XR) 750 MG 24 hr tablet TAKE 1 TAB WITH BREAKFAST 90  tablet 1   rosuvastatin (CRESTOR) 20 MG tablet Take 1 tablet (20 mg total) by mouth daily. 90 tablet 3   No current facility-administered medications for this visit.    Allergies:   Patient has no known allergies.    Social History:  The patient  reports that she has never smoked. She has never used smokeless tobacco. She reports that she does not drink alcohol and does not use drugs.   Family History:  The patient's family history includes Breast cancer in her maternal grandmother; Cancer in her maternal grandmother; Diabetes in her mother; Hypertension in her mother.    ROS:  Please see the history of present illness.   Otherwise, review of systems are positive for chest pain a month ago; knee pain with walking.   All other systems are reviewed and negative.    PHYSICAL EXAM: VS:  BP (!) 140/78   Pulse 87   Ht _0  (1.753 m)   Wt 238 lb (108 kg)   LMP 09/16/2016 (Approximate)   SpO2 97%   BMI 35.15 kg/m  , BMI Body mass index is 35.15 kg/m. GEN: Well nourished, well developed, in no acute distress HEENT: normal Neck: no JVD, carotid bruits, or masses Cardiac: RRR; no murmurs, rubs, or gallops,no edema  Respiratory:  clear to auscultation bilaterally, normal work of breathing GI: soft, nontender, nondistended, + BS MS: no deformity or atrophy Skin: warm and dry, no rash Neuro:  Strength and sensation are intact Psych: euthymic mood, full affect   EKG:   The ekg ordered 9/23 demonstrates NSR, non specific ST changes   Recent Labs: 10/13/2021: ALT 22 12/22/2021: BUN 13; Creatinine, Ser 0.89; Hemoglobin 12.1; Platelets 334; Potassium 3.6; Sodium 141   Lipid Panel    Component Value Date/Time   CHOL 152 01/18/2022 0935   TRIG 55.0 01/18/2022 0935   HDL 58.90 01/18/2022 0935   CHOLHDL 3 01/18/2022 0935   VLDL 11.0 01/18/2022 0935   LDLCALC 82 01/18/2022 0935   LDLDIRECT 145.1 10/26/2012 0827     Other studies Reviewed: Additional studies/ records that were  reviewed today with results demonstrating: labs reviewed.  Troponin negative.  ER records reviewed   ASSESSMENT AND PLAN:  Chest pain: No recurrent sx in the past month.  Atypical pain.  No problems with exertion recently.  We discussed stress testing versus CTA at this time.  Given that she has not had symptoms in a month, we will hold off.  She will try to improve her lifestyle with some gradual exercise.  Target noted below.  If she notes any changes in her exercise tolerance or problems with walking, she will let us know and we will perform ischemic testing. DM: A1c 8.0 in July 2023.  Increasing exercise should help lower the A1c to the target which is below 7.  Decreased to 7.8 in October 2023.  Avoid processed foods. HTN: Low salt.  Diagnosed with HTN at age 70.  Managed with lifestyle for many years. Started meds a few years ago.  Hyperlipidemia: Given the diabetes, would aim for an LDL target at least less than 100.  LDL 82 in 10/23.  Whole food, plant-based diet recommended. Cardiomegaly noted on chest x-ray: check echo to look for LVH.     Current medicines are reviewed at length with the patient today.  The patient concerns regarding her medicines were addressed.  The following changes have been made:  No change  Labs/ tests ordered today include:  No orders of the defined types were placed in this encounter.   Recommend 150 minutes/week of aerobic exercise Low fat, low carb, high fiber diet recommended  Disposition:   FU in 1 year   Signed, Larae Grooms, MD  01/19/2022 9:57 AM    [facility] [address], [place], [place]  [address] Phone: [phone]; Fax: [phone]

## 2022-01-19 NOTE — Patient Instructions (Signed)
Medication Instructions:  Your physician recommends that you continue on your current medications as directed. Please refer to the Current Medication list given to you today.  *If you need a refill on your cardiac medications before your next appointment, please call your pharmacy*   Lab Work: none If you have labs (blood work) drawn today and your tests are completely normal, you will receive your results only by: [place] (if you have MyChart) OR A paper copy in the mail If you have any lab test that is abnormal or we need to change your treatment, we will call you to review the results.   Testing/Procedures: Your physician has requested that you have an echocardiogram. Echocardiography is a painless test that uses sound waves to create images of your heart. It provides your doctor with information about the size and shape of your heart and how well your heart's chambers and valves are working. This procedure takes approximately one hour. There are no restrictions for this procedure. Please do NOT wear cologne, perfume, aftershave, or lotions (deodorant is allowed). Please arrive 15 minutes prior to your appointment time.    Follow-Up: At [facility], you and your health needs are our priority.  As part of our continuing mission to provide you with exceptional heart care, we have created designated Provider Care Teams.  These Care Teams include your primary Cardiologist (physician) and Advanced Practice Providers (APPs -  Physician Assistants and Nurse Practitioners) who all work together to provide you with the care you need, when you need it.  We recommend signing up for the patient portal called "MyChart".  Sign up information is provided on this After Visit Summary.  MyChart is used to connect with patients for Virtual Visits (Telemedicine).  Patients are able to view lab/test results, encounter notes, upcoming appointments, etc.  Non-urgent messages can be sent to your  provider as well.   To learn more about what you can do with MyChart, go to [email or web address].    Your next appointment:   12 month(s)  The format for your next appointment:   In Person  Provider:   Larae Grooms, MD     Other Instructions  High-Fiber Eating Plan Fiber, also called dietary fiber, is a type of carbohydrate. It is found foods such as fruits, vegetables, whole grains, and beans. A high-fiber diet can have many health benefits. Your health care provider may recommend a high-fiber diet to help: Prevent constipation. Fiber can make your bowel movements more regular. Lower your cholesterol. Relieve the following conditions: Inflammation of veins in the anus (hemorrhoids). Inflammation of specific areas of the digestive tract (uncomplicated diverticulosis). A problem of the large intestine, also called the colon, that sometimes causes pain and diarrhea (irritable bowel syndrome, or IBS). Prevent overeating as part of a weight-loss plan. Prevent heart disease, type 2 diabetes, and certain cancers. What are tips for following this plan? Reading food labels  Check the nutrition facts label on food products for the amount of dietary fiber. Choose foods that have 5 grams of fiber or more per serving. The goals for recommended daily fiber intake include: Men (age 80 or younger): 34-38 g. Men (over age 40): 28-34 g. Women (age 13 or younger): 25-28 g. Women (over age 41): 22-25 g. Your daily fiber goal is _____________ g. Shopping Choose whole fruits and vegetables instead of processed forms, such as apple juice or applesauce. Choose a wide variety of high-fiber foods such as avocados, lentils, oats, and kidney beans.  Read the nutrition facts label of the foods you choose. Be aware of foods with added fiber. These foods often have high sugar and sodium amounts per serving. Cooking Use whole-grain flour for baking and cooking. Cook with brown rice instead of white  rice. Meal planning Start the day with a breakfast that is high in fiber, such as a cereal that contains 5 g of fiber or more per serving. Eat breads and cereals that are made with whole-grain flour instead of refined flour or white flour. Eat brown rice, bulgur wheat, or millet instead of white rice. Use beans in place of meat in soups, salads, and pasta dishes. Be sure that half of the grains you eat each day are whole grains. General information You can get the recommended daily intake of dietary fiber by: Eating a variety of fruits, vegetables, grains, nuts, and beans. Taking a fiber supplement if you are not able to take in enough fiber in your diet. It is better to get fiber through food than from a supplement. Gradually increase how much fiber you consume. If you increase your intake of dietary fiber too quickly, you may have bloating, cramping, or gas. Drink plenty of water to help you digest fiber. Choose high-fiber snacks, such as berries, raw vegetables, nuts, and popcorn. What foods should I eat? Fruits Berries. Pears. Apples. Oranges. Avocado. Prunes and raisins. Dried figs. Vegetables Sweet potatoes. Spinach. Kale. Artichokes. Cabbage. Broccoli. Cauliflower. Green peas. Carrots. Squash. Grains Whole-grain breads. Multigrain cereal. Oats and oatmeal. Brown rice. Barley. Bulgur wheat. [place]. Quinoa. Bran muffins. Popcorn. Rye wafer crackers. Meats and other proteins Navy beans, kidney beans, and pinto beans. Soybeans. Split peas. Lentils. Nuts and seeds. Dairy Fiber-fortified yogurt. Beverages Fiber-fortified soy milk. Fiber-fortified orange juice. Other foods Fiber bars. The items listed above may not be a complete list of recommended foods and beverages. Contact a dietitian for more information. What foods should I avoid? Fruits Fruit juice. Cooked, strained fruit. Vegetables Fried potatoes. Canned vegetables. Well-cooked vegetables. Grains White bread. Pasta made  with refined flour. White rice. Meats and other proteins Fatty cuts of meat. Fried chicken or fried fish. Dairy Milk. Yogurt. Cream cheese. Sour cream. Fats and oils Butters. Beverages Soft drinks. Other foods Cakes and pastries. The items listed above may not be a complete list of foods and beverages to avoid. Talk with your dietitian about what choices are best for you. Summary Fiber is a type of carbohydrate. It is found in foods such as fruits, vegetables, whole grains, and beans. A high-fiber diet has many benefits. It can help to prevent constipation, lower blood cholesterol, aid weight loss, and reduce your risk of heart disease, diabetes, and certain cancers. Increase your intake of fiber gradually. Increasing fiber too quickly may cause cramping, bloating, and gas. Drink plenty of water while you increase the amount of fiber you consume. The best sources of fiber include whole fruits and vegetables, whole grains, nuts, seeds, and beans. This information is not intended to replace advice given to you by your health care provider. Make sure you discuss any questions you have with your health care provider. Document Revised: 07/26/2019 Document Reviewed: 07/26/2019 Elsevier Patient Education  [place]

## 2022-02-08 ENCOUNTER — Ambulatory Visit (HOSPITAL_COMMUNITY): Payer: Commercial Managed Care - PPO | Attending: Interventional Cardiology

## 2022-02-08 DIAGNOSIS — R072 Precordial pain: Secondary | ICD-10-CM | POA: Insufficient documentation

## 2022-02-08 DIAGNOSIS — I517 Cardiomegaly: Secondary | ICD-10-CM | POA: Insufficient documentation

## 2022-02-08 LAB — ECHOCARDIOGRAM COMPLETE
Area-P 1/2: 3.08 cm2
S' Lateral: 2.9 cm

## 2022-02-16 ENCOUNTER — Ambulatory Visit: Payer: Commercial Managed Care - PPO | Admitting: Nurse Practitioner

## 2022-02-22 ENCOUNTER — Telehealth: Payer: Self-pay | Admitting: Interventional Cardiology

## 2022-02-22 NOTE — Telephone Encounter (Signed)
Pt returning a call for echo results

## 2022-02-22 NOTE — Telephone Encounter (Signed)
I spoke with patient and reviewed echo results with her 

## 2022-02-22 NOTE — Telephone Encounter (Signed)
Left message to call office

## 2022-02-22 NOTE — Telephone Encounter (Signed)
-----   Message from Jettie Booze, MD sent at 02/09/2022  1:04 PM EST ----- Normal LV/RV Dewaine Oats function

## 2022-02-22 NOTE — Telephone Encounter (Signed)
Patient is returning call.  °

## 2022-03-03 ENCOUNTER — Ambulatory Visit
Admission: RE | Admit: 2022-03-03 | Discharge: 2022-03-03 | Disposition: A | Discharge status: Home / Self Care | Payer: Commercial Managed Care - PPO | Source: Ambulatory Visit | Attending: Nurse Practitioner | Admitting: Nurse Practitioner

## 2022-03-03 DIAGNOSIS — Z1231 Encounter for screening mammogram for malignant neoplasm of breast: Secondary | ICD-10-CM

## 2022-04-07 ENCOUNTER — Ambulatory Visit: Payer: Commercial Managed Care - PPO | Admitting: Nurse Practitioner

## 2022-04-15 ENCOUNTER — Encounter: Payer: Self-pay | Admitting: Nurse Practitioner

## 2022-04-15 ENCOUNTER — Ambulatory Visit (INDEPENDENT_AMBULATORY_CARE_PROVIDER_SITE_OTHER): Payer: Commercial Managed Care - PPO | Admitting: Nurse Practitioner

## 2022-04-15 VITALS — BP 136/84 | HR 80 | Temp 96.0°F | Ht 69.0 in | Wt 235.2 lb

## 2022-04-15 DIAGNOSIS — I10 Essential (primary) hypertension: Secondary | ICD-10-CM

## 2022-04-15 DIAGNOSIS — E1165 Type 2 diabetes mellitus with hyperglycemia: Secondary | ICD-10-CM | POA: Diagnosis not present

## 2022-04-15 DIAGNOSIS — M170 Bilateral primary osteoarthritis of knee: Secondary | ICD-10-CM | POA: Diagnosis not present

## 2022-04-15 LAB — RENAL FUNCTION PANEL
Albumin: 4.3 g/dL (ref 3.5–5.2)
BUN: 12 mg/dL (ref 6–23)
CO2: 28 mEq/L (ref 19–32)
Calcium: 9.1 mg/dL (ref 8.4–10.5)
Chloride: 103 mEq/L (ref 96–112)
Creatinine, Ser: 0.86 mg/dL (ref 0.40–1.20)
GFR: 79.16 mL/min (ref >60.00)
Glucose, Bld: 135 mg/dL — ABNORMAL HIGH (ref 70–99)
Phosphorus: 3.1 mg/dL (ref 2.3–4.6)
Potassium: 4.1 mEq/L (ref 3.5–5.1)
Sodium: 139 mEq/L (ref 135–145)

## 2022-04-15 LAB — HEMOGLOBIN A1C: Hgb A1c MFr Bld: 8.1 % — ABNORMAL HIGH (ref 4.6–6.5)

## 2022-04-15 NOTE — Assessment & Plan Note (Signed)
No glucose check at home No adverse effects with metformin and glipizide LDL at goal Current use of an ARB Repeat hgbA1c and BMP

## 2022-04-15 NOTE — Assessment & Plan Note (Signed)
Improved BP with losartan BP Readings from Last 3 Encounters:  04/15/22 136/84  01/19/22 (!) 140/78  01/04/22 (!) 136/90    Advised about importance of DASH diet and daily exercise Maintain med dose Repeat BMP F/up in 6month

## 2022-04-15 NOTE — Patient Instructions (Addendum)
Go to lab Maintain current medications Use voltaren gel on knees Let me know if you change you mind about referral to ortho  Kegel Exercises  Kegel exercises can help strengthen your pelvic floor muscles. The pelvic floor is a group of muscles that support your rectum, small intestine, and bladder. In females, pelvic floor muscles also help support the uterus. These muscles help you control the flow of urine and stool (feces). Kegel exercises are painless and simple. They do not require any equipment. Your provider may suggest Kegel exercises to: Improve bladder and bowel control. Improve sexual response. Improve weak pelvic floor muscles after surgery to remove the uterus (hysterectomy) or after pregnancy, in females. Improve weak pelvic floor muscles after prostate gland removal or surgery, in males. Kegel exercises involve squeezing your pelvic floor muscles. These are the same muscles you squeeze when you try to stop the flow of urine or keep from passing gas. The exercises can be done while sitting, standing, or lying down, but it is best to vary your position. Ask your health care provider which exercises are safe for you. Do exercises exactly as told by your health care provider and adjust them as directed. Do not begin these exercises until told by your health care provider. Exercises How to do Kegel exercises: Squeeze your pelvic floor muscles tight. You should feel a tight lift in your rectal area. If you are a female, you should also feel a tightness in your vaginal area. Keep your stomach, buttocks, and legs relaxed. Hold the muscles tight for up to 10 seconds. Breathe normally. Relax your muscles for up to 10 seconds. Repeat as told by your health care provider. Repeat this exercise daily as told by your health care provider. Continue to do this exercise for at least 4-6 weeks, or for as long as told by your health care provider. You may be referred to a physical therapist who can  help you learn more about how to do Kegel exercises. Depending on your condition, your health care provider may recommend: Varying how long you squeeze your muscles. Doing several sets of exercises every day. Doing exercises for several weeks. Making Kegel exercises a part of your regular exercise routine. This information is not intended to replace advice given to you by your health care provider. Make sure you discuss any questions you have with your health care provider. Document Revised: 07/31/2020 Document Reviewed: 07/31/2020 Elsevier Patient Education  [place].  Exercises for Chronic Knee Pain Chronic knee pain is pain that lasts longer than 3 months. For most people with chronic knee pain, exercise and weight loss is an important part of treatment. Your health care provider may want you to focus on: Strengthening the muscles that support your knee. This can take pressure off your knee and lessen pain. Preventing knee stiffness. Maintaining or increasing how far you can move your knee. Losing weight (if this applies) to take pressure off your knee, decrease your risk for injury, and make it easier for you to exercise. Your health care provider will help you develop an exercise program that matches your needs and physical abilities. Below are simple, low-impact exercises you can do at home. Ask your health care provider or a physical therapist how often you should do your exercise program and how many times to repeat each exercise. General safety tips Follow these safety tips for exercising with chronic knee pain: Get your health care provider's approval before doing any exercises. Start slowly and stop  any time an exercise causes pain. Do not exercise if your knee pain is flaring up. Warm up first. Stretching a cold muscle can cause an injury. Do 5-10 minutes of easy movement or light stretching before beginning your exercise routine. Do 5-10 minutes of low-impact activity  (like walking or cycling) before starting strengthening exercises. Contact your health care provider any time you have pain during or after exercising. Exercise may cause discomfort but should not be painful. It is normal to be a little stiff or sore after exercising.  Stretching and range-of-motion exercises Front thigh stretch  Stand up straight and support your body by holding on to a chair or resting one hand on a wall. With your legs straight and close together, bend one knee to lift your heel up toward your buttocks. Using one hand for support, grab your ankle with your free hand. Pull your foot up closer toward your buttocks to feel the stretch in front of your thigh. Hold the stretch for 30 seconds. Repeat __________ times. Complete this exercise __________ times a day. Back thigh stretch  Sit on the floor with your back straight and your legs out straight in front of you. Place the palms of your hands on the floor and slide them toward your feet as you bend at the hip. Try to touch your nose to your knees and feel the stretch in the back of your thighs. Hold for 30 seconds. Repeat __________ times. Complete this exercise __________ times a day. Calf stretch  Stand facing a wall. Place the palms of your hands flat against the wall, arms extended, and lean slightly against the wall. Get into a lunge position with one leg bent at the knee and the other leg stretched out straight behind you. Keep both feet facing the wall and increase the bend in your knee while keeping the heel of the other leg flat on the ground. You should feel the stretch in your calf. Hold for 30 seconds. Repeat __________ times. Complete this exercise __________ times a day. Strengthening exercises Straight leg lift Lie on your back with one knee bent and the other leg out straight. Slowly lift the straight leg without bending the knee. Lift until your foot is about 12 inches (30 cm) off the floor. Hold for  3-5 seconds and slowly lower your leg. Repeat __________ times. Complete this exercise __________ times a day. Single leg dip Stand between two chairs and put both hands on the backs of the chairs for support. Extend one leg out straight with your body weight resting on the heel of the standing leg. Slowly bend your standing knee to dip your body to the level that is comfortable for you. Hold for 3-5 seconds. Repeat __________ times. Complete this exercise __________ times a day. Hamstring curls Stand straight, knees close together, facing the back of a chair. Hold on to the back of a chair with both hands. Keep one leg straight. Bend the other knee while bringing the heel up toward the buttock until the knee is bent at a 90-degree angle (right angle). Hold for 3-5 seconds. Repeat __________ times. Complete this exercise __________ times a day. Wall squat Stand straight with your back, hips, and head against a wall. Step forward one foot at a time with your back still against the wall. Your feet should be 2 feet (61 cm) from the wall at shoulder width. Keeping your back, hips, and head against the wall, slide down the wall to as close  of a sitting position as you can get. Hold for 5-10 seconds, then slowly slide back up. Repeat __________ times. Complete this exercise __________ times a day. Step-ups Step up with one foot onto a sturdy platform or stool that is about 6 inches (15 cm) high. Face sideways with one foot on the platform and one on the ground. Place all your weight on the platform foot and lift your body off the ground until your knee extends. Let your other leg hang free to the side. Hold for 3-5 seconds then slowly lower your weight down to the floor foot. Repeat __________ times. Complete this exercise __________ times a day. Contact a health care provider if: Your exercise causes pain. Your pain is worse after you exercise. Your pain prevents you from doing your  exercises. This information is not intended to replace advice given to you by your health care provider. Make sure you discuss any questions you have with your health care provider. Document Revised: 07/26/2019 Document Reviewed: 03/19/2019 Elsevier Patient Education  [place].

## 2022-04-15 NOTE — Progress Notes (Signed)
Established Patient Visit  Patient: Barbara [name]   DOB: Sep 06, 1970   52 y.o. Female  MRN: [id number] Visit Date: 04/15/2022  Subjective:    Chief Complaint  Patient presents with   Office Visit    DM/ HTN/ hyperlipidemia  Pt fasting  Doesn't check BP or BS Knee pain    HPI HYPERTENSION, BENIGN SYSTEMIC Improved BP with losartan BP Readings from Last 3 Encounters:  04/15/22 136/84  01/19/22 (!) 140/78  01/04/22 (!) 136/90    Advised about importance of DASH diet and daily exercise Maintain med dose Repeat BMP F/up in 63month  DM (diabetes mellitus) (HCC) No glucose check at home No adverse effects with metformin and glipizide LDL at goal Current use of an ARB Repeat hgbA1c and BMP  Primary osteoarthritis of both knees Persistent knee pain and stiffness with prolonged sitting or immobility. Declined referral to ortho and for PT  Advised to continue use of voltaren gel, warm compress, and home exercises  Wt Readings from Last 3 Encounters:  04/15/22 235 lb 3.2 oz (106.7 kg)  01/19/22 238 lb (108 kg)  01/04/22 237 lb 6.4 oz (107.7 kg)    Reviewed medical, surgical, and social history today  Medications: Outpatient Medications Prior to Visit  Medication Sig   blood glucose meter kit and supplies Dispense based on patient and insurance preference. Check glucose Daily in AM. ICD10: E11.65   glipiZIDE (GLUCOTROL XL) 10 MG 24 hr tablet Take 1 tablet (10 mg total) by mouth daily with breakfast.   losartan (COZAAR) 100 MG tablet Take 1 tablet (100 mg total) by mouth daily.   metFORMIN (GLUCOPHAGE-XR) 750 MG 24 hr tablet TAKE 1 TAB WITH BREAKFAST   rosuvastatin (CRESTOR) 20 MG tablet Take 1 tablet (20 mg total) by mouth daily.   No facility-administered medications prior to visit.   Reviewed past medical and social history.   ROS per HPI above  Last metabolic panel Lab Results  Component Value Date   GLUCOSE 72 12/22/2021   NA 141 12/22/2021    K 3.6 12/22/2021   CL 104 12/22/2021   CO2 26 12/22/2021   BUN 13 12/22/2021   CREATININE 0.89 12/22/2021   GFRNONAA >60 12/22/2021   CALCIUM 9.5 12/22/2021   PROT 8.0 10/13/2021   ALBUMIN 4.7 10/13/2021   BILITOT 0.4 10/13/2021   ALKPHOS 87 10/13/2021   AST 17 10/13/2021   ALT 22 10/13/2021   ANIONGAP 11 12/22/2021   Last lipids Lab Results  Component Value Date   CHOL 152 01/18/2022   HDL 58.90 01/18/2022   LDLCALC 82 01/18/2022   LDLDIRECT 145.1 10/26/2012   TRIG 55.0 01/18/2022   CHOLHDL 3 01/18/2022   Last hemoglobin A1c Lab Results  Component Value Date   HGBA1C 7.8 (H) 01/18/2022      Objective:  BP 136/84   Pulse 80   Temp (!) 96 F (35.6 C) (Temporal)   Ht '5\' 9"'$  (1.753 m)   Wt 235 lb 3.2 oz (106.7 kg)   LMP 09/16/2016 (Approximate)   SpO2 95%   BMI 34.73 kg/m      Physical Exam Vitals and nursing note reviewed.  Cardiovascular:     Rate and Rhythm: Normal rate and regular rhythm.     Pulses: Normal pulses.     Heart sounds: Normal heart sounds.  Pulmonary:     Effort: Pulmonary effort is normal.     Breath  sounds: Normal breath sounds.  Musculoskeletal:     Right lower leg: No edema.     Left lower leg: No edema.  Neurological:     Mental Status: She is alert and oriented to person, place, and time.     No results found for any visits on 04/15/22.    Assessment & Plan:    Problem List Items Addressed This Visit       Cardiovascular and Mediastinum   HYPERTENSION, BENIGN SYSTEMIC - Primary    Improved BP with losartan BP Readings from Last 3 Encounters:  04/15/22 136/84  01/19/22 (!) 140/78  01/04/22 (!) 136/90    Advised about importance of DASH diet and daily exercise Maintain med dose Repeat BMP F/up in 1month      Relevant Orders   Renal Function Panel     Endocrine   DM (diabetes mellitus) (HCC)    No glucose check at home No adverse effects with metformin and glipizide LDL at goal Current use of an ARB Repeat  hgbA1c and BMP      Relevant Orders   Renal Function Panel   Hemoglobin A1c     Musculoskeletal and Integument   Primary osteoarthritis of both knees    Persistent knee pain and stiffness with prolonged sitting or immobility. Declined referral to ortho and for PT  Advised to continue use of voltaren gel, warm compress, and home exercises      Return in about 3 months (around 07/15/2022) for HTN, DM, hyperlipidemia (fasting).     CWilfred Lacy NP

## 2022-04-15 NOTE — Assessment & Plan Note (Signed)
Persistent knee pain and stiffness with prolonged sitting or immobility. Declined referral to ortho and for PT  Advised to continue use of voltaren gel, warm compress, and home exercises

## 2022-04-18 ENCOUNTER — Other Ambulatory Visit: Payer: Self-pay | Admitting: Nurse Practitioner

## 2022-04-18 DIAGNOSIS — E1165 Type 2 diabetes mellitus with hyperglycemia: Secondary | ICD-10-CM

## 2022-04-18 MED ORDER — METFORMIN HCL ER 750 MG PO TB24: 750.0000 mg | ORAL_TABLET | Freq: Two times a day (BID) | ORAL | 1 refills | Status: DC

## 2022-05-26 ENCOUNTER — Other Ambulatory Visit: Payer: Self-pay | Admitting: Nurse Practitioner

## 2022-05-26 DIAGNOSIS — E1165 Type 2 diabetes mellitus with hyperglycemia: Secondary | ICD-10-CM

## 2022-09-23 ENCOUNTER — Telehealth: Payer: Self-pay | Admitting: Nurse Practitioner

## 2022-09-23 NOTE — Telephone Encounter (Signed)
Pt called and said she seeing squiggly line in her eye what should she do

## 2022-09-23 NOTE — Telephone Encounter (Signed)
Attempted to call the pt. However she was occupied and asked that we communicated via Mychart. Message has been sent to the pt.

## 2022-10-22 ENCOUNTER — Encounter: Payer: Commercial Managed Care - PPO | Admitting: Nurse Practitioner

## 2022-10-26 ENCOUNTER — Ambulatory Visit (INDEPENDENT_AMBULATORY_CARE_PROVIDER_SITE_OTHER): Payer: Commercial Managed Care - PPO | Admitting: Nurse Practitioner

## 2022-10-26 ENCOUNTER — Encounter: Payer: Self-pay | Admitting: Nurse Practitioner

## 2022-10-26 VITALS — BP 142/96 | HR 83 | Temp 97.3°F | Ht 69.0 in | Wt 235.4 lb

## 2022-10-26 DIAGNOSIS — E1169 Type 2 diabetes mellitus with other specified complication: Secondary | ICD-10-CM | POA: Diagnosis not present

## 2022-10-26 DIAGNOSIS — F4323 Adjustment disorder with mixed anxiety and depressed mood: Secondary | ICD-10-CM

## 2022-10-26 DIAGNOSIS — L739 Follicular disorder, unspecified: Secondary | ICD-10-CM | POA: Diagnosis not present

## 2022-10-26 DIAGNOSIS — Z7984 Long term (current) use of oral hypoglycemic drugs: Secondary | ICD-10-CM

## 2022-10-26 DIAGNOSIS — I10 Essential (primary) hypertension: Secondary | ICD-10-CM

## 2022-10-26 DIAGNOSIS — E785 Hyperlipidemia, unspecified: Secondary | ICD-10-CM

## 2022-10-26 DIAGNOSIS — Z0001 Encounter for general adult medical examination with abnormal findings: Secondary | ICD-10-CM | POA: Diagnosis not present

## 2022-10-26 DIAGNOSIS — E1165 Type 2 diabetes mellitus with hyperglycemia: Secondary | ICD-10-CM | POA: Diagnosis not present

## 2022-10-26 LAB — MICROALBUMIN / CREATININE URINE RATIO
Creatinine,U: 231.1 mg/dL
Microalb Creat Ratio: 0.9 mg/g (ref 0.0–30.0)
Microalb, Ur: 2.1 mg/dL — ABNORMAL HIGH (ref 0.0–1.9)

## 2022-10-26 LAB — LIPID PANEL
Cholesterol: 185 mg/dL (ref 0–200)
HDL: 66.6 mg/dL (ref >39.00)
LDL Cholesterol: 105 mg/dL — ABNORMAL HIGH (ref 0–99)
NonHDL: 118.58
Total CHOL/HDL Ratio: 3
Triglycerides: 68 mg/dL (ref 0.0–149.0)
VLDL: 13.6 mg/dL (ref 0.0–40.0)

## 2022-10-26 LAB — COMPREHENSIVE METABOLIC PANEL
ALT: 27 U/L (ref 0–35)
AST: 17 U/L (ref 0–37)
Albumin: 4.5 g/dL (ref 3.5–5.2)
Alkaline Phosphatase: 81 U/L (ref 39–117)
BUN: 14 mg/dL (ref 6–23)
CO2: 29 mEq/L (ref 19–32)
Calcium: 9.6 mg/dL (ref 8.4–10.5)
Chloride: 101 mEq/L (ref 96–112)
Creatinine, Ser: 0.85 mg/dL (ref 0.40–1.20)
GFR: 79.98 mL/min (ref >60.00)
Glucose, Bld: 169 mg/dL — ABNORMAL HIGH (ref 70–99)
Potassium: 4.1 mEq/L (ref 3.5–5.1)
Sodium: 139 mEq/L (ref 135–145)
Total Bilirubin: 0.4 mg/dL (ref 0.2–1.2)
Total Protein: 7.5 g/dL (ref 6.0–8.3)

## 2022-10-26 LAB — HEMOGLOBIN A1C: Hgb A1c MFr Bld: 8.6 % — ABNORMAL HIGH (ref 4.6–6.5)

## 2022-10-26 MED ORDER — MUPIROCIN 2 % EX OINT: 1.0000 | TOPICAL_OINTMENT | Freq: Two times a day (BID) | CUTANEOUS | 0 refills | Status: DC

## 2022-10-26 NOTE — Progress Notes (Signed)
Complete physical exam  Patient: Barbara [name]   DOB: May 18, 1970   52 y.o. Female  MRN: [id number] Visit Date: 10/28/2022  Subjective:    Chief Complaint  Patient presents with   Annual Exam    CPE. Pt is fasting. Therapist wants pt to discuss her anxiety. Also wants to show xray from her eye doctor.    Insect Bite    Possible insect bite on right breast x 2 days. Pt concern because she saw white drainage.   Barbara [name] is a 52 y.o. female who presents today for a complete physical exam. She reports consuming a general diet.  Not consistent  She generally feels well. She reports sleeping well. She does have additional problems to discuss today.  Vision:Yes Dental:No STD Screen:No  BP Readings from Last 3 Encounters:  10/26/22 (!) 142/96  04/15/22 136/84  01/19/22 (!) 140/78   Wt Readings from Last 3 Encounters:  10/26/22 235 lb 6.4 oz (106.8 kg)  04/15/22 235 lb 3.2 oz (106.7 kg)  01/19/22 238 lb (108 kg)   Most recent fall risk assessment:    10/26/2022   10:25 AM  Fall Risk   Number falls in past yr: 0  Injury with Fall? 0  Risk for fall due to : No Fall Risks  Follow up Falls evaluation completed   Depression screen:Yes - Depression  Most recent depression screenings:    10/26/2022   11:41 AM 11/16/2021    9:53 AM  PHQ 2/9 Scores  PHQ - 2 Score 0 0  PHQ- 9 Score 8 4    Rash This is a new problem. The current episode started in the past 7 days. The affected locations include the chest. The rash is characterized by redness. She was exposed to nothing. Pertinent negatives include no fever. Past treatments include nothing. There is no history of allergies or eczema.    Adjustment disorder with mixed anxiety and depressed mood Chronic, waxing and waning, ongoing CBT sessions with psychologist. Denies need for medication at this time  DM (diabetes mellitus) (HCC) Uncontrolled DIABETES:  Reports compliance with metformin and glipizide Negative  retinopathy and neuropathy BP not at goal Current use of crestor Repeat UACr, hgbA1c, lipid panel anc CMP hgbA1c remains at 8.6% Add rybelsus 3mg  daily. LDL at goal Normal CMP F/up in 3months  HYPERTENSION, BENIGN SYSTEMIC Elevated BP today die to emotional distress over mother's health. Reports compliance with losartan 100mg  BP Readings from Last 3 Encounters:  10/26/22 (!) 142/96  04/15/22 136/84  01/19/22 (!) 140/78    Maintain current med dose Repeat CMP F/up in 13month  Hyperlipidemia associated with type 2 diabetes mellitus (HCC) No adverse effects with crestor. 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 07/ 2017   History of pregnancy induced hypertension    Hyperlipidemia    Hypertension    during pregnancy-no medications   Iron deficiency anemia    Menorrhagia    Nephrolithiasis    bilateral tiny nonobstructive calculi per CT 06-21-2016   Uterine fibroid    Wears glasses    Past Surgical History:  Procedure Laterality Date   ABDOMINAL HYSTERECTOMY Bilateral 2018   BREAST EXCISIONAL BIOPSY Left 1982   benign  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   CESAREAN SECTION  02/04/1999    vbac 1-06 and 05-19-2005   ERCP W/ SPHINCTEROTOMY AND BALLOON DILATION  10/11/2005   LAPAROSCOPIC CHOLECYSTECTOMY  10/04/2005   w/ ERCP   LAPAROSCOPIC VAGINAL HYSTERECTOMY WITH SALPINGO OOPHORECTOMY Left 09/28/2016   Procedure: LAPAROSCOPIC ASSISTED VAGINAL HYSTERECTOMY WITH SALPINGO OOPHORECTOMY, right salpingectomy;  Surgeon: Marcelle Overlie, MD;  Location: [facility] [place];  Service: Gynecology;  Laterality: Left;  NEED BED FOR OVERNIGHT STAY   Social History   Socioeconomic History   Marital status:  Married    Spouse name: Not on file   Number of children: 3   Years of education: Not on file   Highest education level: Not on file  Occupational History   Occupation: math Magazine features editor: [employer]  Tobacco Use   Smoking status: Never   Smokeless tobacco: Never  Vaping Use   Vaping status: Never Used  Substance and Sexual Activity   Alcohol use: No    Alcohol/week: 0.0 standard drinks of alcohol   Drug use: No   Sexual activity: Yes    Partners: Male    Birth control/protection: Surgical    Comment:  husband - vasectomy  Other Topics Concern   Not on file  Social History Narrative   Married to [place], had son [name] 11-00, [name], and skylar   Social Determinants of Health   Financial Resource Strain: Not on file  Food Insecurity: Not on file  Transportation Needs: Not on file  Physical Activity: Not on file  Stress: Not on file  Social Connections: Not on file  Intimate Partner Violence: Not on file   Family Status  Relation Name Status   Mother  Alive   Father  Alive   Sister  Alive   Brother  Alive   MGM  Deceased   MGF  Deceased   PGM  Deceased   PGF  Deceased  No partnership data on file   Family History  Problem Relation Age of Onset   Cancer Mother 27       lymphoma   Diabetes Mother    Hypertension Mother    Cancer Maternal Grandmother 60       Lung   Breast cancer Maternal Grandmother 61   Aneurysm Maternal Grandmother        Brain   No Known Allergies  Patient Care Team: Taliyah Watrous, Bonna Gains, NP as PCP - General (Internal Medicine) Corky Crafts, MD as PCP - Cardiology ([facility], [facility]   Medications: Outpatient Medications Prior to Visit  Medication Sig   blood glucose meter kit and supplies Dispense based on patient and insurance preference. Check glucose Daily in AM. ICD10: E11.65   metFORMIN (GLUCOPHAGE-XR) 750 MG 24 hr tablet TAKE 1 TAB WITH BREAKFAST    rosuvastatin (CRESTOR) 20 MG tablet Take 1 tablet (20 mg total) by mouth daily.   [DISCONTINUED] glipiZIDE (GLUCOTROL XL) 10 MG 24 hr tablet Take 1 tablet (10 mg total) by mouth daily with breakfast.   [DISCONTINUED] losartan (COZAAR) 100 MG tablet Take 1 tablet (100 mg total) by mouth daily.   [DISCONTINUED] glipiZIDE (GLUCOTROL XL) 5 MG 24 hr tablet TAKE 1 TAB WITH BREAKFAST   No facility-administered medications prior to visit.    Review of Systems  Constitutional:  Negative for activity change, appetite change, fever and unexpected weight change.  Respiratory: Negative.    Cardiovascular: Negative.  Gastrointestinal: Negative.   Endocrine: Negative for cold intolerance and heat intolerance.  Genitourinary: Negative.   Musculoskeletal: Negative.   Skin:  Positive for rash.  Neurological: Negative.   Hematological: Negative.   Psychiatric/Behavioral:  Negative for behavioral problems, decreased concentration, dysphoric mood, hallucinations, self-injury, sleep disturbance and suicidal ideas. The patient is not nervous/anxious.        Objective:  BP (!) 142/96   Pulse 83   Temp (!) 97.3 F (36.3 C)   Ht 5\' 9"  (1.753 m)   Wt 235 lb 6.4 oz (106.8 kg)   LMP 09/16/2016 (Approximate)   SpO2 96%   BMI 34.76 kg/m     Physical Exam Vitals and nursing note reviewed.  Constitutional:      General: She is not in acute distress. HENT:     Right Ear: Tympanic membrane, ear canal and external ear normal.     Left Ear: Tympanic membrane, ear canal and external ear normal.     Nose: Nose normal.  Eyes:     Extraocular Movements: Extraocular movements intact.     Conjunctiva/sclera: Conjunctivae normal.     Pupils: Pupils are equal, round, and reactive to light.  Neck:     Thyroid: No thyroid mass, thyromegaly or thyroid tenderness.  Cardiovascular:     Rate and Rhythm: Normal rate and regular rhythm.     Pulses: Normal pulses.     Heart sounds: Normal heart sounds.  Pulmonary:      Effort: Pulmonary effort is normal.     Breath sounds: Normal breath sounds.  Abdominal:     General: Bowel sounds are normal.     Palpations: Abdomen is soft.  Musculoskeletal:        General: Normal range of motion.     Cervical back: Normal range of motion and neck supple.     Right lower leg: No edema.     Left lower leg: No edema.  Lymphadenopathy:     Cervical: No cervical adenopathy.  Skin:    General: Skin is warm and dry.  Neurological:     Mental Status: She is alert and oriented to person, place, and time.     Cranial Nerves: No cranial nerve deficit.  Psychiatric:        Mood and Affect: Mood normal.        Behavior: Behavior normal.        Thought Content: Thought content normal.     Results for orders placed or performed in visit on 10/26/22  Hemoglobin A1c  Result Value Ref Range   Hgb A1c MFr Bld 8.6 (H) 4.6 - 6.5 %  Microalbumin / creatinine urine ratio  Result Value Ref Range   Microalb, Ur 2.1 (H) 0.0 - 1.9 mg/dL   Creatinine,U 409.8 mg/dL   Microalb Creat Ratio 0.9 0.0 - 30.0 mg/g  Comprehensive metabolic panel  Result Value Ref Range   Sodium 139 135 - 145 mEq/L   Potassium 4.1 3.5 - 5.1 mEq/L   Chloride 101 96 - 112 mEq/L   CO2 29 19 - 32 mEq/L   Glucose, Bld 169 (H) 70 - 99 mg/dL   BUN 14 6 - 23 mg/dL   Creatinine, Ser 1.19 0.40 - 1.20 mg/dL   Total Bilirubin 0.4 0.2 - 1.2 mg/dL   Alkaline Phosphatase 81 39 - 117 U/L   AST 17 0 - 37 U/L   ALT 27 0 - 35 U/L   Total Protein 7.5 6.0 - 8.3 g/dL  Albumin 4.5 3.5 - 5.2 g/dL   GFR 08.65 >78.46 mL/min   Calcium 9.6 8.4 - 10.5 mg/dL  Lipid panel  Result Value Ref Range   Cholesterol 185 0 - 200 mg/dL   Triglycerides 96.2 0.0 - 149.0 mg/dL   HDL 95.28 >41.32 mg/dL   VLDL 44.0 0.0 - 10.2 mg/dL   LDL Cholesterol 725 (H) 0 - 99 mg/dL   Total CHOL/HDL Ratio 3    NonHDL 118.58       Assessment & Plan:    Routine Health Maintenance and Physical Exam  Immunization History  Administered  Date(s) Administered   Influenza,inj,Quad PF,6+ Mos 01/08/2013   PFIZER(Purple Top)SARS-COV-2 Vaccination 08/10/2019, 08/31/2019   Td 05/12/2005   Tdap 04/14/2017   Health Maintenance  Topic Date Due   COVID-19 Vaccine (3 - 2023-24 season) 12/04/2021   Zoster Vaccines- Shingrix (1 of 2) Never done   INFLUENZA VACCINE  11/04/2022   FOOT EXAM  11/17/2022   HEMOGLOBIN A1C  04/28/2023   OPHTHALMOLOGY EXAM  10/25/2023   Diabetic kidney evaluation - eGFR measurement  10/26/2023   Diabetic kidney evaluation - Urine ACR  10/26/2023   MAMMOGRAM  03/03/2024   DTaP/Tdap/Td (3 - Td or Tdap) 04/15/2027   Colonoscopy  04/22/2031   Hepatitis C Screening  Completed   HIV Screening  Completed   HPV VACCINES  Aged Out   PAP SMEAR-Modifier  Discontinued   Discussed health benefits of physical activity, and encouraged her to engage in regular exercise appropriate for her age and condition.  Problem List Items Addressed This Visit       Cardiovascular and Mediastinum   HYPERTENSION, BENIGN SYSTEMIC    Elevated BP today die to emotional distress over mother's health. Reports compliance with losartan 100mg  BP Readings from Last 3 Encounters:  10/26/22 (!) 142/96  04/15/22 136/84  01/19/22 (!) 140/78    Maintain current med dose Repeat CMP F/up in 89month        Endocrine   DM (diabetes mellitus) (HCC)    Uncontrolled DIABETES:  Reports compliance with metformin and glipizide Negative retinopathy and neuropathy BP not at goal Current use of crestor Repeat UACr, hgbA1c, lipid panel anc CMP hgbA1c remains at 8.6% Add rybelsus 3mg  daily. LDL at goal Normal CMP F/up in 3months      Relevant Medications   Semaglutide (RYBELSUS) 3 MG TABS   Other Relevant Orders   Hemoglobin A1c (Completed)   Microalbumin / creatinine urine ratio (Completed)   Hyperlipidemia associated with type 2 diabetes mellitus (HCC)    No adverse effects with crestor. Repeat lipid panel      Relevant  Medications   Semaglutide (RYBELSUS) 3 MG TABS   Other Relevant Orders   Lipid panel (Completed)     Other   Adjustment disorder with mixed anxiety and depressed mood    Chronic, waxing and waning, ongoing CBT sessions with psychologist. Denies need for medication at this time      Other Visit Diagnoses     Encounter for preventative adult health care exam with abnormal findings    -  Primary   Relevant Orders   Comprehensive metabolic panel (Completed)   Folliculitis       Relevant Medications   mupirocin ointment (BACTROBAN) 2 %      Return in about 4 weeks (around 11/23/2022) for HTN, depression and anxiety.     Alysia Penna, NP

## 2022-10-26 NOTE — Assessment & Plan Note (Signed)
No adverse effects with crestor ?Repeat lipid panel ?

## 2022-10-26 NOTE — Assessment & Plan Note (Signed)
Elevated BP today die to emotional distress over mother's health. Reports compliance with losartan 100mg  BP Readings from Last 3 Encounters:  10/26/22 (!) 142/96  04/15/22 136/84  01/19/22 (!) 140/78    Maintain current med dose Repeat CMP F/up in 30month

## 2022-10-26 NOTE — Assessment & Plan Note (Signed)
Chronic, waxing and waning, ongoing CBT sessions with psychologist. Denies need for medication at this time

## 2022-10-26 NOTE — Patient Instructions (Signed)
Go to lab Continue Heart healthy diet and daily exercise. Maintain current medications. Schedule nurse visit for shringrix vaccine  Preventive Care 53-50 Years Old, Female Preventive care refers to lifestyle choices and visits with your health care provider that can promote health and wellness. Preventive care visits are also called wellness exams. What can I expect for my preventive care visit? Counseling Your health care provider may ask you questions about your: Medical history, including: Past medical problems. Family medical history. Pregnancy history. Current health, including: Menstrual cycle. Method of birth control. Emotional well-being. Home life and relationship well-being. Sexual activity and sexual health. Lifestyle, including: Alcohol, nicotine or tobacco, and drug use. Access to firearms. Diet, exercise, and sleep habits. Work and work Astronomer. Sunscreen use. Safety issues such as seatbelt and bike helmet use. Physical exam Your health care provider will check your: Height and weight. These may be used to calculate your BMI (body mass index). BMI is a measurement that tells if you are at a healthy weight. Waist circumference. This measures the distance around your waistline. This measurement also tells if you are at a healthy weight and may help predict your risk of certain diseases, such as type 2 diabetes and high blood pressure. Heart rate and blood pressure. Body temperature. Skin for abnormal spots. What immunizations do I need?  Vaccines are usually given at various ages, according to a schedule. Your health care provider will recommend vaccines for you based on your age, medical history, and lifestyle or other factors, such as travel or where you work. What tests do I need? Screening Your health care provider may recommend screening tests for certain conditions. This may include: Lipid and cholesterol levels. Diabetes screening. This is done by  checking your blood sugar (glucose) after you have not eaten for a while (fasting). Pelvic exam and Pap test. Hepatitis B test. Hepatitis C test. HIV (human immunodeficiency virus) test. STI (sexually transmitted infection) testing, if you are at risk. Lung cancer screening. Colorectal cancer screening. Mammogram. Talk with your health care provider about when you should start having regular mammograms. This may depend on whether you have a family history of breast cancer. BRCA-related cancer screening. This may be done if you have a family history of breast, ovarian, tubal, or peritoneal cancers. Bone density scan. This is done to screen for osteoporosis. Talk with your health care provider about your test results, treatment options, and if necessary, the need for more tests. Follow these instructions at home: Eating and drinking  Eat a diet that includes fresh fruits and vegetables, whole grains, lean protein, and low-fat dairy products. Take vitamin and mineral supplements as recommended by your health care provider. Do not drink alcohol if: Your health care provider tells you not to drink. You are pregnant, may be pregnant, or are planning to become pregnant. If you drink alcohol: Limit how much you have to 0-1 drink a day. Know how much alcohol is in your drink. In the [place] one drink equals one 12 oz bottle of beer (355 mL), one 5 oz glass of wine (148 mL), or one 1 oz glass of hard liquor (44 mL). Lifestyle Brush your teeth every morning and night with fluoride toothpaste. Floss one time each day. Exercise for at least 30 minutes 5 or more days each week. Do not use any products that contain nicotine or tobacco. These products include cigarettes, chewing tobacco, and vaping devices, such as e-cigarettes. If you need help quitting, ask your health care provider. Do  not use drugs. If you are sexually active, practice safe sex. Use a condom or other form of protection to prevent  STIs. If you do not wish to become pregnant, use a form of birth control. If you plan to become pregnant, see your health care provider for a prepregnancy visit. Take aspirin only as told by your health care provider. Make sure that you understand how much to take and what form to take. Work with your health care provider to find out whether it is safe and beneficial for you to take aspirin daily. Find healthy ways to manage stress, such as: Meditation, yoga, or listening to music. Journaling. Talking to a trusted person. Spending time with friends and family. Minimize exposure to UV radiation to reduce your risk of skin cancer. Safety Always wear your seat belt while driving or riding in a vehicle. Do not drive: If you have been drinking alcohol. Do not ride with someone who has been drinking. When you are tired or distracted. While texting. If you have been using any mind-altering substances or drugs. Wear a helmet and other protective equipment during sports activities. If you have firearms in your house, make sure you follow all gun safety procedures. Seek help if you have been physically or sexually abused. What's next? Visit your health care provider once a year for an annual wellness visit. Ask your health care provider how often you should have your eyes and teeth checked. Stay up to date on all vaccines. This information is not intended to replace advice given to you by your health care provider. Make sure you discuss any questions you have with your health care provider. Document Revised: 09/17/2020 Document Reviewed: 09/17/2020 Elsevier Patient Education  2024 [employer].

## 2022-10-26 NOTE — Assessment & Plan Note (Signed)
Benign pathology

## 2022-10-26 NOTE — Assessment & Plan Note (Signed)
Uncontrolled Reports compliance with metformin and glipizide Negative retinopathy and neuropathy BP not at goal Current use of crestor Repeat UACr, hgbA1c, lipid panel anc CMP

## 2022-10-27 ENCOUNTER — Other Ambulatory Visit: Payer: Self-pay | Admitting: Nurse Practitioner

## 2022-10-27 DIAGNOSIS — E1165 Type 2 diabetes mellitus with hyperglycemia: Secondary | ICD-10-CM

## 2022-10-27 NOTE — Telephone Encounter (Signed)
Chart supports rx. Last OV: 10/26/2022

## 2022-10-28 ENCOUNTER — Other Ambulatory Visit: Payer: Self-pay | Admitting: Nurse Practitioner

## 2022-10-28 DIAGNOSIS — I10 Essential (primary) hypertension: Secondary | ICD-10-CM

## 2022-10-28 MED ORDER — RYBELSUS 3 MG PO TABS
3.0000 mg | ORAL_TABLET | Freq: Every day | ORAL | 1 refills | Status: AC
Start: 2022-10-28 — End: ?

## 2022-10-28 NOTE — Addendum Note (Signed)
Addended by: Michaela Corner on: 10/28/2022 03:31 PM   Modules accepted: Orders

## 2022-11-27 ENCOUNTER — Other Ambulatory Visit: Payer: Self-pay | Admitting: Nurse Practitioner

## 2022-11-27 DIAGNOSIS — E1165 Type 2 diabetes mellitus with hyperglycemia: Secondary | ICD-10-CM

## 2022-11-29 ENCOUNTER — Other Ambulatory Visit: Payer: Self-pay

## 2022-11-29 DIAGNOSIS — E1165 Type 2 diabetes mellitus with hyperglycemia: Secondary | ICD-10-CM

## 2022-11-29 MED ORDER — GLIPIZIDE ER 10 MG PO TB24
10.0000 mg | ORAL_TABLET | Freq: Every day | ORAL | 3 refills | Status: DC
Start: 2022-11-29 — End: 2023-08-17

## 2022-11-29 MED ORDER — METFORMIN HCL ER 750 MG PO TB24
750.0000 mg | ORAL_TABLET | Freq: Every day | ORAL | 1 refills | Status: AC
Start: 2022-11-29 — End: ?

## 2022-12-27 ENCOUNTER — Telehealth: Payer: Self-pay | Admitting: Nurse Practitioner

## 2022-12-27 ENCOUNTER — Ambulatory Visit: Payer: Commercial Managed Care - PPO | Admitting: Nurse Practitioner

## 2022-12-27 NOTE — Telephone Encounter (Signed)
 1st no show, letter sent via mail

## 2022-12-27 NOTE — Telephone Encounter (Signed)
9.23.24 no show/ no show letter sent

## 2022-12-29 ENCOUNTER — Encounter: Payer: Self-pay | Admitting: Nurse Practitioner

## 2023-01-04 ENCOUNTER — Ambulatory Visit (INDEPENDENT_AMBULATORY_CARE_PROVIDER_SITE_OTHER): Payer: Commercial Managed Care - PPO | Admitting: Nurse Practitioner

## 2023-01-04 ENCOUNTER — Encounter: Payer: Self-pay | Admitting: Nurse Practitioner

## 2023-01-04 VITALS — BP 138/80 | HR 82 | Temp 97.8°F | Ht 69.0 in | Wt 239.0 lb

## 2023-01-04 DIAGNOSIS — I10 Essential (primary) hypertension: Secondary | ICD-10-CM

## 2023-01-04 DIAGNOSIS — E1165 Type 2 diabetes mellitus with hyperglycemia: Secondary | ICD-10-CM | POA: Diagnosis not present

## 2023-01-04 DIAGNOSIS — M79671 Pain in right foot: Secondary | ICD-10-CM | POA: Insufficient documentation

## 2023-01-04 DIAGNOSIS — E1169 Type 2 diabetes mellitus with other specified complication: Secondary | ICD-10-CM

## 2023-01-04 DIAGNOSIS — E785 Hyperlipidemia, unspecified: Secondary | ICD-10-CM

## 2023-01-04 DIAGNOSIS — Z23 Encounter for immunization: Secondary | ICD-10-CM | POA: Diagnosis not present

## 2023-01-04 DIAGNOSIS — Z7984 Long term (current) use of oral hypoglycemic drugs: Secondary | ICD-10-CM

## 2023-01-04 MED ORDER — RYBELSUS 3 MG PO TABS
3.0000 mg | ORAL_TABLET | Freq: Every day | ORAL | 1 refills | Status: DC
Start: 2023-01-04 — End: 2023-04-18

## 2023-01-04 MED ORDER — ROSUVASTATIN CALCIUM 20 MG PO TABS
20.0000 mg | ORAL_TABLET | Freq: Every day | ORAL | 3 refills | Status: DC
Start: 2023-01-04 — End: 2023-01-20

## 2023-01-04 NOTE — Patient Instructions (Addendum)
Ok to use voltaren gel 2-3x/day on both knees Maintain current med doses

## 2023-01-04 NOTE — Assessment & Plan Note (Addendum)
Uncontrolled Current use of metformin and glipizide Last hgbA1c at 8.6% Did not start rybelsus as prescribed due to med shortage per pharmacist. Normal foot exam and UACr Has completed DIABETES eye exam-report requested  Transferred rybelsus to retail pharmacy with med. Patient notified. Maintain other med dose Repeat labs in 3months

## 2023-01-04 NOTE — Assessment & Plan Note (Signed)
Onset 2weeks ago, lateral foot pain. denies any injury. Not releated to any particular footwear Intermittent pain, no swelling, no redness, no deformity.  Entered referral to podiatry

## 2023-01-04 NOTE — Assessment & Plan Note (Signed)
Improved BP control BP Readings from Last 3 Encounters:  01/04/23 138/80  10/26/22 (!) 142/96  04/15/22 136/84    Maintain current med dose F/up in 3months

## 2023-01-04 NOTE — Progress Notes (Signed)
Established Patient Visit  Patient: Barbara [name]   DOB: Mar 30, 1971   52 y.o. Female  MRN: [id number] Visit Date: 01/04/2023  Subjective:    Chief Complaint  Patient presents with   Follow-up    4 week follow up. She has an area on her right foot that she is concerned about along with knee pain. She would like the shingles vaccine. Declined flu. Will request eye exam.   HPI DM (diabetes mellitus) (HCC) Uncontrolled Current use of metformin and glipizide Last hgbA1c at 8.6% Did not start rybelsus as prescribed due to med shortage per pharmacist. Normal foot exam and UACr  Transferred rybelsus to retail pharmacy with med. Patient notified. Maintain other med dose Repeat labs in 3months    HYPERTENSION, BENIGN SYSTEMIC Improved BP control BP Readings from Last 3 Encounters:  01/04/23 138/80  10/26/22 (!) 142/96  04/15/22 136/84    Maintain current med dose F/up in 3months  Wt Readings from Last 3 Encounters:  01/04/23 239 lb (108.4 kg)  10/26/22 235 lb 6.4 oz (106.8 kg)  04/15/22 235 lb 3.2 oz (106.7 kg)    Reviewed medical, surgical, and social history today  Medications: Outpatient Medications Prior to Visit  Medication Sig   blood glucose meter kit and supplies Dispense based on patient and insurance preference. Check glucose Daily in AM. ICD10: E11.65   glipiZIDE (GLUCOTROL XL) 10 MG 24 hr tablet Take 1 tablet (10 mg total) by mouth daily with breakfast.   losartan (COZAAR) 100 MG tablet TAKE 1 TABLET BY MOUTH EVERY DAY   metFORMIN (GLUCOPHAGE-XR) 750 MG 24 hr tablet TAKE 1 TAB WITH BREAKFAST   mupirocin ointment (BACTROBAN) 2 % Apply 1 Application topically 2 (two) times daily.   rosuvastatin (CRESTOR) 20 MG tablet Take 1 tablet (20 mg total) by mouth daily.   [DISCONTINUED] Semaglutide (RYBELSUS) 3 MG TABS Take 1 tablet (3 mg total) by mouth daily after breakfast.   [DISCONTINUED] glipiZIDE (GLUCOTROL XL) 5 MG 24 hr tablet TAKE 1 TAB WITH  BREAKFAST (Patient not taking: Reported on 01/04/2023)   [DISCONTINUED] metFORMIN (GLUCOPHAGE-XR) 750 MG 24 hr tablet Take 1 tablet (750 mg total) by mouth daily with breakfast. (Patient not taking: Reported on 01/04/2023)   No facility-administered medications prior to visit.   Reviewed past medical and social history.   ROS per HPI above      Objective:  BP 138/80   Pulse 82   Temp 97.8 F (36.6 C) (Temporal)   Ht 5\' 9"  (1.753 m)   Wt 239 lb (108.4 kg)   LMP 09/16/2016 (Approximate)   SpO2 96%   BMI 35.29 kg/m      Physical Exam Cardiovascular:     Rate and Rhythm: Normal rate and regular rhythm.     Pulses: Normal pulses.          Dorsalis pedis pulses are 2+ on the right side and 2+ on the left side.       Posterior tibial pulses are 2+ on the right side and 2+ on the left side.     Heart sounds: Normal heart sounds.  Pulmonary:     Effort: Pulmonary effort is normal.     Breath sounds: Normal breath sounds.  Musculoskeletal:     Right foot: Normal range of motion. No deformity, bunion or prominent metatarsal heads.     Left foot: Normal range of motion. No deformity, bunion or  prominent metatarsal heads.  Feet:     Right foot:     Protective Sensation: 8 sites tested.  8 sites sensed.     Skin integrity: Skin integrity normal.     Toenail Condition: Right toenails are normal.     Left foot:     Protective Sensation: 8 sites tested.  8 sites sensed.     Skin integrity: Skin integrity normal.     Toenail Condition: Left toenails are normal.  Neurological:     Mental Status: She is alert and oriented to person, place, and time.     No results found for any visits on 01/04/23.    Assessment & Plan:    Problem List Items Addressed This Visit     DM (diabetes mellitus) (HCC)    Uncontrolled Current use of metformin and glipizide Last hgbA1c at 8.6% Did not start rybelsus as prescribed due to med shortage per pharmacist. Normal foot exam and  UACr  Transferred rybelsus to retail pharmacy with med. Patient notified. Maintain other med dose Repeat labs in 3months        Relevant Medications   Semaglutide (RYBELSUS) 3 MG TABS   HYPERTENSION, BENIGN SYSTEMIC - Primary    Improved BP control BP Readings from Last 3 Encounters:  01/04/23 138/80  10/26/22 (!) 142/96  04/15/22 136/84    Maintain current med dose F/up in 3months      Other Visit Diagnoses     Immunization due       Relevant Orders   Zoster Recombinant (Shingrix ) (Completed)   Right foot pain       Relevant Orders   Ambulatory referral to Podiatry      Return in about 3 months (around 04/06/2023) for HTN, DM, hyperlipidemia (fasting).     Alysia Penna, NP

## 2023-01-20 ENCOUNTER — Other Ambulatory Visit: Payer: Self-pay | Admitting: Nurse Practitioner

## 2023-01-20 DIAGNOSIS — E1169 Type 2 diabetes mellitus with other specified complication: Secondary | ICD-10-CM

## 2023-01-20 DIAGNOSIS — L739 Follicular disorder, unspecified: Secondary | ICD-10-CM

## 2023-03-07 ENCOUNTER — Other Ambulatory Visit: Payer: Self-pay | Admitting: Nurse Practitioner

## 2023-03-07 DIAGNOSIS — Z1231 Encounter for screening mammogram for malignant neoplasm of breast: Secondary | ICD-10-CM

## 2023-03-10 ENCOUNTER — Ambulatory Visit
Admission: RE | Admit: 2023-03-10 | Discharge: 2023-03-10 | Disposition: A | Discharge status: Home / Self Care | Payer: Commercial Managed Care - PPO | Source: Ambulatory Visit | Attending: Nurse Practitioner | Admitting: Nurse Practitioner

## 2023-03-10 DIAGNOSIS — Z1231 Encounter for screening mammogram for malignant neoplasm of breast: Secondary | ICD-10-CM

## 2023-04-18 ENCOUNTER — Ambulatory Visit (INDEPENDENT_AMBULATORY_CARE_PROVIDER_SITE_OTHER): Payer: Commercial Managed Care - PPO | Admitting: Nurse Practitioner

## 2023-04-18 ENCOUNTER — Encounter: Payer: Self-pay | Admitting: Nurse Practitioner

## 2023-04-18 VITALS — BP 140/98 | HR 73 | Temp 98.0°F | Resp 18 | Ht 69.0 in | Wt 236.0 lb

## 2023-04-18 DIAGNOSIS — E1165 Type 2 diabetes mellitus with hyperglycemia: Secondary | ICD-10-CM

## 2023-04-18 DIAGNOSIS — E785 Hyperlipidemia, unspecified: Secondary | ICD-10-CM

## 2023-04-18 DIAGNOSIS — I10 Essential (primary) hypertension: Secondary | ICD-10-CM | POA: Diagnosis not present

## 2023-04-18 DIAGNOSIS — Z7984 Long term (current) use of oral hypoglycemic drugs: Secondary | ICD-10-CM

## 2023-04-18 DIAGNOSIS — E1169 Type 2 diabetes mellitus with other specified complication: Secondary | ICD-10-CM

## 2023-04-18 LAB — HEMOGLOBIN A1C: Hgb A1c MFr Bld: 11.1 % — ABNORMAL HIGH (ref 4.6–6.5)

## 2023-04-18 LAB — RENAL FUNCTION PANEL
Albumin: 4.4 g/dL (ref 3.5–5.2)
BUN: 12 mg/dL (ref 6–23)
CO2: 28 meq/L (ref 19–32)
Calcium: 9.4 mg/dL (ref 8.4–10.5)
Chloride: 101 meq/L (ref 96–112)
Creatinine, Ser: 0.84 mg/dL (ref 0.40–1.20)
GFR: 80.85 mL/min (ref >60.00)
Glucose, Bld: 227 mg/dL — ABNORMAL HIGH (ref 70–99)
Phosphorus: 3.4 mg/dL (ref 2.3–4.6)
Potassium: 4 meq/L (ref 3.5–5.1)
Sodium: 138 meq/L (ref 135–145)

## 2023-04-18 LAB — LDL CHOLESTEROL, DIRECT: Direct LDL: 113 mg/dL

## 2023-04-18 LAB — MICROALBUMIN / CREATININE URINE RATIO
Creatinine,U: 218.5 mg/dL
Microalb Creat Ratio: 1.1 mg/g (ref 0.0–30.0)
Microalb, Ur: 2.4 mg/dL — ABNORMAL HIGH (ref 0.0–1.9)

## 2023-04-18 MED ORDER — RYBELSUS 3 MG PO TABS
3.0000 mg | ORAL_TABLET | Freq: Every day | ORAL | 1 refills | Status: DC
Start: 2023-04-18 — End: 2023-07-15

## 2023-04-18 NOTE — Patient Instructions (Addendum)
 Schedule nurse visit for Prevnar 20 and 2nd Shingrix  vaccine on Friday. Change footwear Use naproxen  500mg  daily with food. Schedule appointment with podiatry if no improvement. Maintain a low salt diet Go to lab  Plantar Fasciitis  Plantar fasciitis is a painful foot condition that affects the heel. It occurs when the band of tissue that connects the toes to the heel bone (plantar fascia) becomes irritated. This can happen as the result of exercising too much or doing other repetitive activities (overuse injury). Plantar fasciitis can cause mild irritation to severe pain that makes it difficult to walk or move. The pain is usually worse in the morning after sleeping, or after sitting or lying down for a period of time. Pain may also be worse after long periods of walking or standing. What are the causes? This condition may be caused by: Standing for long periods of time. Wearing shoes that do not have good arch support. Doing activities that put stress on joints (high-impact activities). This includes ballet and exercise that makes your heart beat faster (aerobic exercise), such as running. Being overweight. An abnormal way of walking (gait). Tight muscles in the back of your lower leg (calf). High arches in your feet or flat feet. Starting a new athletic activity. What are the signs or symptoms? The main symptom of this condition is heel pain. Pain may get worse after the following: Taking the first steps after a time of rest, especially in the morning after awakening, or after you have been sitting or lying down for a while. Long periods of standing still. Pain may decrease after 30-45 minutes of activity, such as gentle walking. How is this diagnosed? This condition may be diagnosed based on your medical history, a physical exam, and your symptoms. Your health care provider will check for: A tender area on the bottom of your foot. A high arch in your foot or flat feet. Pain when you  move your foot. Difficulty moving your foot. You may have imaging tests to confirm the diagnosis, such as: X-rays. Ultrasound. MRI. How is this treated? Treatment for plantar fasciitis depends on how severe your condition is. Treatment may include: Rest, ice, pressure (compression), and raising (elevating) the affected foot. This is called RICE therapy. Your health care provider may recommend RICE therapy along with over-the-counter pain medicines to manage your pain. Exercises to stretch your calves and your plantar fascia. A splint that holds your foot in a stretched, upward position while you sleep (night splint). Physical therapy to relieve symptoms and prevent problems in the future. Injections of steroid medicine (cortisone) to relieve pain and inflammation. Stimulating your plantar fascia with electrical impulses (extracorporeal shock wave therapy). This is usually the last treatment option before surgery. Surgery, if other treatments have not worked after 12 months. Follow these instructions at home: Managing pain, stiffness, and swelling  If directed, put ice on the painful area. To do this: Put ice in a plastic bag, or use a frozen bottle of water. Place a towel between your skin and the bag or bottle. Roll the bottom of your foot over the bag or bottle. Do this for 20 minutes, 2-3 times a day. Wear athletic shoes that have air-sole or gel-sole cushions, or try soft shoe inserts that are designed for plantar fasciitis. Elevate your foot above the level of your heart while you are sitting or lying down. Activity Avoid activities that cause pain. Ask your health care provider what activities are safe for you. Do physical  therapy exercises and stretches as told by your health care provider. Try activities and forms of exercise that are easier on your joints (low impact). Examples include swimming, water aerobics, and biking. General instructions Take over-the-counter and  prescription medicines only as told by your health care provider. Wear a night splint while sleeping, if told by your health care provider. Loosen the splint if your toes tingle, become numb, or turn cold and blue. Maintain a healthy weight, or work with your health care provider to lose weight as needed. Keep all follow-up visits. This is important. Contact a health care provider if you have: Symptoms that do not go away with home treatment. Pain that gets worse. Pain that affects your ability to move or do daily activities. Summary Plantar fasciitis is a painful foot condition that affects the heel. It occurs when the band of tissue that connects the toes to the heel bone (plantar fascia) becomes irritated. Heel pain is the main symptom of this condition. It may get worse after exercising too much or standing still for a long time. Treatment varies, but it usually starts with rest, ice, pressure (compression), and raising (elevating) the affected foot. This is called RICE therapy. Over-the-counter medicines can also be used to manage pain. This information is not intended to replace advice given to you by your health care provider. Make sure you discuss any questions you have with your health care provider. Document Revised: 07/09/2019 Document Reviewed: 07/09/2019 Elsevier Patient Education  2024 [employer].

## 2023-04-18 NOTE — Assessment & Plan Note (Signed)
No adverse effects with crestor ?Repeat lipid panel ?

## 2023-04-18 NOTE — Progress Notes (Signed)
 Established Patient Visit  Patient: Barbara [name]   DOB: June 01, 1970   52 y.o. Female  MRN: [id number] Visit Date: 04/18/2023  Subjective:    Chief Complaint  Patient presents with   Medical Managment for Chronic Issues     6 month follow up hypertension, DM, hyperlipidemia; eye exam schedule for January 20th; vaccinations are due    HPI HYPERTENSION, BENIGN SYSTEMIC BP not at goal Report compliance with me dose, but non compliance with diet. No exercise regimen. BP Readings from Last 3 Encounters:  04/18/23 (!) 140/98  01/04/23 138/80  10/26/22 (!) 142/96    Maintain med dose Advised about importance of DASH Repeat BMP F/up in 32month  DM (diabetes mellitus) (HCC) Did not start rybelsus  as prescribed. Has been taking metformin  and glipizide  as prescribed Normal foot exam UTD with eye exam  Repeat hgbA1c, UACr, and BMP today Advised to start rybelsus  as prescribed F/up in 32month  Hyperlipidemia associated with type 2 diabetes mellitus (HCC) No adverse effects with crestor . Repeat lipid panel  Reviewed medical, surgical, and social history today  Medications: Outpatient Medications Prior to Visit  Medication Sig   blood glucose meter kit and supplies Dispense based on patient and insurance preference. Check glucose Daily in AM. ICD10: E11.65   glipiZIDE  (GLUCOTROL  XL) 10 MG 24 hr tablet Take 1 tablet (10 mg total) by mouth daily with breakfast.   losartan  (COZAAR ) 100 MG tablet TAKE 1 TABLET BY MOUTH EVERY DAY   metFORMIN  (GLUCOPHAGE -XR) 750 MG 24 hr tablet TAKE 1 TAB WITH BREAKFAST   rosuvastatin  (CRESTOR ) 20 MG tablet TAKE 1 TABLET BY MOUTH EVERY DAY   [DISCONTINUED] Semaglutide  (RYBELSUS ) 3 MG TABS Take 1 tablet (3 mg total) by mouth daily after breakfast.   mupirocin  ointment (BACTROBAN ) 2 % APPLY TO AFFECTED AREA TWICE A DAY (Patient not taking: Reported on 04/18/2023)   No facility-administered medications prior to visit.   Reviewed past  medical and social history.   ROS per HPI above      Objective:  BP (!) 140/98 (BP Location: Left Arm, Patient Position: Sitting, Cuff Size: Normal)   Pulse 73   Temp 98 F (36.7 C) (Temporal)   Resp 18   Ht 5' 9 (1.753 m)   Wt 236 lb (107 kg)   LMP 09/16/2016 (Approximate)   SpO2 99%   BMI 34.85 kg/m      Physical Exam Vitals and nursing note reviewed.  Cardiovascular:     Rate and Rhythm: Normal rate and regular rhythm.     Pulses: Normal pulses.     Heart sounds: Normal heart sounds.  Pulmonary:     Effort: Pulmonary effort is normal.     Breath sounds: Normal breath sounds.  Musculoskeletal:     Right lower leg: No edema.     Left lower leg: No edema.  Neurological:     Mental Status: She is alert and oriented to person, place, and time.     No results found for any visits on 04/18/23.    Assessment & Plan:    Problem List Items Addressed This Visit     DM (diabetes mellitus) (HCC)   Did not start rybelsus  as prescribed. Has been taking metformin  and glipizide  as prescribed Normal foot exam UTD with eye exam  Repeat hgbA1c, UACr, and BMP today Advised to start rybelsus  as prescribed F/up in 32month      Relevant  Medications   Semaglutide  (RYBELSUS ) 3 MG TABS   Other Relevant Orders   Hemoglobin A1c   Renal Function Panel   Microalbumin / creatinine urine ratio   Hyperlipidemia associated with type 2 diabetes mellitus (HCC)   No adverse effects with crestor . Repeat lipid panel      Relevant Medications   Semaglutide  (RYBELSUS ) 3 MG TABS   Other Relevant Orders   Direct LDL   HYPERTENSION, BENIGN SYSTEMIC - Primary   BP not at goal Report compliance with me dose, but non compliance with diet. No exercise regimen. BP Readings from Last 3 Encounters:  04/18/23 (!) 140/98  01/04/23 138/80  10/26/22 (!) 142/96    Maintain med dose Advised about importance of DASH Repeat BMP F/up in 38month      Relevant Orders   Renal Function Panel    Return in about 4 weeks (around 05/16/2023).     Roselie Mood, NP

## 2023-04-18 NOTE — Assessment & Plan Note (Signed)
 BP not at goal Report compliance with me dose, but non compliance with diet. No exercise regimen. BP Readings from Last 3 Encounters:  04/18/23 (!) 140/98  01/04/23 138/80  10/26/22 (!) 142/96    Maintain med dose Advised about importance of DASH Repeat BMP F/up in 57month

## 2023-04-18 NOTE — Assessment & Plan Note (Signed)
 Did not start rybelsus as prescribed. Has been taking metformin and glipizide as prescribed Normal foot exam UTD with eye exam  Repeat hgbA1c, UACr, and BMP today Advised to start rybelsus as prescribed F/up in 36month

## 2023-04-19 ENCOUNTER — Encounter: Payer: Self-pay | Admitting: Nurse Practitioner

## 2023-04-19 ENCOUNTER — Other Ambulatory Visit: Payer: Self-pay | Admitting: Nurse Practitioner

## 2023-04-19 DIAGNOSIS — E1169 Type 2 diabetes mellitus with other specified complication: Secondary | ICD-10-CM

## 2023-04-19 MED ORDER — ROSUVASTATIN CALCIUM 40 MG PO TABS: 40.0000 mg | ORAL_TABLET | Freq: Every evening | ORAL | 1 refills | Status: DC

## 2023-04-21 ENCOUNTER — Ambulatory Visit: Payer: Commercial Managed Care - PPO

## 2023-05-16 ENCOUNTER — Ambulatory Visit: Payer: Commercial Managed Care - PPO | Admitting: Nurse Practitioner

## 2023-05-17 ENCOUNTER — Encounter: Payer: Self-pay | Admitting: Nurse Practitioner

## 2023-05-17 ENCOUNTER — Ambulatory Visit: Payer: Commercial Managed Care - PPO | Admitting: Nurse Practitioner

## 2023-05-17 VITALS — BP 142/90 | HR 77 | Temp 97.5°F | Resp 18 | Wt 236.4 lb

## 2023-05-17 DIAGNOSIS — Z23 Encounter for immunization: Secondary | ICD-10-CM

## 2023-05-17 DIAGNOSIS — Z7984 Long term (current) use of oral hypoglycemic drugs: Secondary | ICD-10-CM

## 2023-05-17 DIAGNOSIS — E785 Hyperlipidemia, unspecified: Secondary | ICD-10-CM

## 2023-05-17 DIAGNOSIS — E1169 Type 2 diabetes mellitus with other specified complication: Secondary | ICD-10-CM

## 2023-05-17 DIAGNOSIS — I10 Essential (primary) hypertension: Secondary | ICD-10-CM

## 2023-05-17 LAB — COMPREHENSIVE METABOLIC PANEL
ALT: 22 U/L (ref 0–35)
AST: 17 U/L (ref 0–37)
Albumin: 4.3 g/dL (ref 3.5–5.2)
Alkaline Phosphatase: 73 U/L (ref 39–117)
BUN: 13 mg/dL (ref 6–23)
CO2: 30 meq/L (ref 19–32)
Calcium: 9.1 mg/dL (ref 8.4–10.5)
Chloride: 103 meq/L (ref 96–112)
Creatinine, Ser: 0.8 mg/dL (ref 0.40–1.20)
GFR: 85.68 mL/min (ref >60.00)
Glucose, Bld: 153 mg/dL — ABNORMAL HIGH (ref 70–99)
Potassium: 4.1 meq/L (ref 3.5–5.1)
Sodium: 141 meq/L (ref 135–145)
Total Bilirubin: 0.3 mg/dL (ref 0.2–1.2)
Total Protein: 7.4 g/dL (ref 6.0–8.3)

## 2023-05-17 NOTE — Progress Notes (Signed)
Established Patient Visit  Patient: Barbara [name]   DOB: 07-24-1970   53 y.o. Female  MRN: [id number] Visit Date: 05/17/2023  Subjective:    Chief Complaint  Patient presents with   OFFICE VISIT     2 month follow up, 2nd shingles vaccine is due    HPI HYPERTENSION, BENIGN SYSTEMIC BP not at goal Reports compliance with losartan dose Has not implemented any diet modification nor exercise. BP Readings from Last 3 Encounters:  05/17/23 (!) 142/90  04/18/23 (!) 140/98  01/04/23 138/80    We discussed need to add amlodipine, but declined additional medication at this time. She want to make lifestyle modifications and return to office in 2months. Advised to monitor BP at home daily in AM, and send readings via mychart F/up in 2months  DM (diabetes mellitus) (HCC) No glucose check at home Denies any adverse effects with rybelsus, glipizide and metformin. Normal UACr  Repeat BMP If normal, increase rybelsus to 6mg  daily  Advised to take rybelsus and glipizide before breakfast. F/up in 2months  Hyperlipidemia associated with type 2 diabetes mellitus (HCC) No adverse effects with increased crestor dose Repeat hepatic panel   Wt Readings from Last 3 Encounters:  05/17/23 236 lb 6.4 oz (107.2 kg)  04/18/23 236 lb (107 kg)  01/04/23 239 lb (108.4 kg)     Reviewed medical, surgical, and social history today  Medications: Outpatient Medications Prior to Visit  Medication Sig   blood glucose meter kit and supplies Dispense based on patient and insurance preference. Check glucose Daily in AM. ICD10: E11.65   glipiZIDE (GLUCOTROL XL) 10 MG 24 hr tablet Take 1 tablet (10 mg total) by mouth daily with breakfast.   losartan (COZAAR) 100 MG tablet TAKE 1 TABLET BY MOUTH EVERY DAY   metFORMIN (GLUCOPHAGE-XR) 750 MG 24 hr tablet TAKE 1 TAB WITH BREAKFAST   mupirocin ointment (BACTROBAN) 2 % APPLY TO AFFECTED AREA TWICE A DAY   rosuvastatin (CRESTOR) 40 MG  tablet Take 1 tablet (40 mg total) by mouth every evening.   Semaglutide (RYBELSUS) 3 MG TABS Take 1 tablet (3 mg total) by mouth daily after breakfast.   No facility-administered medications prior to visit.   Reviewed past medical and social history.   ROS per HPI above      Objective:  BP (!) 142/90 (BP Location: Right Arm, Patient Position: Sitting, Cuff Size: Large) Comment: recheck done manual  Pulse 77   Temp (!) 97.5 F (36.4 C) (Temporal)   Resp 18   Wt 236 lb 6.4 oz (107.2 kg)   LMP 09/16/2016 (Approximate)   SpO2 98%   BMI 34.91 kg/m      Physical Exam Nursing note reviewed.  Cardiovascular:     Rate and Rhythm: Normal rate and regular rhythm.     Pulses: Normal pulses.     Heart sounds: Normal heart sounds.  Pulmonary:     Effort: Pulmonary effort is normal.     Breath sounds: Normal breath sounds.  Neurological:     Mental Status: She is alert and oriented to person, place, and time.     No results found for any visits on 05/17/23.    Assessment & Plan:    Problem List Items Addressed This Visit     DM (diabetes mellitus) (HCC)   No glucose check at home Denies any adverse effects with rybelsus, glipizide and metformin. Normal UACr  Repeat BMP If normal, increase rybelsus to 6mg  daily  Advised to take rybelsus and glipizide before breakfast. F/up in 2months      Relevant Orders   Comprehensive metabolic panel   Hyperlipidemia associated with type 2 diabetes mellitus (HCC)   No adverse effects with increased crestor dose Repeat hepatic panel      HYPERTENSION, BENIGN SYSTEMIC - Primary   BP not at goal Reports compliance with losartan dose Has not implemented any diet modification nor exercise. BP Readings from Last 3 Encounters:  05/17/23 (!) 142/90  04/18/23 (!) 140/98  01/04/23 138/80    We discussed need to add amlodipine, but declined additional medication at this time. She want to make lifestyle modifications and return to  office in 2months. Advised to monitor BP at home daily in AM, and send readings via mychart F/up in 2months      Relevant Orders   Comprehensive metabolic panel   Other Visit Diagnoses       Immunization due       Relevant Orders   Zoster Recombinant (Shingrix ) (Completed)      Return in about 2 months (around 07/15/2023) for HTN, DM, hyperlipidemia (fasting, complete PHQ9/GAD7).     Alysia Penna, NP

## 2023-05-17 NOTE — Patient Instructions (Addendum)
Go to lab Start daily exercise and heart healthy diet Monitor BP at home daliy in AM. Send BP readings via mychart in 2weeks. Take rybelsus and glipizide before breakfast.

## 2023-05-17 NOTE — Assessment & Plan Note (Signed)
BP not at goal Reports compliance with losartan dose Has not implemented any diet modification nor exercise. BP Readings from Last 3 Encounters:  05/17/23 (!) 142/90  04/18/23 (!) 140/98  01/04/23 138/80    We discussed need to add amlodipine, but declined additional medication at this time. She want to make lifestyle modifications and return to office in 2months. Advised to monitor BP at home daily in AM, and send readings via mychart F/up in 2months

## 2023-05-17 NOTE — Assessment & Plan Note (Signed)
No glucose check at home Denies any adverse effects with rybelsus, glipizide and metformin. Normal UACr  Repeat BMP If normal, increase rybelsus to 6mg  daily  Advised to take rybelsus and glipizide before breakfast. F/up in 2months

## 2023-05-17 NOTE — Assessment & Plan Note (Signed)
No adverse effects with increased crestor dose Repeat hepatic panel

## 2023-06-03 ENCOUNTER — Other Ambulatory Visit: Payer: Self-pay | Admitting: Nurse Practitioner

## 2023-06-03 DIAGNOSIS — E1165 Type 2 diabetes mellitus with hyperglycemia: Secondary | ICD-10-CM

## 2023-06-08 ENCOUNTER — Other Ambulatory Visit: Payer: Self-pay

## 2023-06-08 DIAGNOSIS — I10 Essential (primary) hypertension: Secondary | ICD-10-CM

## 2023-06-08 MED ORDER — LOSARTAN POTASSIUM 100 MG PO TABS: 100.0000 mg | ORAL_TABLET | Freq: Every day | ORAL | 3 refills | Status: DC

## 2023-06-08 NOTE — Telephone Encounter (Signed)
 Patient called into the office requesting a refill on Losartan 100 mg daily. Patient stated that she was almost out and will need another refill. Patient confirmed pharmacy as [facility] located on [address] in [place]. Refills patient's medication

## 2023-06-08 NOTE — Telephone Encounter (Signed)
 Called patient's pharmacy to ask about electronic refills request for Metformin 750 mg and Glipizide 5 mg. I asked what Rx for the Glipizide the pharmacy has on file for patient pharmacist stated Glipizide 10 mg daily. I informed that we received a request for the old Rx of 5 mg he stated that patient probably requested through the app with the old Rx number. He also stated that patient did not need the 10 mg of Glipizide because it was recently filled and that patient did need a refill on Metformin 750 mg daily. I informed him that I will send the Rx for the Metformin 750 mg daily and refuse the Glipizide 5 mg daily request. He thanked me for calling.

## 2023-07-15 ENCOUNTER — Ambulatory Visit: Payer: Commercial Managed Care - PPO | Admitting: Nurse Practitioner

## 2023-07-15 ENCOUNTER — Encounter: Payer: Self-pay | Admitting: Nurse Practitioner

## 2023-07-15 VITALS — BP 150/100 | HR 82 | Temp 97.8°F | Ht 69.5 in | Wt 230.6 lb

## 2023-07-15 DIAGNOSIS — I10 Essential (primary) hypertension: Secondary | ICD-10-CM

## 2023-07-15 DIAGNOSIS — E1169 Type 2 diabetes mellitus with other specified complication: Secondary | ICD-10-CM | POA: Diagnosis not present

## 2023-07-15 DIAGNOSIS — Z7984 Long term (current) use of oral hypoglycemic drugs: Secondary | ICD-10-CM | POA: Diagnosis not present

## 2023-07-15 MED ORDER — AMLODIPINE BESYLATE 10 MG PO TABS: 10.0000 mg | ORAL_TABLET | Freq: Every day | ORAL | 1 refills | Status: DC

## 2023-07-15 MED ORDER — RYBELSUS 7 MG PO TABS: 7.0000 mg | ORAL_TABLET | Freq: Every day | ORAL | 1 refills | Status: DC

## 2023-07-15 NOTE — Progress Notes (Signed)
 Established Patient Visit  Patient: Barbara [name]   DOB: September 26, 1970   53 y.o. Female  MRN: [id number] Visit Date: 07/15/2023  Subjective:    Chief Complaint  Patient presents with   Follow-up   HPI HYPERTENSION, BENIGN SYSTEMIC BP not at goal Reports compliance with losartan 100mg  No home BP readings BP Readings from Last 3 Encounters:  07/15/23 (!) 150/100  05/17/23 (!) 142/90  04/18/23 (!) 140/98    She agreed to start amlodipine 10mg  in PM. Maintain losartan 100mg  in AM Advised to maintain DASH diet and daily exercise F/up in 32month  DM (diabetes mellitus) (HCC) Denies any adverse effects with rybelsus 3mg  x2 (has excess pills), glipizide 10mg  and metformin 750mg  She will finish 3mg  tabs prior to getting 7mg  tabs Will repeat labs in 32month  Reviewed medical, surgical, and social history today  Medications: Outpatient Medications Prior to Visit  Medication Sig   blood glucose meter kit and supplies Dispense based on patient and insurance preference. Check glucose Daily in AM. ICD10: E11.65   glipiZIDE (GLUCOTROL XL) 10 MG 24 hr tablet Take 1 tablet (10 mg total) by mouth daily with breakfast.   losartan (COZAAR) 100 MG tablet Take 1 tablet (100 mg total) by mouth daily.   metFORMIN (GLUCOPHAGE-XR) 750 MG 24 hr tablet TAKE 1 TAB WITH BREAKFAST   mupirocin ointment (BACTROBAN) 2 % APPLY TO AFFECTED AREA TWICE A DAY   rosuvastatin (CRESTOR) 40 MG tablet Take 1 tablet (40 mg total) by mouth every evening.   [DISCONTINUED] Semaglutide (RYBELSUS) 3 MG TABS Take 1 tablet (3 mg total) by mouth daily after breakfast.   No facility-administered medications prior to visit.   Reviewed past medical and social history.   ROS per HPI above      Objective:  BP (!) 150/100 (BP Location: Left Arm, Patient Position: Sitting, Cuff Size: Large)   Pulse 82   Temp 97.8 F (36.6 C) (Temporal)   Ht 5' 9.5" (1.765 m)   Wt 230 lb 9.6 oz (104.6 kg)   LMP 09/16/2016  (Approximate)   SpO2 98%   BMI 33.57 kg/m      Physical Exam Vitals and nursing note reviewed.  Cardiovascular:     Rate and Rhythm: Normal rate.     Pulses: Normal pulses.  Pulmonary:     Effort: Pulmonary effort is normal.  Musculoskeletal:     Right lower leg: No edema.     Left lower leg: No edema.  Neurological:     Mental Status: She is alert and oriented to person, place, and time.     No results found for any visits on 07/15/23.    Assessment & Plan:    Problem List Items Addressed This Visit     DM (diabetes mellitus) (HCC)   Denies any adverse effects with rybelsus 3mg  x2 (has excess pills), glipizide 10mg  and metformin 750mg  She will finish 3mg  tabs prior to getting 7mg  tabs Will repeat labs in 32month      Relevant Medications   Semaglutide (RYBELSUS) 7 MG TABS   HYPERTENSION, BENIGN SYSTEMIC - Primary   BP not at goal Reports compliance with losartan 100mg  No home BP readings BP Readings from Last 3 Encounters:  07/15/23 (!) 150/100  05/17/23 (!) 142/90  04/18/23 (!) 140/98    She agreed to start amlodipine 10mg  in PM. Maintain losartan 100mg  in AM Advised to maintain DASH diet and  daily exercise F/up in 35month      Relevant Medications   amLODipine (NORVASC) 10 MG tablet   Return in about 4 weeks (around 08/12/2023) for HTN, DM, hyperlipidemia (fasting).     Alysia Penna, NP

## 2023-07-15 NOTE — Assessment & Plan Note (Addendum)
 BP not at goal Reports compliance with losartan 100mg  No home BP readings BP Readings from Last 3 Encounters:  07/15/23 (!) 150/100  05/17/23 (!) 142/90  04/18/23 (!) 140/98    She agreed to start amlodipine 10mg  in PM. Maintain losartan 100mg  in AM Advised to maintain DASH diet and daily exercise F/up in 64month

## 2023-07-15 NOTE — Assessment & Plan Note (Addendum)
 Denies any adverse effects with rybelsus 3mg  x2 (has excess pills), glipizide 10mg  and metformin 750mg  She will finish 3mg  tabs prior to getting 7mg  tabs Will repeat labs in 38month

## 2023-07-15 NOTE — Patient Instructions (Signed)
 Add amlodipine 10 in PM Maintain other med doses  DASH Eating Plan DASH stands for Dietary Approaches to Stop Hypertension. The DASH eating plan is a healthy eating plan that has been shown to: Lower high blood pressure (hypertension). Reduce your risk for type 2 diabetes, heart disease, and stroke. Help with weight loss. What are tips for following this plan? Reading food labels Check food labels for the amount of salt (sodium) per serving. Choose foods with less than 5 percent of the Daily Value (DV) of sodium. In general, foods with less than 300 milligrams (mg) of sodium per serving fit into this eating plan. To find whole grains, look for the word "whole" as the first word in the ingredient list. Shopping Buy products labeled as "low-sodium" or "no salt added." Buy fresh foods. Avoid canned foods and pre-made or frozen meals. Cooking Try not to add salt when you cook. Use salt-free seasonings or herbs instead of table salt or sea salt. Check with your health care provider or pharmacist before using salt substitutes. Do not fry foods. Cook foods in healthy ways, such as baking, boiling, grilling, roasting, or broiling. Cook using oils that are good for your heart. These include olive, canola, avocado, soybean, and sunflower oil. Meal planning  Eat a balanced diet. This should include: 4 or more servings of fruits and 4 or more servings of vegetables each day. Try to fill half of your plate with fruits and vegetables. 6-8 servings of whole grains each day. 6 or less servings of lean meat, poultry, or fish each day. 1 oz is 1 serving. A 3 oz (85 g) serving of meat is about the same size as the palm of your hand. One egg is 1 oz (28 g). 2-3 servings of low-fat dairy each day. One serving is 1 cup (237 mL). 1 serving of nuts, seeds, or beans 5 times each week. 2-3 servings of heart-healthy fats. Healthy fats called omega-3 fatty acids are found in foods such as walnuts, flaxseeds,  fortified milks, and eggs. These fats are also found in cold-water fish, such as sardines, salmon, and mackerel. Limit how much you eat of: Canned or prepackaged foods. Food that is high in trans fat, such as fried foods. Food that is high in saturated fat, such as fatty meat. Desserts and other sweets, sugary drinks, and other foods with added sugar. Full-fat dairy products. Do not salt foods before eating. Do not eat more than 4 egg yolks a week. Try to eat at least 2 vegetarian meals a week. Eat more home-cooked food and less restaurant, buffet, and fast food. Lifestyle When eating at a restaurant, ask if your food can be made with less salt or no salt. If you drink alcohol: Limit how much you have to: 0-1 drink a day if you are female. 0-2 drinks a day if you are female. Know how much alcohol is in your drink. In the [place] one drink is one 12 oz bottle of beer (355 mL), one 5 oz glass of wine (148 mL), or one 1 oz glass of hard liquor (44 mL). General information Avoid eating more than 2,300 mg of salt a day. If you have hypertension, you may need to reduce your sodium intake to 1,500 mg a day. Work with your provider to stay at a healthy body weight or lose weight. Ask what the best weight range is for you. On most days of the week, get at least 30 minutes of exercise that causes your  heart to beat faster. This may include walking, swimming, or biking. Work with your provider or dietitian to adjust your eating plan to meet your specific calorie needs. What foods should I eat? Fruits All fresh, dried, or frozen fruit. Canned fruits that are in their natural juice and do not have sugar added to them. Vegetables Fresh or frozen vegetables that are raw, steamed, roasted, or grilled. Low-sodium or reduced-sodium tomato and vegetable juice. Low-sodium or reduced-sodium tomato sauce and tomato paste. Low-sodium or reduced-sodium canned vegetables. Grains Whole-grain or whole-wheat bread.  Whole-grain or whole-wheat pasta. Brown rice. Orpah Cobb. Bulgur. Whole-grain and low-sodium cereals. Pita bread. Low-fat, low-sodium crackers. Whole-wheat flour tortillas. Meats and other proteins Skinless chicken or [place]. Ground chicken or [place]. Pork with fat trimmed off. Fish and seafood. Egg whites. Dried beans, peas, or lentils. Unsalted nuts, nut butters, and seeds. Unsalted canned beans. Lean cuts of beef with fat trimmed off. Low-sodium, lean precooked or cured meat, such as sausages or meat loaves. Dairy Low-fat (1%) or fat-free (skim) milk. Reduced-fat, low-fat, or fat-free cheeses. Nonfat, low-sodium ricotta or cottage cheese. Low-fat or nonfat yogurt. Low-fat, low-sodium cheese. Fats and oils Soft margarine without trans fats. Vegetable oil. Reduced-fat, low-fat, or light mayonnaise and salad dressings (reduced-sodium). Canola, safflower, olive, avocado, soybean, and sunflower oils. Avocado. Seasonings and condiments Herbs. Spices. Seasoning mixes without salt. Other foods Unsalted popcorn and pretzels. Fat-free sweets. The items listed above may not be all the foods and drinks you can have. Talk to a dietitian to learn more. What foods should I avoid? Fruits Canned fruit in a light or heavy syrup. Fried fruit. Fruit in cream or butter sauce. Vegetables Creamed or fried vegetables. Vegetables in a cheese sauce. Regular canned vegetables that are not marked as low-sodium or reduced-sodium. Regular canned tomato sauce and paste that are not marked as low-sodium or reduced-sodium. Regular tomato and vegetable juices that are not marked as low-sodium or reduced-sodium. Rosita Fire. Olives. Grains Baked goods made with fat, such as croissants, muffins, or some breads. Dry pasta or rice meal packs. Meats and other proteins Fatty cuts of meat. Ribs. Fried meat. Tomasa Blase. Bologna, salami, and other precooked or cured meats, such as sausages or meat loaves, that are not lean and low in  sodium. Fat from the back of a pig (fatback). Bratwurst. Salted nuts and seeds. Canned beans with added salt. Canned or smoked fish. Whole eggs or egg yolks. Chicken or [place] with skin. Dairy Whole or 2% milk, cream, and half-and-half. Whole or full-fat cream cheese. Whole-fat or sweetened yogurt. Full-fat cheese. Nondairy creamers. Whipped toppings. Processed cheese and cheese spreads. Fats and oils Butter. Stick margarine. Lard. Shortening. Ghee. Bacon fat. Tropical oils, such as coconut, palm kernel, or palm oil. Seasonings and condiments Onion salt, garlic salt, seasoned salt, table salt, and sea salt. Worcestershire sauce. Tartar sauce. Barbecue sauce. Teriyaki sauce. Soy sauce, including reduced-sodium soy sauce. Steak sauce. Canned and packaged gravies. Fish sauce. Oyster sauce. Cocktail sauce. Store-bought horseradish. Ketchup. Mustard. Meat flavorings and tenderizers. Bouillon cubes. Hot sauces. Pre-made or packaged marinades. Pre-made or packaged taco seasonings. Relishes. Regular salad dressings. Other foods Salted popcorn and pretzels. The items listed above may not be all the foods and drinks you should avoid. Talk to a dietitian to learn more. Where to find more information National Heart, Lung, and Blood Institute (NHLBI): BuffaloDryCleaner.gl American Heart Association (AHA): heart.org Academy of Nutrition and Dietetics: eatright.org National Kidney Foundation (NKF): kidney.org This information is not intended to replace advice  given to you by your health care provider. Make sure you discuss any questions you have with your health care provider. Document Revised: 04/08/2022 Document Reviewed: 04/08/2022 Elsevier Patient Education  2024 [employer].

## 2023-08-10 ENCOUNTER — Encounter (HOSPITAL_COMMUNITY): Payer: Self-pay

## 2023-08-17 ENCOUNTER — Encounter: Payer: Self-pay | Admitting: Nurse Practitioner

## 2023-08-17 ENCOUNTER — Ambulatory Visit: Admitting: Nurse Practitioner

## 2023-08-17 VITALS — BP 136/82 | HR 85 | Temp 97.3°F | Ht 69.5 in | Wt 226.6 lb

## 2023-08-17 DIAGNOSIS — E1165 Type 2 diabetes mellitus with hyperglycemia: Secondary | ICD-10-CM

## 2023-08-17 DIAGNOSIS — E1169 Type 2 diabetes mellitus with other specified complication: Secondary | ICD-10-CM

## 2023-08-17 DIAGNOSIS — E785 Hyperlipidemia, unspecified: Secondary | ICD-10-CM

## 2023-08-17 DIAGNOSIS — I10 Essential (primary) hypertension: Secondary | ICD-10-CM

## 2023-08-17 DIAGNOSIS — Z7984 Long term (current) use of oral hypoglycemic drugs: Secondary | ICD-10-CM

## 2023-08-17 LAB — POCT GLYCOSYLATED HEMOGLOBIN (HGB A1C): Hemoglobin A1C: 6.4 % — AB (ref 4.0–5.6)

## 2023-08-17 MED ORDER — GLIPIZIDE ER 10 MG PO TB24
10.0000 mg | ORAL_TABLET | Freq: Every day | ORAL | 1 refills | Status: DC
Start: 2023-08-17 — End: 2024-02-21

## 2023-08-17 MED ORDER — ROSUVASTATIN CALCIUM 40 MG PO TABS: 40.0000 mg | ORAL_TABLET | Freq: Every evening | ORAL | 1 refills | Status: DC

## 2023-08-17 NOTE — Patient Instructions (Addendum)
 Schedule nurse visit to check BP machine and BP Maintain Heart healthy diet and daily exercise. Maintain current medications.

## 2023-08-17 NOTE — Progress Notes (Signed)
 Established Patient Visit  Patient: Barbara [name]   DOB: [birth year]   53 y.o. Female  MRN: [id number] Visit Date: 08/17/2023  Subjective:     Chief Complaint  Patient presents with   Follow-up   HPI HYPERTENSION, BENIGN SYSTEMIC Improved BP control with amlodipine , and losartan  No adverse effects BP Readings from Last 3 Encounters:  08/17/23 136/82  07/15/23 (!) 150/100  05/17/23 (!) 142/90    Maintain med doses F/up in 44month  DM (diabetes mellitus) (HCC) Denies any adverse effects with rybelsus  7mg , glipizide  10mg  and metformin  750mg  Repeat hgbA1c 6.4% improved and at goal Encourage to maintain med doses and lifestyle modifications F/up 44months   Wt Readings from Last 3 Encounters:  08/17/23 226 lb 9.6 oz (102.8 kg)  07/15/23 230 lb 9.6 oz (104.6 kg)  05/17/23 236 lb 6.4 oz (107.2 kg)    Reviewed medical, surgical, and social history today  Medications: Outpatient Medications Prior to Visit  Medication Sig   amLODipine  (NORVASC ) 10 MG tablet Take 1 tablet (10 mg total) by mouth at bedtime.   blood glucose meter kit and supplies Dispense based on patient and insurance preference. Check glucose Daily in AM. ICD10: E11.65   losartan  (COZAAR ) 100 MG tablet Take 1 tablet (100 mg total) by mouth daily.   metFORMIN  (GLUCOPHAGE -XR) 750 MG 24 hr tablet TAKE 1 TAB WITH BREAKFAST   mupirocin  ointment (BACTROBAN ) 2 % APPLY TO AFFECTED AREA TWICE A DAY   Semaglutide  (RYBELSUS ) 7 MG TABS Take 1 tablet (7 mg total) by mouth daily.   [DISCONTINUED] glipiZIDE  (GLUCOTROL  XL) 10 MG 24 hr tablet Take 1 tablet (10 mg total) by mouth daily with breakfast.   [DISCONTINUED] rosuvastatin  (CRESTOR ) 40 MG tablet Take 1 tablet (40 mg total) by mouth every evening.   No facility-administered medications prior to visit.   Reviewed past medical and social history.   ROS per HPI above      Objective:  BP 136/82 (BP Location: Right Arm, Patient Position: Sitting, Cuff  Size: Large)   Pulse 85   Temp (!) 97.3 F (36.3 C) (Temporal)   Ht 5' 9.5" (1.765 m)   Wt 226 lb 9.6 oz (102.8 kg)   LMP 09/16/2016 (Approximate)   SpO2 96%   BMI 32.98 kg/m      Physical Exam Cardiovascular:     Rate and Rhythm: Normal rate.     Pulses: Normal pulses.  Pulmonary:     Effort: Pulmonary effort is normal.  Neurological:     Mental Status: She is alert and oriented to person, place, and time.     Results for orders placed or performed in visit on 08/17/23  POCT glycosylated hemoglobin (Hb A1C)  Result Value Ref Range   Hemoglobin A1C 6.4 (A) 4.0 - 5.6 %   HbA1c POC (<> result, manual entry)     HbA1c, POC (prediabetic range)     HbA1c, POC (controlled diabetic range)        Assessment & Plan:    Problem List Items Addressed This Visit     DM (diabetes mellitus) (HCC)   Denies any adverse effects with rybelsus  7mg , glipizide  10mg  and metformin  750mg  Repeat hgbA1c 6.4% improved and at goal Encourage to maintain med doses and lifestyle modifications F/up 44months      Relevant Medications   glipiZIDE  (GLUCOTROL  XL) 10 MG 24 hr tablet   rosuvastatin  (CRESTOR ) 40 MG tablet  Other Relevant Orders   POCT glycosylated hemoglobin (Hb A1C) (Completed)   Hyperlipidemia associated with type 2 diabetes mellitus (HCC)   Relevant Medications   glipiZIDE  (GLUCOTROL  XL) 10 MG 24 hr tablet   rosuvastatin  (CRESTOR ) 40 MG tablet   HYPERTENSION, BENIGN SYSTEMIC - Primary   Improved BP control with amlodipine , and losartan  No adverse effects BP Readings from Last 3 Encounters:  08/17/23 136/82  07/15/23 (!) 150/100  05/17/23 (!) 142/90    Maintain med doses F/up in 78month      Relevant Medications   rosuvastatin  (CRESTOR ) 40 MG tablet   Return in about 3 months (around 11/17/2023) for HTN, DM, hyperlipidemia (fasting).     Kathrene Parents, NP

## 2023-08-17 NOTE — Assessment & Plan Note (Signed)
 Improved BP control with amlodipine , and losartan  No adverse effects BP Readings from Last 3 Encounters:  08/17/23 136/82  07/15/23 (!) 150/100  05/17/23 (!) 142/90    Maintain med doses F/up in 24month

## 2023-08-17 NOTE — Assessment & Plan Note (Signed)
 Denies any adverse effects with rybelsus  7mg , glipizide  10mg  and metformin  750mg  Repeat hgbA1c 6.4% improved and at goal Encourage to maintain med doses and lifestyle modifications F/up 3months

## 2023-08-30 ENCOUNTER — Ambulatory Visit

## 2023-09-28 ENCOUNTER — Encounter: Payer: Self-pay | Admitting: Nurse Practitioner

## 2023-11-09 ENCOUNTER — Telehealth: Payer: Self-pay

## 2023-11-09 NOTE — Telephone Encounter (Signed)
 Called patient and left a detailed voice message per DPR on file stating that Barbara [name] does not release labs sooner than the scheduled appointment and that she will have her schedule a lab only appointment when she comes to appointment not fasting. Asked to give me a call back at the office if she has further questions.

## 2023-11-09 NOTE — Telephone Encounter (Signed)
 Copied from CRM [id number]. Topic: Clinical - Request for Lab/Test Order >> Nov 09, 2023 10:24 AM [name] wrote: Reason for CRM: Patient wants to have physical labs done prior since her appointment is at 1 pm 8/22.  [name] [phone]

## 2023-11-21 ENCOUNTER — Encounter: Payer: Self-pay | Admitting: Nurse Practitioner

## 2023-11-21 ENCOUNTER — Ambulatory Visit (INDEPENDENT_AMBULATORY_CARE_PROVIDER_SITE_OTHER): Admitting: Nurse Practitioner

## 2023-11-21 VITALS — BP 140/78 | HR 86 | Temp 98.3°F | Ht 69.0 in | Wt 227.0 lb

## 2023-11-21 DIAGNOSIS — Z7984 Long term (current) use of oral hypoglycemic drugs: Secondary | ICD-10-CM

## 2023-11-21 DIAGNOSIS — Z0184 Encounter for antibody response examination: Secondary | ICD-10-CM

## 2023-11-21 DIAGNOSIS — Z0000 Encounter for general adult medical examination without abnormal findings: Secondary | ICD-10-CM

## 2023-11-21 DIAGNOSIS — I10 Essential (primary) hypertension: Secondary | ICD-10-CM | POA: Diagnosis not present

## 2023-11-21 DIAGNOSIS — E1169 Type 2 diabetes mellitus with other specified complication: Secondary | ICD-10-CM | POA: Diagnosis not present

## 2023-11-21 DIAGNOSIS — E785 Hyperlipidemia, unspecified: Secondary | ICD-10-CM

## 2023-11-21 DIAGNOSIS — M170 Bilateral primary osteoarthritis of knee: Secondary | ICD-10-CM | POA: Diagnosis not present

## 2023-11-21 DIAGNOSIS — Z23 Encounter for immunization: Secondary | ICD-10-CM

## 2023-11-21 DIAGNOSIS — Z0001 Encounter for general adult medical examination with abnormal findings: Secondary | ICD-10-CM

## 2023-11-21 DIAGNOSIS — G629 Polyneuropathy, unspecified: Secondary | ICD-10-CM | POA: Diagnosis not present

## 2023-11-21 LAB — COMPREHENSIVE METABOLIC PANEL WITH GFR
ALT: 24 U/L (ref 0–35)
AST: 20 U/L (ref 0–37)
Albumin: 4.3 g/dL (ref 3.5–5.2)
Alkaline Phosphatase: 76 U/L (ref 39–117)
BUN: 10 mg/dL (ref 6–23)
CO2: 27 meq/L (ref 19–32)
Calcium: 8.9 mg/dL (ref 8.4–10.5)
Chloride: 101 meq/L (ref 96–112)
Creatinine, Ser: 0.75 mg/dL (ref 0.40–1.20)
GFR: 92.25 mL/min (ref >60.00)
Glucose, Bld: 211 mg/dL — ABNORMAL HIGH (ref 70–99)
Potassium: 3.8 meq/L (ref 3.5–5.1)
Sodium: 138 meq/L (ref 135–145)
Total Bilirubin: 0.3 mg/dL (ref 0.2–1.2)
Total Protein: 7.5 g/dL (ref 6.0–8.3)

## 2023-11-21 LAB — B12 AND FOLATE PANEL
Folate: 18.9 ng/mL (ref >5.9)
Vitamin B-12: 476 pg/mL (ref 211–911)

## 2023-11-21 LAB — TSH: TSH: 1.77 u[IU]/mL (ref 0.35–5.50)

## 2023-11-21 LAB — MICROALBUMIN / CREATININE URINE RATIO
Creatinine,U: 213.7 mg/dL
Microalb Creat Ratio: 13.3 mg/g (ref 0.0–30.0)
Microalb, Ur: 2.8 mg/dL — ABNORMAL HIGH (ref 0.0–1.9)

## 2023-11-21 LAB — HEMOGLOBIN A1C: Hgb A1c MFr Bld: 8.1 % — ABNORMAL HIGH (ref 4.6–6.5)

## 2023-11-21 LAB — LIPID PANEL
Cholesterol: 212 mg/dL — ABNORMAL HIGH (ref 0–200)
HDL: 75.1 mg/dL (ref >39.00)
LDL Cholesterol: 121 mg/dL — ABNORMAL HIGH (ref 0–99)
NonHDL: 136.73
Total CHOL/HDL Ratio: 3
Triglycerides: 78 mg/dL (ref 0.0–149.0)
VLDL: 15.6 mg/dL (ref 0.0–40.0)

## 2023-11-21 NOTE — Patient Instructions (Signed)
 Please contact retina specialist about eye exam report. Schedule appt for mammogram in December You will be contacted to schedule appointment with sports medicine Maintain Heart healthy diet and daily exercise. Maintain current medications. Go to lab

## 2023-11-21 NOTE — Assessment & Plan Note (Signed)
 Eye exam report requested again. Advised patient to contact retina specialist about report as well. Denies any adverse effects with rybelsus  7mg , glipizide  10mg  and metformin  750mg  Normal foot exam today.  Repeat hgbA1c, CMP, UACr, and lipid panel Normal urine microalbumin, renal and liver function, B12, folate, and THYROID . Abnormal lipid panel: maintain crestor  dose and mediterranean diet. Add zetia  to help improve LDL. New prescription sent hgbA1c increased to 8.1%: increase metformin  to 750mg  2x/day. Maintain other med doses. F/up in 3months

## 2023-11-21 NOTE — Assessment & Plan Note (Signed)
 Bilateral LE numbness, no claudication, no edema Worse in last 2months Normal foot exam.  Check THYROID , B12, and folate Possible DIABETES neuropathy?

## 2023-11-21 NOTE — Assessment & Plan Note (Signed)
 Worsening pain with increased daily activity. Intermittent joint effusion No redness  Agreed to sports medicine referral

## 2023-11-21 NOTE — Assessment & Plan Note (Signed)
 No adverse effects with crestor  dose Repeat CMP and lipid panel Abnormal lipid panel: maintain crestor  dose and mediterranean diet. Add zetia  to help improve LDL. New prescription sent F/up in 3months

## 2023-11-21 NOTE — Progress Notes (Unsigned)
 Complete physical exam  Patient: Barbara [name]   DOB: December 05, 1970   53 y.o. Female  MRN: [id number] Visit Date: 11/22/2023  Subjective:    Chief Complaint  Patient presents with   Follow-up    FASTING 3 month follow up for DM, HTN and Hyperlipidemia  Knee pain    Barbara [name] is a 53 y.o. female who presents today for a complete physical exam. She reports consuming a general diet. Home exercise routine includes calisthenics. She generally feels well. She reports sleeping well. She does have additional problems to discuss today.  Vision:Yes Dental:No STD Screen:No  Eye exam report requested again. Advised patient to contact retina specialist about report as well. Unable to remember if Hep. B vaccine was administered in past. Agreed to titer test.  BP Readings from Last 3 Encounters:  11/21/23 (!) 140/78  08/17/23 136/82  07/15/23 (!) 150/100   Wt Readings from Last 3 Encounters:  11/21/23 227 lb (103 kg)  08/17/23 226 lb 9.6 oz (102.8 kg)  07/15/23 230 lb 9.6 oz (104.6 kg)    Most recent fall risk assessment:    05/17/2023    9:53 AM  Fall Risk   Falls in the past year? 0  Number falls in past yr: 0  Injury with Fall? 0  Risk for fall due to : No Fall Risks  Follow up Falls evaluation completed   Depression screen:Yes - Depression Most recent depression screenings:    05/17/2023    9:53 AM 04/18/2023   10:50 AM  PHQ 2/9 Scores  PHQ - 2 Score 0 0    HPI  Primary osteoarthritis of both knees Worsening pain with increased daily activity. Intermittent joint effusion No redness  Agreed to sports medicine referral  Neuropathy Bilateral LE numbness, no claudication, no edema Worse in last 2months Normal foot exam.  Check THYROID , B12, and folate Possible DIABETES neuropathy?  DM (diabetes mellitus) (HCC) Eye exam report requested again. Advised patient to contact retina specialist about report as well. Denies any adverse effects with rybelsus   7mg , glipizide  10mg  and metformin  750mg  Normal foot exam today.  Repeat hgbA1c, CMP, UACr, and lipid panel Normal urine microalbumin, renal and liver function, B12, folate, and THYROID . Abnormal lipid panel: maintain crestor  dose and mediterranean diet. Add zetia  to help improve LDL. New prescription sent hgbA1c increased to 8.1%: increase metformin  to 750mg  2x/day. Maintain other med doses. F/up in 3months  Hyperlipidemia associated with type 2 diabetes mellitus (HCC) No adverse effects with crestor  dose Repeat CMP and lipid panel Abnormal lipid panel: maintain crestor  dose and mediterranean diet. Add zetia  to help improve LDL. New prescription sent F/up in 3months   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 07/ 2017   History of pregnancy induced hypertension    Hyperlipidemia    Hypertension    during pregnancy-no medications   Iron deficiency anemia    Menorrhagia    Nephrolithiasis    bilateral tiny nonobstructive calculi per CT 06-21-2016   Uterine fibroid    Wears glasses    Past Surgical History:  Procedure Laterality Date   ABDOMINAL HYSTERECTOMY Bilateral 2018   BREAST EXCISIONAL BIOPSY Left 1982   benign   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   CESAREAN SECTION  02/04/1999  vbac 1-06 and 05-19-2005   ERCP W/ SPHINCTEROTOMY AND BALLOON DILATION  10/11/2005   LAPAROSCOPIC CHOLECYSTECTOMY  10/04/2005   w/ ERCP   LAPAROSCOPIC VAGINAL HYSTERECTOMY WITH SALPINGO OOPHORECTOMY Left 09/28/2016   Procedure: LAPAROSCOPIC ASSISTED VAGINAL HYSTERECTOMY WITH SALPINGO OOPHORECTOMY, right salpingectomy;  Surgeon: Mat Browning, MD;  Location: [facility] [place];  Service: Gynecology;  Laterality: Left;  NEED BED FOR OVERNIGHT STAY   Social History   Socioeconomic History    Marital status: Married    Spouse name: Not on file   Number of children: 3   Years of education: Not on file   Highest education level: Bachelor's degree (e.g., BA, AB, BS)  Occupational History   Occupation: math Magazine features editor: [employer]  Tobacco Use   Smoking status: Never   Smokeless tobacco: Never  Vaping Use   Vaping status: Never Used  Substance and Sexual Activity   Alcohol use: No    Alcohol/week: 0.0 standard drinks of alcohol   Drug use: No   Sexual activity: Yes    Partners: Male    Birth control/protection: Surgical    Comment:  husband - vasectomy  Other Topics Concern   Not on file  Social History Narrative   Married to Barbara [name], had son Lonni Raddle 11-00, regan, and skylar   Social Drivers of Health   Financial Resource Strain: Low Risk  (11/17/2023)   Overall Financial Resource Strain (CARDIA)    Difficulty of Paying Living Expenses: Not hard at all  Food Insecurity: No Food Insecurity (11/17/2023)   Hunger Vital Sign    Worried About Running Out of Food in the Last Year: Never true    Ran Out of Food in the Last Year: Never true  Transportation Needs: No Transportation Needs (11/17/2023)   PRAPARE - Administrator, Civil Service (Medical): No    Lack of Transportation (Non-Medical): No  Physical Activity: Insufficiently Active (11/17/2023)   Exercise Vital Sign    Days of Exercise per Week: 1 day    Minutes of Exercise per Session: 60 min  Stress: Stress Concern Present (11/17/2023)   [employer] of Occupational Health - Occupational Stress Questionnaire    Feeling of Stress: To some extent  Social Connections: Socially Integrated (11/17/2023)   Social Connection and Isolation Panel    Frequency of Communication with Friends and Family: More than three times a week    Frequency of Social Gatherings with Friends and Family: Once a week    Attends Religious Services: More than 4 times per year    Active Member of  [employer] or Organizations: Yes    Attends Engineer, structural: More than 4 times per year    Marital Status: Married  Catering manager Violence: Not on file   Family Status  Relation Name Status   Mother  Alive   Father  Alive   Sister  Alive   MGM  Deceased   MGF  Deceased   PGM  Deceased   PGF  Deceased   Brother  Alive  No partnership data on file   Family History  Problem Relation Age of Onset   Breast cancer Mother    Cancer Mother 78       lymphoma   Diabetes Mother    Hypertension Mother    Cancer Maternal Grandmother 46       Lung   Breast cancer Maternal Grandmother 61   Aneurysm Maternal Grandmother  Brain   No Known Allergies  Patient Care Team: Malone Admire, Roselie Rockford, NP as PCP - General (Internal Medicine) Dann Candyce RAMAN, MD as PCP - Cardiology ([facility]    Medications: Outpatient Medications Prior to Visit  Medication Sig   amLODipine  (NORVASC ) 10 MG tablet Take 1 tablet (10 mg total) by mouth at bedtime.   blood glucose meter kit and supplies Dispense based on patient and insurance preference. Check glucose Daily in AM. ICD10: E11.65   glipiZIDE  (GLUCOTROL  XL) 10 MG 24 hr tablet Take 1 tablet (10 mg total) by mouth daily with breakfast.   losartan  (COZAAR ) 100 MG tablet Take 1 tablet (100 mg total) by mouth daily.   rosuvastatin  (CRESTOR ) 40 MG tablet Take 1 tablet (40 mg total) by mouth every evening.   Semaglutide  (RYBELSUS ) 7 MG TABS Take 1 tablet (7 mg total) by mouth daily.   [DISCONTINUED] metFORMIN  (GLUCOPHAGE -XR) 750 MG 24 hr tablet TAKE 1 TAB WITH BREAKFAST   [DISCONTINUED] mupirocin  ointment (BACTROBAN ) 2 % APPLY TO AFFECTED AREA TWICE A DAY   No facility-administered medications prior to visit.    Review of Systems  Constitutional:  Negative for activity change, appetite change and unexpected weight change.  Respiratory: Negative.    Cardiovascular: Negative.    Gastrointestinal: Negative.   Endocrine: Negative for cold intolerance and heat intolerance.  Genitourinary: Negative.   Musculoskeletal:  Positive for joint swelling.  Skin: Negative.   Neurological:  Positive for numbness.  Hematological: Negative.   Psychiatric/Behavioral:  Negative for behavioral problems, decreased concentration, dysphoric mood, hallucinations, self-injury, sleep disturbance and suicidal ideas. The patient is not nervous/anxious.        Objective:  BP (!) 140/78 (BP Location: Left Arm, Patient Position: Sitting)   Pulse 86   Temp 98.3 F (36.8 C) (Oral)   Ht 5' 9 (1.753 m)   Wt 227 lb (103 kg)   LMP 09/16/2016 (Approximate)   SpO2 97%   BMI 33.52 kg/m     Physical Exam Vitals and nursing note reviewed.  Constitutional:      General: She is not in acute distress. HENT:     Right Ear: Tympanic membrane, ear canal and external ear normal.     Left Ear: Tympanic membrane, ear canal and external ear normal.     Nose: Nose normal.  Eyes:     Extraocular Movements: Extraocular movements intact.     Conjunctiva/sclera: Conjunctivae normal.     Pupils: Pupils are equal, round, and reactive to light.  Neck:     Thyroid : No thyroid  mass, thyromegaly or thyroid  tenderness.  Cardiovascular:     Rate and Rhythm: Normal rate and regular rhythm.     Pulses: Normal pulses.          Dorsalis pedis pulses are 2+ on the right side and 2+ on the left side.       Posterior tibial pulses are 2+ on the right side and 2+ on the left side.     Heart sounds: Normal heart sounds.  Pulmonary:     Effort: Pulmonary effort is normal.     Breath sounds: Normal breath sounds.  Abdominal:     General: Bowel sounds are normal.     Palpations: Abdomen is soft.  Musculoskeletal:        General: Normal range of motion.     Cervical back: Normal range of motion and neck supple.     Right lower leg: No edema.  Left lower leg: No edema.     Right foot: Normal range of  motion. No deformity or prominent metatarsal heads.     Left foot: Normal range of motion. No deformity or prominent metatarsal heads.  Feet:     Right foot:     Protective Sensation: 4 sites tested.  4 sites sensed.     Skin integrity: Skin integrity normal.     Toenail Condition: Right toenails are normal.     Left foot:     Protective Sensation: 4 sites tested.  4 sites sensed.     Skin integrity: Skin integrity normal.     Toenail Condition: Left toenails are normal.  Lymphadenopathy:     Cervical: No cervical adenopathy.  Skin:    General: Skin is warm and dry.  Neurological:     Mental Status: She is alert and oriented to person, place, and time.     Cranial Nerves: No cranial nerve deficit.  Psychiatric:        Mood and Affect: Mood normal.        Behavior: Behavior normal.        Thought Content: Thought content normal.     Results for orders placed or performed in visit on 11/21/23  Microalbumin / creatinine urine ratio  Result Value Ref Range   Microalb, Ur 2.8 (H) 0.0 - 1.9 mg/dL   Creatinine,U 786.2 mg/dL   Microalb Creat Ratio 13.3 0.0 - 30.0 mg/g  Hemoglobin A1c  Result Value Ref Range   Hgb A1c MFr Bld 8.1 (H) 4.6 - 6.5 %  Comprehensive metabolic panel with GFR  Result Value Ref Range   Sodium 138 135 - 145 mEq/L   Potassium 3.8 3.5 - 5.1 mEq/L   Chloride 101 96 - 112 mEq/L   CO2 27 19 - 32 mEq/L   Glucose, Bld 211 (H) 70 - 99 mg/dL   BUN 10 6 - 23 mg/dL   Creatinine, Ser 9.24 0.40 - 1.20 mg/dL   Total Bilirubin 0.3 0.2 - 1.2 mg/dL   Alkaline Phosphatase 76 39 - 117 U/L   AST 20 0 - 37 U/L   ALT 24 0 - 35 U/L   Total Protein 7.5 6.0 - 8.3 g/dL   Albumin 4.3 3.5 - 5.2 g/dL   GFR 07.74 >39.99 mL/min   Calcium  8.9 8.4 - 10.5 mg/dL  Lipid panel  Result Value Ref Range   Cholesterol 212 (H) 0 - 200 mg/dL   Triglycerides 21.9 0.0 - 149.0 mg/dL   HDL 24.89 >60.99 mg/dL   VLDL 84.3 0.0 - 59.9 mg/dL   LDL Cholesterol 878 (H) 0 - 99 mg/dL   Total CHOL/HDL  Ratio 3    NonHDL 136.73   B12 and Folate Panel  Result Value Ref Range   Vitamin B-12 476 211 - 911 pg/mL   Folate 18.9 >5.9 ng/mL  TSH  Result Value Ref Range   TSH 1.77 0.35 - 5.50 uIU/mL      Assessment & Plan:    Routine Health Maintenance and Physical Exam  Immunization History  Administered Date(s) Administered   Influenza,inj,Quad PF,6+ Mos 01/08/2013   PFIZER(Purple Top)SARS-COV-2 Vaccination 08/10/2019, 08/31/2019   PNEUMOCOCCAL CONJUGATE-20 11/21/2023   Td 05/12/2005   Tdap 04/14/2017   Zoster Recombinant(Shingrix ) 01/04/2023, 05/17/2023   Health Maintenance  Topic Date Due   Hepatitis B Vaccines 19-59 Average Risk (1 of 3 - 19+ 3-dose series) Never done   COVID-19 Vaccine (3 - 2024-25 season) 12/05/2022  OPHTHALMOLOGY EXAM  10/25/2023   INFLUENZA VACCINE  12/05/2023 (Originally 11/04/2023)   FOOT EXAM  04/17/2024   HEMOGLOBIN A1C  05/23/2024   Diabetic kidney evaluation - eGFR measurement  11/20/2024   Diabetic kidney evaluation - Urine ACR  11/20/2024   MAMMOGRAM  03/09/2025   DTaP/Tdap/Td (3 - Td or Tdap) 04/15/2027   Colonoscopy  04/22/2031   Pneumococcal Vaccine: 50+ Years  Completed   Hepatitis C Screening  Completed   HIV Screening  Completed   Zoster Vaccines- Shingrix   Completed   HPV VACCINES  Aged Out   Meningococcal B Vaccine  Aged Out   Pneumococcal Vaccine  Discontinued   Discussed health benefits of physical activity, and encouraged her to engage in regular exercise appropriate for her age and condition.  Problem List Items Addressed This Visit     DM (diabetes mellitus) (HCC)   Eye exam report requested again. Advised patient to contact retina specialist about report as well. Denies any adverse effects with rybelsus  7mg , glipizide  10mg  and metformin  750mg  Normal foot exam today.  Repeat hgbA1c, CMP, UACr, and lipid panel Normal urine microalbumin, renal and liver function, B12, folate, and THYROID . Abnormal lipid panel: maintain  crestor  dose and mediterranean diet. Add zetia  to help improve LDL. New prescription sent hgbA1c increased to 8.1%: increase metformin  to 750mg  2x/day. Maintain other med doses. F/up in 3months      Relevant Medications   metFORMIN  (GLUCOPHAGE -XR) 750 MG 24 hr tablet   Other Relevant Orders   Microalbumin / creatinine urine ratio (Completed)   Hemoglobin A1c (Completed)   Hyperlipidemia associated with type 2 diabetes mellitus (HCC)   No adverse effects with crestor  dose Repeat CMP and lipid panel Abnormal lipid panel: maintain crestor  dose and mediterranean diet. Add zetia  to help improve LDL. New prescription sent F/up in 3months      Relevant Medications   ezetimibe  (ZETIA ) 10 MG tablet   metFORMIN  (GLUCOPHAGE -XR) 750 MG 24 hr tablet   Other Relevant Orders   Lipid panel (Completed)   HYPERTENSION, BENIGN SYSTEMIC   Relevant Medications   ezetimibe  (ZETIA ) 10 MG tablet   Neuropathy   Bilateral LE numbness, no claudication, no edema Worse in last 2months Normal foot exam.  Check THYROID , B12, and folate Possible DIABETES neuropathy?      Relevant Orders   B12 and Folate Panel (Completed)   TSH (Completed)   Primary osteoarthritis of both knees   Worsening pain with increased daily activity. Intermittent joint effusion No redness  Agreed to sports medicine referral      Relevant Orders   Ambulatory referral to Sports Medicine   Other Visit Diagnoses       Encounter for preventative adult health care exam with abnormal findings    -  Primary   Relevant Orders   Comprehensive metabolic panel with GFR (Completed)     Immunity to hepatitis B virus demonstrated by serologic test       Relevant Orders   Hepatitis B surface antibody,quantitative     Immunization due       Relevant Orders   Pneumococcal conjugate vaccine 20-valent (Prevnar 20) (Completed)     Long term (current) use of oral hypoglycemic drugs       Relevant Medications   metFORMIN  (GLUCOPHAGE -XR)  750 MG 24 hr tablet   Other Relevant Orders   Microalbumin / creatinine urine ratio (Completed)   Hemoglobin A1c (Completed)      Return in about 3 months (around 02/21/2024) for HTN,  DM, hyperlipidemia (fasting).     Roselie Mood, NP

## 2023-11-22 ENCOUNTER — Ambulatory Visit: Payer: Self-pay | Admitting: Nurse Practitioner

## 2023-11-22 LAB — HEPATITIS B SURFACE ANTIBODY, QUANTITATIVE: Hep B S AB Quant (Post): <5 m[IU]/mL — ABNORMAL LOW (ref >=10)

## 2023-11-22 MED ORDER — METFORMIN HCL ER 750 MG PO TB24: 750.0000 mg | ORAL_TABLET | Freq: Two times a day (BID) | ORAL | 1 refills | Status: AC

## 2023-11-22 MED ORDER — EZETIMIBE 10 MG PO TABS: 10.0000 mg | ORAL_TABLET | Freq: Every day | ORAL | 3 refills | Status: DC

## 2023-11-24 ENCOUNTER — Encounter: Payer: Self-pay | Admitting: Nurse Practitioner

## 2023-11-24 ENCOUNTER — Ambulatory Visit: Admitting: Nurse Practitioner

## 2023-11-24 ENCOUNTER — Other Ambulatory Visit: Payer: Self-pay | Admitting: Nurse Practitioner

## 2023-11-24 VITALS — BP 128/74 | HR 92 | Temp 98.7°F | Ht 69.0 in | Wt 225.4 lb

## 2023-11-24 DIAGNOSIS — Z1231 Encounter for screening mammogram for malignant neoplasm of breast: Secondary | ICD-10-CM

## 2023-11-24 DIAGNOSIS — L539 Erythematous condition, unspecified: Secondary | ICD-10-CM

## 2023-11-24 DIAGNOSIS — T50Z95A Adverse effect of other vaccines and biological substances, initial encounter: Secondary | ICD-10-CM

## 2023-11-24 DIAGNOSIS — H35039 Hypertensive retinopathy, unspecified eye: Secondary | ICD-10-CM | POA: Insufficient documentation

## 2023-11-24 DIAGNOSIS — T50A95A Adverse effect of other bacterial vaccines, initial encounter: Secondary | ICD-10-CM

## 2023-11-24 HISTORY — DX: Adverse effect of other vaccines and biological substances, initial encounter: T50.Z95A

## 2023-11-24 MED ORDER — CEPHALEXIN 500 MG PO CAPS: 500.0000 mg | ORAL_CAPSULE | Freq: Three times a day (TID) | ORAL | 0 refills | Status: DC

## 2023-11-24 NOTE — Telephone Encounter (Signed)
 Pt scheduled with PCP for injection site assessment 11/24/23.

## 2023-11-24 NOTE — Patient Instructions (Addendum)
 Start oral antibiotics if no improvement in 2days. Start benadryl  25mg  BID, apply cold compress every 2hrs-10-97mins at a time, and elevate arm as much as possible. Schedule another appointment if no improvement with completion of oral antibiotics.  Cellulitis, Adult  Cellulitis is a skin infection. The infected area is often warm, red, swollen, and sore. It occurs most often on the legs, feet, and toes, but can happen on any part of the body. This condition can be life-threatening without treatment. It is very important to get treated right away. What are the causes? This condition is caused by bacteria. The bacteria enter through a break in the skin, such as: A cut. A burn. A bug bite. An animal bite. An open sore. A crack. What increases the risk? Having a weak body's defense system (immune system). Being older than 51 years old. Having a blood sugar problem (diabetes). Having a long-term liver disease (cirrhosis) or kidney disease. Being very overweight (obese). Having a skin problem, such as: An itchy rash. A rash caused by a fungus. A rash with blisters. Slow movement of blood in the veins (venous stasis). Fluid buildup below the skin (edema). This condition is more likely to occur in people who: Have open cuts, burns, bites, or scrapes on the skin. Have been treated with high-energy rays (radiation). Use IV drugs. What are the signs or symptoms? Skin that: Looks red or purple, or slightly darker than your usual skin color. Has streaks. Has spots. Is swollen. Is sore or painful when you touch it. Is warm. A fever. Chills. Blisters. Tiredness (fatigue). How is this treated? Medicines to treat infections or allergies. Rest. Placing cold or warm cloths on the skin. Staying in the hospital, if the condition is very bad. You may need medicines through an IV. Follow these instructions at home: Medicines Take over-the-counter and prescription medicines only as told  by your doctor. If you were prescribed antibiotics, take them as told by your doctor. Do not stop using them even if you start to feel better. General instructions Drink enough fluid to keep your pee (urine) pale yellow. Do not touch or rub the infected area. Raise (elevate) the infected area above the level of your heart while you are sitting or lying down. Return to your normal activities when your doctor says that it is safe. Place cold or warm cloths on the area as told by your doctor. Keep all follow-up visits. Your doctor will need to make sure that a more serious infection is not developing. Contact a doctor if: You have a fever. You do not start to get better after 1-2 days of treatment. Your bone or joint under the infected area starts to hurt after the skin has healed. Your infection comes back in the same area or another area. Signs of this may include: You have a swollen bump in the area. Your red area gets larger, turns dark in color, or hurts more. You have more fluid coming from the wound. Pus or a bad smell develops in your infected area. You have more pain. You feel sick and have muscle aches and weakness. You develop vomiting or watery poop that will not go away. Get help right away if: You see red streaks coming from the area. You notice the skin turns purple or black and falls off. These symptoms may be an emergency. Get help right away. Call 911. Do not wait to see if the symptoms will go away. Do not drive yourself to the hospital.  This information is not intended to replace advice given to you by your health care provider. Make sure you discuss any questions you have with your health care provider. Document Revised: 11/17/2021 Document Reviewed: 11/17/2021 Elsevier Patient Education  2024 [employer].

## 2023-11-24 NOTE — Progress Notes (Signed)
 Acute Office Visit  Subjective:    Patient ID: Barbara [name], female    DOB: 03-25-1971, 53 y.o.   MRN: [id number]  Chief Complaint  Patient presents with   Arm tenderness     Left arm tender to touch also warm 2/10 pain-exercise and moving arm since injection    HPI Patient is in today for vaccine reaction. Prevnar 20 was administered 3days ago in left deltoid. She reports malaise, chills, injection site redness, swelling, and axilla swollen lymph nodes x 2days. No OVER THE COUNTER med used.  Outpatient Medications Prior to Visit  Medication Sig Note   amLODipine  (NORVASC ) 10 MG tablet Take 1 tablet (10 mg total) by mouth at bedtime.    blood glucose meter kit and supplies Dispense based on patient and insurance preference. Check glucose Daily in AM. ICD10: E11.65    glipiZIDE  (GLUCOTROL  XL) 10 MG 24 hr tablet Take 1 tablet (10 mg total) by mouth daily with breakfast.    losartan  (COZAAR ) 100 MG tablet Take 1 tablet (100 mg total) by mouth daily.    rosuvastatin  (CRESTOR ) 40 MG tablet Take 1 tablet (40 mg total) by mouth every evening.    Semaglutide  (RYBELSUS ) 7 MG TABS Take 1 tablet (7 mg total) by mouth daily.    ezetimibe  (ZETIA ) 10 MG tablet Take 1 tablet (10 mg total) by mouth daily. 11/24/2023: Has not started yet    metFORMIN  (GLUCOPHAGE -XR) 750 MG 24 hr tablet Take 1 tablet (750 mg total) by mouth 2 (two) times daily after a meal. 11/24/2023: Not started yet    No facility-administered medications prior to visit.    Reviewed past medical and social history.  Review of Systems Per HPI     Objective:    Physical Exam Vitals and nursing note reviewed.  Constitutional:      General: She is not in acute distress. Eyes:     Extraocular Movements: Extraocular movements intact.     Conjunctiva/sclera: Conjunctivae normal.     Pupils: Pupils are equal, round, and reactive to light.  Cardiovascular:     Rate and Rhythm: Normal rate.     Pulses: Normal pulses.   Pulmonary:     Effort: Pulmonary effort is normal.  Musculoskeletal:     Left shoulder: Normal.     Left upper arm: Normal.     Left elbow: Normal.     Left forearm: Normal.     Left wrist: Normal.     Left hand: Normal.       Arms:     Cervical back: Normal range of motion and neck supple.     Comments: Used skin marker in trace area of erythema and induration.  Lymphadenopathy:     Cervical: No cervical adenopathy.     Upper Body:     Left upper body: Axillary adenopathy present. No supraclavicular or pectoral adenopathy.  Skin:    Findings: Rash present.  Neurological:     Mental Status: She is alert and oriented to person, place, and time.    BP 128/74 (BP Location: Right Arm, Patient Position: Sitting, Cuff Size: Large)   Pulse 92   Temp 98.7 F (37.1 C) (Oral)   Ht 5' 9 (1.753 m)   Wt 225 lb 6.4 oz (102.2 kg)   LMP 09/16/2016 (Approximate)   SpO2 98%   BMI 33.29 kg/m    No results found for any visits on 11/24/23.     Assessment & Plan:   Problem List  Items Addressed This Visit     Vaccine reaction, initial encounter - Primary   Relevant Medications   cephALEXin  (KEFLEX ) 500 MG capsule   Meds ordered this encounter  Medications   cephALEXin  (KEFLEX ) 500 MG capsule    Sig: Take 1 capsule (500 mg total) by mouth 3 (three) times daily.    Dispense:  21 capsule    Refill:  0    Supervising Provider:   BERNETA FALLOW ALFRED [[id number]]   Start oral antibiotics if no improvement in 2days. Start benadryl  25mg  BID, apply cold compress every 2hrs-10-77mins at a time, and elevate arm as much as possible. Use benadryl  25mg  once a day if daytime somnolence. Schedule another appointment if no improvement with completion of oral antibiotics.  Return if symptoms worsen or fail to improve.    Roselie Mood, NP

## 2023-11-24 NOTE — Telephone Encounter (Signed)
 LVM for pt to cb to schedule appt with CN. Available slot this afternoon and tomorrow morning, as of right now.

## 2023-11-25 ENCOUNTER — Encounter: Admitting: Nurse Practitioner

## 2023-11-30 NOTE — Progress Notes (Unsigned)
   I, Claretha Schimke am a scribe for Dr. Artist Lloyd, MD.  Barbara [name] is a 53 y.o. female who presents to [facility] at [facility] today for bilat knee pain.  This is a chronic ongoing issue for years.  She has had steroid injections in the past.  She has been working on quad strength and weight loss but would like a little more advice.  She denies any radiating pain weakness or numbness distally.  Knee swelling: yes- intermittently Mechanical symptoms: Aggravates: increased activity Treatments tried: Weight loss and home exercise  Dx imaging: 10/04/19 R & L knee XR  Pertinent review of systems: No fevers or chills  Relevant historical information: Moderately controlled diabetes.   Exam:  Ht 5' 9 (1.753 m)   Wt 229 lb 9.6 oz (104.1 kg)   LMP 09/16/2016 (Approximate)   BMI 33.91 kg/m  General: Well Developed, well nourished, and in no acute distress.   MSK: Bilateral knees mild effusion normal-appearing otherwise normal motion with crepitation.  Intact strength.    Lab and Radiology Results  X-ray images bilateral knees obtained today personally and independently interpreted. 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  No acute fractures. Await formal radiology review   Assessment and Plan: 53 y.o. female with bilateral chronic knee pain predominantly due to DJD.  She would like to avoid intensive medication if possible.  Plan for quad strengthening and weight loss.  Recommend Tylenol  and Voltaren  gel as well.  Work on authorization for ironic acid injections which potentially would work quite well for Barbara [name].  She will keep us  updated how she feels and we can get her authorized for gel shots in the near future if needed.   PDMP not reviewed this encounter. Orders Placed This Encounter  Procedures   DG Knee AP/LAT W/Sunrise Right    Standing Status:   Future    Number of Occurrences:   1    Expiration Date:   11/30/2024     Reason for Exam (SYMPTOM  OR DIAGNOSIS REQUIRED):   knee pain    Is patient pregnant?:   No    Preferred imaging location?:   [facility]   DG Knee AP/LAT W/Sunrise Left    Standing Status:   Future    Number of Occurrences:   1    Expiration Date:   11/30/2024    Reason for Exam (SYMPTOM  OR DIAGNOSIS REQUIRED):   knee pain    Is patient pregnant?:   No    Preferred imaging location?:   [facility]   No orders of the defined types were placed in this encounter.    Discussed warning signs or symptoms. Please see discharge instructions. Patient expresses understanding.   The above documentation has been reviewed and is accurate and complete Artist Lloyd, M.D.

## 2023-12-01 ENCOUNTER — Ambulatory Visit

## 2023-12-01 ENCOUNTER — Telehealth: Payer: Self-pay

## 2023-12-01 ENCOUNTER — Ambulatory Visit (INDEPENDENT_AMBULATORY_CARE_PROVIDER_SITE_OTHER): Admitting: Family Medicine

## 2023-12-01 ENCOUNTER — Other Ambulatory Visit: Payer: Self-pay

## 2023-12-01 VITALS — Ht 69.0 in | Wt 229.6 lb

## 2023-12-01 DIAGNOSIS — M170 Bilateral primary osteoarthritis of knee: Secondary | ICD-10-CM | POA: Diagnosis not present

## 2023-12-01 DIAGNOSIS — M25562 Pain in left knee: Secondary | ICD-10-CM | POA: Diagnosis not present

## 2023-12-01 DIAGNOSIS — M25561 Pain in right knee: Secondary | ICD-10-CM | POA: Diagnosis not present

## 2023-12-01 DIAGNOSIS — G8929 Other chronic pain: Secondary | ICD-10-CM | POA: Diagnosis not present

## 2023-12-01 NOTE — Patient Instructions (Signed)
 Thank you for coming in today.   We will work to authorize gel shots. Once we get approval from your insurance company, we will call to schedule you.  Tylenol   Please use Voltaren  gel (Generic Diclofenac  Gel) up to 4x daily for pain as needed.  This is available over-the-counter as both the name brand Voltaren  gel and the generic diclofenac  gel.   Work on the exercises  Try doing water aerobics and cycling at the gym

## 2023-12-01 NOTE — Telephone Encounter (Signed)
 Please auth gel shots, BILAT knees

## 2023-12-01 NOTE — Telephone Encounter (Signed)
 Ran for glesyn bilateral knee Case# [id number]

## 2023-12-09 NOTE — Telephone Encounter (Signed)
 Can you schedule patient when medication is stocked thank you   Gelsyn authorized for bilateral knee Coinsurance 90% No copay Deductible $3300 has met $2576.10  OOP MAX $5000 has met $2576.10 NO PRE CERT REQUIRED Once OOP has been met coverage goes to 100% Reference # [id number]

## 2023-12-12 ENCOUNTER — Ambulatory Visit: Payer: Self-pay | Admitting: Family Medicine

## 2023-12-12 NOTE — Progress Notes (Signed)
Right knee x-ray shows medium to severe arthritis.

## 2023-12-12 NOTE — Progress Notes (Signed)
Left knee x-ray shows medium to severe arthritis.

## 2023-12-13 NOTE — Telephone Encounter (Signed)
 Medication ordered

## 2023-12-14 NOTE — Telephone Encounter (Signed)
 Left message for patient to call back to schedule. Medication is in stock.

## 2024-01-22 ENCOUNTER — Other Ambulatory Visit: Payer: Self-pay | Admitting: Nurse Practitioner

## 2024-01-22 DIAGNOSIS — I10 Essential (primary) hypertension: Secondary | ICD-10-CM

## 2024-01-22 DIAGNOSIS — E1169 Type 2 diabetes mellitus with other specified complication: Secondary | ICD-10-CM

## 2024-02-05 ENCOUNTER — Telehealth: Admitting: Family

## 2024-02-05 ENCOUNTER — Encounter: Payer: Self-pay | Admitting: Family

## 2024-02-05 DIAGNOSIS — R1032 Left lower quadrant pain: Secondary | ICD-10-CM | POA: Diagnosis not present

## 2024-02-05 DIAGNOSIS — R110 Nausea: Secondary | ICD-10-CM | POA: Diagnosis not present

## 2024-02-05 NOTE — Progress Notes (Signed)
 Virtual Visit Consent   Barbara [name], you are scheduled for a virtual visit with a [facility] provider today. Just as with appointments in the office, your consent must be obtained to participate. Your consent will be active for this visit and any virtual visit you may have with one of our providers in the next 365 days. If you have a MyChart account, a copy of this consent can be sent to you electronically.  As this is a virtual visit, video technology does not allow for your provider to perform a traditional examination. This may limit your provider's ability to fully assess your condition. If your provider identifies any concerns that need to be evaluated in person or the need to arrange testing (such as labs, EKG, etc.), we will make arrangements to do so. Although advances in technology are sophisticated, we cannot ensure that it will always work on either your end or our end. If the connection with a video visit is poor, the visit may have to be switched to a telephone visit. With either a video or telephone visit, we are not always able to ensure that we have a secure connection.  By engaging in this virtual visit, you consent to the provision of healthcare and authorize for your insurance to be billed (if applicable) for the services provided during this visit. Depending on your insurance coverage, you may receive a charge related to this service.  I need to obtain your verbal consent now. Are you willing to proceed with your visit today? Barbara [name] has provided verbal consent on 02/05/2024 for a virtual visit (video or telephone). [name], FNP  Date: 02/05/2024 9:25 AM   Virtual Visit via Video Note   I, [name], connected with  Barbara [name]  ([id number], 08/05/1970) on 02/05/24 at  9:00 AM EST by a video-enabled telemedicine application and verified that I am speaking with the correct person using two identifiers.  Location: Patient: Virtual Visit  Location Patient: Home Provider: Virtual Visit Location Provider: Home Office   I discussed the limitations of evaluation and management by telemedicine and the availability of in person appointments. The patient expressed understanding and agreed to proceed.    History of Present Illness: Barbara [name] is a 53 y.o. who identifies as a female who was assigned female at birth, and is being seen today for abdominal pain for four days with nausea.  HPI: Abdominal Pain This is a new problem. The current episode started in the past 7 days. The problem occurs intermittently. The problem has been waxing and waning. The pain is located in the LLQ. The pain is mild. The quality of the pain is aching. Associated symptoms include nausea. Pertinent negatives include no belching, constipation, fever or flatus. The pain is aggravated by movement.    Problems:  Patient Active Problem List   Diagnosis Date Noted   Hypertensive retinopathy 11/24/2023   Vaccine reaction, initial encounter 11/24/2023   Neuropathy 11/21/2023   Right foot pain 01/04/2023   Adjustment disorder with mixed anxiety and depressed mood 10/26/2022   Primary osteoarthritis of both knees 10/13/2021   DM (diabetes mellitus) (HCC) 04/06/2018   S/P hysterectomy with oophorectomy 09/28/2016   Bilateral chronic knee pain 12/02/2015   Insomnia 02/18/2014   History of gestational diabetes 10/26/2012   Hyperlipidemia associated with type 2 diabetes mellitus (HCC) 10/23/2009   HYPERTENSION, BENIGN SYSTEMIC 06/02/2006    Allergies: No Known Allergies Medications:  Current Outpatient Medications:    amLODipine  (  NORVASC ) 10 MG tablet, TAKE 1 TABLET BY MOUTH EVERYDAY AT BEDTIME, Disp: 90 tablet, Rfl: 1   blood glucose meter kit and supplies, Dispense based on patient and insurance preference. Check glucose Daily in AM. ICD10: E11.65, Disp: 1 each, Rfl: 0   cephALEXin  (KEFLEX ) 500 MG capsule, Take 1 capsule (500 mg total) by mouth 3  (three) times daily., Disp: 21 capsule, Rfl: 0   ezetimibe  (ZETIA ) 10 MG tablet, Take 1 tablet (10 mg total) by mouth daily., Disp: 90 tablet, Rfl: 3   glipiZIDE  (GLUCOTROL  XL) 10 MG 24 hr tablet, Take 1 tablet (10 mg total) by mouth daily with breakfast., Disp: 90 tablet, Rfl: 1   losartan  (COZAAR ) 100 MG tablet, Take 1 tablet (100 mg total) by mouth daily., Disp: 90 tablet, Rfl: 3   metFORMIN  (GLUCOPHAGE -XR) 750 MG 24 hr tablet, Take 1 tablet (750 mg total) by mouth 2 (two) times daily after a meal., Disp: 180 tablet, Rfl: 1   rosuvastatin  (CRESTOR ) 40 MG tablet, Take 1 tablet (40 mg total) by mouth every evening., Disp: 90 tablet, Rfl: 1   RYBELSUS  7 MG TABS, TAKE 1 TABLET (7 MG TOTAL) BY MOUTH DAILY, Disp: 90 tablet, Rfl: 1  Observations/Objective: Patient is well-developed, well-nourished in no acute distress.  Resting comfortably  at home.  Head is normocephalic, atraumatic.  No labored breathing.  Speech is clear and coherent with logical content.  Patient is alert and oriented at baseline.    Assessment and Plan: 1. Left lower quadrant abdominal pain (Primary)  2. Nausea  Given current symptoms advised she needs to be seen in person to rule out a more serious infection. Worrisome for diverticulitis, however, no history of this. If symptoms worsen or fevers go to ED.   Follow Up Instructions: I discussed the assessment and treatment plan with the patient. The patient was provided an opportunity to ask questions and all were answered. The patient agreed with the plan and demonstrated an understanding of the instructions.  A copy of instructions were sent to the patient via MyChart unless otherwise noted below.     The patient was advised to call back or seek an in-person evaluation if the symptoms worsen or if the condition fails to improve as anticipated.    [name], FNP

## 2024-02-06 ENCOUNTER — Encounter (HOSPITAL_BASED_OUTPATIENT_CLINIC_OR_DEPARTMENT_OTHER): Payer: Self-pay

## 2024-02-06 ENCOUNTER — Ambulatory Visit: Admitting: Nurse Practitioner

## 2024-02-06 ENCOUNTER — Emergency Department (HOSPITAL_BASED_OUTPATIENT_CLINIC_OR_DEPARTMENT_OTHER)
Admission: EM | Admit: 2024-02-06 | Discharge: 2024-02-06 | Disposition: A | Discharge status: Home / Self Care | Attending: Emergency Medicine | Admitting: Emergency Medicine

## 2024-02-06 ENCOUNTER — Emergency Department (HOSPITAL_BASED_OUTPATIENT_CLINIC_OR_DEPARTMENT_OTHER)

## 2024-02-06 DIAGNOSIS — N132 Hydronephrosis with renal and ureteral calculous obstruction: Secondary | ICD-10-CM | POA: Diagnosis not present

## 2024-02-06 DIAGNOSIS — E119 Type 2 diabetes mellitus without complications: Secondary | ICD-10-CM | POA: Diagnosis not present

## 2024-02-06 DIAGNOSIS — Z79899 Other long term (current) drug therapy: Secondary | ICD-10-CM | POA: Diagnosis not present

## 2024-02-06 DIAGNOSIS — I10 Essential (primary) hypertension: Secondary | ICD-10-CM | POA: Insufficient documentation

## 2024-02-06 DIAGNOSIS — N200 Calculus of kidney: Secondary | ICD-10-CM

## 2024-02-06 DIAGNOSIS — R1032 Left lower quadrant pain: Secondary | ICD-10-CM | POA: Diagnosis present

## 2024-02-06 LAB — COMPREHENSIVE METABOLIC PANEL WITH GFR
ALT: 30 U/L (ref 0–44)
AST: 25 U/L (ref 15–41)
Albumin: 4.5 g/dL (ref 3.5–5.0)
Alkaline Phosphatase: 101 U/L (ref 38–126)
Anion gap: 11 (ref 5–15)
BUN: 16 mg/dL (ref 6–20)
CO2: 26 mmol/L (ref 22–32)
Calcium: 10.1 mg/dL (ref 8.9–10.3)
Chloride: 101 mmol/L (ref 98–111)
Creatinine, Ser: 1.1 mg/dL — ABNORMAL HIGH (ref 0.44–1.00)
GFR, Estimated: >60 mL/min (ref >60)
Glucose, Bld: 245 mg/dL — ABNORMAL HIGH (ref 70–99)
Potassium: 4.1 mmol/L (ref 3.5–5.1)
Sodium: 138 mmol/L (ref 135–145)
Total Bilirubin: 0.4 mg/dL (ref 0.0–1.2)
Total Protein: 8 g/dL (ref 6.5–8.1)

## 2024-02-06 LAB — URINALYSIS, ROUTINE W REFLEX MICROSCOPIC
Bacteria, UA: NONE SEEN
Bilirubin Urine: NEGATIVE
Glucose, UA: NEGATIVE mg/dL
Ketones, ur: NEGATIVE mg/dL
Nitrite: NEGATIVE
RBC / HPF: >50 RBC/hpf (ref 0–5)
Specific Gravity, Urine: 1.02 (ref 1.005–1.030)
pH: 5.5 (ref 5.0–8.0)

## 2024-02-06 LAB — CBC WITH DIFFERENTIAL/PLATELET
Abs Immature Granulocytes: 0.02 K/uL (ref 0.00–0.07)
Basophils Absolute: 0 K/uL (ref 0.0–0.1)
Basophils Relative: 0 %
Eosinophils Absolute: 0.1 K/uL (ref 0.0–0.5)
Eosinophils Relative: 1 %
HCT: 36.9 % (ref 36.0–46.0)
Hemoglobin: 12.6 g/dL (ref 12.0–15.0)
Immature Granulocytes: 0 %
Lymphocytes Relative: 23 %
Lymphs Abs: 1.7 K/uL (ref 0.7–4.0)
MCH: 30.1 pg (ref 26.0–34.0)
MCHC: 34.1 g/dL (ref 30.0–36.0)
MCV: 88.1 fL (ref 80.0–100.0)
Monocytes Absolute: 0.5 K/uL (ref 0.1–1.0)
Monocytes Relative: 7 %
Neutro Abs: 4.8 K/uL (ref 1.7–7.7)
Neutrophils Relative %: 69 %
Platelets: 330 K/uL (ref 150–400)
RBC: 4.19 MIL/uL (ref 3.87–5.11)
RDW: 13.5 % (ref 11.5–15.5)
WBC: 7.1 K/uL (ref 4.0–10.5)
nRBC: 0 % (ref 0.0–0.2)

## 2024-02-06 MED ORDER — OXYCODONE-ACETAMINOPHEN 5-325 MG PO TABS: 1.0000 | ORAL_TABLET | Freq: Four times a day (QID) | ORAL | 0 refills | Status: DC | PRN

## 2024-02-06 MED ORDER — SODIUM CHLORIDE 0.9 % IV BOLUS
1000.0000 mL | Freq: Once | INTRAVENOUS | Status: AC
  Administered 2024-02-06: 1000 mL via INTRAVENOUS

## 2024-02-06 MED ORDER — KETOROLAC TROMETHAMINE 15 MG/ML IJ SOLN
15.0000 mg | Freq: Once | INTRAMUSCULAR | Status: AC
  Administered 2024-02-06: 15 mg via INTRAVENOUS
  Filled 2024-02-06: qty 1

## 2024-02-06 MED ORDER — TAMSULOSIN HCL 0.4 MG PO CAPS: 0.4000 mg | ORAL_CAPSULE | Freq: Every day | ORAL | 0 refills | Status: DC

## 2024-02-06 MED ORDER — ONDANSETRON HCL 4 MG/2ML IJ SOLN
4.0000 mg | Freq: Once | INTRAMUSCULAR | Status: AC
  Administered 2024-02-06: 4 mg via INTRAVENOUS
  Filled 2024-02-06: qty 2

## 2024-02-06 MED ORDER — ONDANSETRON 4 MG PO TBDP: 4.0000 mg | ORAL_TABLET | Freq: Three times a day (TID) | ORAL | 0 refills | Status: DC | PRN

## 2024-02-06 MED ORDER — MORPHINE SULFATE (PF) 4 MG/ML IV SOLN
4.0000 mg | Freq: Once | INTRAVENOUS | Status: AC
  Administered 2024-02-06: 4 mg via INTRAVENOUS
  Filled 2024-02-06: qty 1

## 2024-02-06 NOTE — ED Provider Notes (Signed)
 [place] EMERGENCY DEPARTMENT AT [facility] Provider Note   CSN: [id number] Arrival date & time: 02/06/24  0300     Patient presents with: Flank Pain   Barbara [name] is a 53 y.o. female.   HPI     This is a 53 year old female who presents with left lower quadrant pain.  Patient reports of 4 to 5-day history of waxing and waning left lower quadrant pain.  She has had associated nausea and vomiting at times.  Still in nature.  Does report some increased urination.  No hematuria.  No change in bowels.  No noted fevers.  Patient denies any history of kidney stones or diverticulitis.  Prior to Admission medications   Medication Sig Start Date End Date Taking? Authorizing Provider  ondansetron  (ZOFRAN -ODT) 4 MG disintegrating tablet Take 1 tablet (4 mg total) by mouth every 8 (eight) hours as needed. 02/06/24  Yes Elie Leppo, Charmaine FALCON, MD  oxyCODONE-acetaminophen  (PERCOCET/ROXICET) 5-325 MG tablet Take 1 tablet by mouth every 6 (six) hours as needed for severe pain (pain score 7-10). 02/06/24  Yes Sevyn Markham, Charmaine FALCON, MD  tamsulosin (FLOMAX) 0.4 MG CAPS capsule Take 1 capsule (0.4 mg total) by mouth daily. 02/06/24  Yes Saddie Sandeen, Charmaine FALCON, MD  amLODipine  (NORVASC ) 10 MG tablet TAKE 1 TABLET BY MOUTH EVERYDAY AT BEDTIME 01/23/24   Nche, Roselie Rockford, NP  blood glucose meter kit and supplies Dispense based on patient and insurance preference. Check glucose Daily in AM. ICD10: E11.65 11/16/21   Nche, Roselie Rockford, NP  cephALEXin  (KEFLEX ) 500 MG capsule Take 1 capsule (500 mg total) by mouth 3 (three) times daily. 11/24/23   Nche, Roselie Rockford, NP  ezetimibe  (ZETIA ) 10 MG tablet Take 1 tablet (10 mg total) by mouth daily. 11/22/23   Nche, Roselie Rockford, NP  glipiZIDE  (GLUCOTROL  XL) 10 MG 24 hr tablet Take 1 tablet (10 mg total) by mouth daily with breakfast. 08/17/23   Nche, Roselie Rockford, NP  losartan  (COZAAR ) 100 MG tablet Take 1 tablet (100 mg total) by mouth daily. 06/08/23   Nche,  Roselie Rockford, NP  metFORMIN  (GLUCOPHAGE -XR) 750 MG 24 hr tablet Take 1 tablet (750 mg total) by mouth 2 (two) times daily after a meal. 11/22/23   Nche, Roselie Rockford, NP  rosuvastatin  (CRESTOR ) 40 MG tablet Take 1 tablet (40 mg total) by mouth every evening. 08/17/23   Nche, Roselie Rockford, NP  RYBELSUS  7 MG TABS TAKE 1 TABLET (7 MG TOTAL) BY MOUTH DAILY 01/23/24   Nche, Roselie Rockford, NP    Allergies: Patient has no known allergies.    Review of Systems  Constitutional:  Negative for fever.  Respiratory:  Negative for shortness of breath.   Cardiovascular:  Negative for chest pain.  Gastrointestinal:  Positive for abdominal pain and nausea.  Genitourinary:  Negative for dysuria and flank pain.  All other systems reviewed and are negative.   Updated Vital Signs BP (!) 181/99 (BP Location: Right Arm)   Pulse 80   Temp 99.2 F (37.3 C) (Oral)   Resp 17   Ht 1.753 m (5' 9)   Wt 102.1 kg   LMP 09/16/2016 (Approximate)   SpO2 95%   BMI 33.23 kg/m   Physical Exam Vitals and nursing note reviewed.  Constitutional:      Appearance: She is well-developed. She is obese. She is not ill-appearing.  HENT:     Head: Normocephalic and atraumatic.  Eyes:     Pupils: Pupils are equal, round, and reactive  to light.  Cardiovascular:     Rate and Rhythm: Normal rate and regular rhythm.     Heart sounds: Normal heart sounds.  Pulmonary:     Effort: Pulmonary effort is normal. No respiratory distress.     Breath sounds: No wheezing.  Abdominal:     General: Bowel sounds are normal.     Palpations: Abdomen is soft.     Tenderness: There is abdominal tenderness. There is no right CVA tenderness, left CVA tenderness, guarding or rebound.     Comments: Left mid abdominal tenderness to palpation, no rebound or guarding  Musculoskeletal:     Cervical back: Neck supple.  Skin:    General: Skin is warm and dry.  Neurological:     Mental Status: She is alert and oriented to person, place, and  time.  Psychiatric:        Mood and Affect: Mood normal.     (all labs ordered are listed, but only abnormal results are displayed) Labs Reviewed  COMPREHENSIVE METABOLIC PANEL WITH GFR - Abnormal; Notable for the following components:      Result Value   Glucose, Bld 245 (*)    Creatinine, Ser 1.10 (*)    All other components within normal limits  URINALYSIS, ROUTINE W REFLEX MICROSCOPIC - Abnormal; Notable for the following components:   Color, Urine ORANGE (*)    APPearance HAZY (*)    Hgb urine dipstick LARGE (*)    Protein, ur TRACE (*)    Leukocytes,Ua TRACE (*)    All other components within normal limits  CBC WITH DIFFERENTIAL/PLATELET    EKG: None  Radiology: CT Renal Stone Study Result Date: 02/06/2024 EXAM: CT ABDOMEN AND PELVIS WITHOUT CONTRAST 02/06/2024 04:07:00 AM TECHNIQUE: CT of the abdomen and pelvis was performed without the administration of intravenous contrast. Multiplanar reformatted images are provided for review. Automated exposure control, iterative reconstruction, and/or weight-based adjustment of the mA/kV was utilized to reduce the radiation dose to as low as reasonably achievable. COMPARISON: CT of the abdomen and pelvis dated 06/21/2016. CLINICAL HISTORY: Abdominal/flank pain, stone suspected. FINDINGS: LOWER CHEST: Mild atelectasis present independently within the lower lobes bilaterally. LIVER: The liver is unremarkable. GALLBLADDER AND BILE DUCTS: Status post Cholecystectomy. No biliary ductal dilatation. SPLEEN: No acute abnormality. PANCREAS: No acute abnormality. ADRENAL GLANDS: No acute abnormality. KIDNEYS, URETERS AND BLADDER: There is a 2 mm nonobstructive calculus within the lower pole of the left kidney. There is a 3 mm nonobstructive calculus within the superior pole of the right kidney. There is a 4 mm calculus in the left lower pelvis which appears to be in the distal ureter just proximal to the Ureterovesical Junction (seen on image 77 of  series 2). There is moderate left Hydroureteronephrosis present. The right ureter is normal in caliber and free of calculi. No perinephric or periureteral stranding. Urinary bladder is unremarkable. GI AND BOWEL: Stomach demonstrates no acute abnormality. There is no bowel obstruction. PERITONEUM AND RETROPERITONEUM: No ascites. No free air. VASCULATURE: Aorta is normal in caliber. LYMPH NODES: No lymphadenopathy. REPRODUCTIVE ORGANS: Status post Hysterectomy and bilateral Salpingo-oophorectomy. BONES AND SOFT TISSUES: No acute osseous abnormality. No focal soft tissue abnormality. IMPRESSION: 1. 4 mm distal left ureteral calculus just proximal to the ureterovesical junction with associated moderate left hydroureteronephrosis. 2. 2 mm nonobstructive calculus in the lower pole of the left kidney. 3. 3 mm nonobstructive calculus in the superior pole of the right kidney. 4. Mild dependent atelectasis in the lower lobes bilaterally. Electronically  signed by: Evalene Coho MD 02/06/2024 04:48 AM EST RP Workstation: [id number]     Procedures   Medications Ordered in the ED  morphine (PF) 4 MG/ML injection 4 mg (4 mg Intravenous Given 02/06/24 0424)  ondansetron  (ZOFRAN ) injection 4 mg (4 mg Intravenous Given 02/06/24 0424)  sodium chloride  0.9 % bolus 1,000 mL (0 mLs Intravenous Stopped 02/06/24 0524)  ketorolac  (TORADOL ) 15 MG/ML injection 15 mg (15 mg Intravenous Given 02/06/24 0515)                                    Medical Decision Making Amount and/or Complexity of Data Reviewed Labs: ordered. Radiology: ordered.  Risk Prescription drug management.   This patient presents to the ED for concern of abdominal pain, this involves an extensive number of treatment options, and is a complaint that carries with it a high risk of complications and morbidity.  I considered the following differential and admission for this acute, potentially life threatening condition.  The differential diagnosis  includes diverticulitis, colitis, kidney stone, UTI, ovarian pathology  MDM:    This is a 53 year old female who presents with left lower quadrant pain.  Nontoxic and vital signs are reassuring.  No signs of peritonitis.  Labs obtained.  She does have hemoglobin in her urine.  No leukocytosis.  CT stone study confirms a 4 mm distal ureteral stone.  This is likely the culprit for the patient's symptoms.  Patient was given a dose of Toradol  and fluids as well as pain medication.  Discussed with her management and follow-up with urology.  (Labs, imaging, consults)  Labs: I Ordered, and personally interpreted labs.  The pertinent results include: CBC, BMP, urinalysis  Imaging Studies ordered: I ordered imaging studies including CT stone study I independently visualized and interpreted imaging. I agree with the radiologist interpretation  Additional history obtained from chart review.  External records from outside source obtained and reviewed including prior evaluations  Cardiac Monitoring: The patient was maintained on a cardiac monitor.  If on the cardiac monitor, I personally viewed and interpreted the cardiac monitored which showed an underlying rhythm of: Sinus  Reevaluation: After the interventions noted above, I reevaluated the patient and found that they have :improved  Social Determinants of Health:  lives independently  Disposition: Discharge  Co morbidities that complicate the patient evaluation  Past Medical History:  Diagnosis Date   Anxiety    Arthritis    Cyst, ovary, dermoid, left    Depression    Not sure if I am experiencing this currently   Diabetes mellitus without complication (HCC)    History of gestational diabetes    History of Helicobacter pylori infection 09/2005   History of 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 07/ 2017   History of pregnancy induced hypertension    Hyperlipidemia    Hypertension    during pregnancy-no medications   Iron deficiency  anemia    Menorrhagia    Nephrolithiasis    bilateral tiny nonobstructive calculi per CT 06-21-2016   Osteoporosis    Uterine fibroid    Wears glasses      Medicines Meds ordered this encounter  Medications   morphine (PF) 4 MG/ML injection 4 mg   ondansetron  (ZOFRAN ) injection 4 mg   sodium chloride  0.9 % bolus 1,000 mL   ketorolac  (TORADOL ) 15 MG/ML injection 15 mg   oxyCODONE-acetaminophen  (PERCOCET/ROXICET) 5-325 MG tablet    Sig:  Take 1 tablet by mouth every 6 (six) hours as needed for severe pain (pain score 7-10).    Dispense:  15 tablet    Refill:  0   tamsulosin (FLOMAX) 0.4 MG CAPS capsule    Sig: Take 1 capsule (0.4 mg total) by mouth daily.    Dispense:  30 capsule    Refill:  0   ondansetron  (ZOFRAN -ODT) 4 MG disintegrating tablet    Sig: Take 1 tablet (4 mg total) by mouth every 8 (eight) hours as needed.    Dispense:  20 tablet    Refill:  0    I have reviewed the patients home medicines and have made adjustments as needed  Problem List / ED Course: Problem List Items Addressed This Visit   None Visit Diagnoses       Kidney stone    -  Primary   Relevant Medications   morphine (PF) 4 MG/ML injection 4 mg (Completed)   oxyCODONE-acetaminophen  (PERCOCET/ROXICET) 5-325 MG tablet                Final diagnoses:  Kidney stone    ED Discharge Orders          Ordered    oxyCODONE-acetaminophen  (PERCOCET/ROXICET) 5-325 MG tablet  Every 6 hours PRN        02/06/24 0539    tamsulosin (FLOMAX) 0.4 MG CAPS capsule  Daily        02/06/24 0539    ondansetron  (ZOFRAN -ODT) 4 MG disintegrating tablet  Every 8 hours PRN        02/06/24 0539               Bari Charmaine FALCON, MD 02/06/24 [phone]

## 2024-02-06 NOTE — Discharge Instructions (Addendum)
 You were seen today for left lower quadrant pain.  You have a kidney stone.  Make sure that you are staying hydrated.  Take medications as prescribed.  Follow-up with urology if you have any new or worsening symptoms.  If you develop fevers, nausea, vomiting, or intractable pain, these are reasons to be reevaluated.

## 2024-02-06 NOTE — ED Triage Notes (Signed)
 Pt self ambulated to exam 11 c/o N/V and left sided flank pain  8/10 dull in nature x 4 days. Pt states she has had increased urination during same time. No elevating factors noted. VSS NAD PT on room air. A/o x 4

## 2024-02-21 ENCOUNTER — Encounter: Payer: Self-pay | Admitting: Nurse Practitioner

## 2024-02-21 ENCOUNTER — Ambulatory Visit: Admitting: Nurse Practitioner

## 2024-02-21 VITALS — BP 132/80 | HR 84 | Temp 98.6°F | Ht 69.0 in | Wt 229.4 lb

## 2024-02-21 DIAGNOSIS — E785 Hyperlipidemia, unspecified: Secondary | ICD-10-CM

## 2024-02-21 DIAGNOSIS — I10 Essential (primary) hypertension: Secondary | ICD-10-CM | POA: Diagnosis not present

## 2024-02-21 DIAGNOSIS — E1165 Type 2 diabetes mellitus with hyperglycemia: Secondary | ICD-10-CM

## 2024-02-21 DIAGNOSIS — E1169 Type 2 diabetes mellitus with other specified complication: Secondary | ICD-10-CM | POA: Diagnosis not present

## 2024-02-21 DIAGNOSIS — N200 Calculus of kidney: Secondary | ICD-10-CM | POA: Diagnosis not present

## 2024-02-21 DIAGNOSIS — Z7984 Long term (current) use of oral hypoglycemic drugs: Secondary | ICD-10-CM

## 2024-02-21 LAB — HEMOGLOBIN A1C: Hgb A1c MFr Bld: 9.3 % — ABNORMAL HIGH (ref 4.6–6.5)

## 2024-02-21 LAB — RENAL FUNCTION PANEL
Albumin: 4.3 g/dL (ref 3.5–5.2)
BUN: 14 mg/dL (ref 6–23)
CO2: 29 meq/L (ref 19–32)
Calcium: 9.6 mg/dL (ref 8.4–10.5)
Chloride: 101 meq/L (ref 96–112)
Creatinine, Ser: 0.81 mg/dL (ref 0.40–1.20)
GFR: 83.96 mL/min (ref >60.00)
Glucose, Bld: 227 mg/dL — ABNORMAL HIGH (ref 70–99)
Phosphorus: 3.2 mg/dL (ref 2.3–4.6)
Potassium: 4 meq/L (ref 3.5–5.1)
Sodium: 137 meq/L (ref 135–145)

## 2024-02-21 MED ORDER — GLIPIZIDE ER 10 MG PO TB24: 10.0000 mg | ORAL_TABLET | Freq: Every day | ORAL | 1 refills | Status: AC

## 2024-02-21 MED ORDER — LOSARTAN POTASSIUM 100 MG PO TABS: 100.0000 mg | ORAL_TABLET | Freq: Every day | ORAL | 3 refills | Status: AC

## 2024-02-21 MED ORDER — ROSUVASTATIN CALCIUM 40 MG PO TABS: 40.0000 mg | ORAL_TABLET | Freq: Every evening | ORAL | 1 refills | Status: AC

## 2024-02-21 NOTE — Patient Instructions (Signed)
 Go to lab  Nerve Damage (Peripheral Neuropathy): What to Know Peripheral neuropathy happens when there's damage to the peripheral nerves. These nerves send signals between your spinal cord and your arms and legs. You can have damage in one or more nerves. What are the causes? There are many reasons why nerves can get damaged. Damage may be caused by some diseases, such as: Diabetes. This is the most common cause. Autoimmune diseases, such as rheumatoid arthritis or lupus. These happen when your body's defense system (immune system) attacks healthy parts of your body by mistake. Inherited nerve disease. This is passed down in families. Kidney disease. Thyroid  disease. Other causes include: Pressure or stress on a nerve that lasts a long time. Lack of B vitamins from poor diet or drinking too much alcohol. Infections. Some medicines, like chemotherapy used for cancer treatment. Poisonous (toxic) substances, such as lead or mercury. Too little blood flowing to the legs. Sometimes, the cause isn't known. What are the signs or symptoms? Symptoms depend on which nerves are damaged. In your legs, hands, or arms, you may have: Loss of feeling (numbness). Tingling. Burning pain. Very sensitive skin. Weakness. Paralysis. This is when you can't move a part of your body. Clumsiness. Muscle twitching. Loss of balance. You may have trouble walking. Other symptoms can include: Not being able to control when you pee. Feeling dizzy. Problems during sex. How is this diagnosed? Your health care provider will: Ask about your symptoms and medical history. Do a physical exam. Do a neurological exam. They will check your reflexes, how you move, and what you can feel. You may need to have other tests, such as: Blood tests. Nerve tests to check how well your nerves and muscles work. Imaging tests, such as a CT scan or MRI. These are done to rule out other causes. Nerve biopsy. This is when a small  piece of a nerve is removed for testing. Lumbar puncture. A small amount of the fluid that surrounds the brain and spinal cord is taken and checked in a lab. How is this treated? Treatment may include: Treating the cause of the nerve damage. Pain medicines. Other medicines that may help with pain, such as: Medicines that treat seizures. Medicines that treat depression. Pain patches or creams that are put on painful areas of skin. TENS therapy. This uses a device that sends mild electrical pulses to your nerves. This will prevent pain signals from reaching the brain. Massage. Acupuncture. Surgery to relieve pressure on a nerve or to destroy a nerve that's causing pain. This is done only if the other treatments are not helping. You may also need physical therapy to help improve your movement, balance, and muscle strength. You may be told to use a cane or walker if needed. Follow these instructions at home: Medicines Take your medicines only as told. You may need to take steps to help treat or prevent trouble pooping (constipation), such as: Taking medicines to help you poop. Eating foods high in fiber, like beans, whole grains, and fresh fruits and vegetables. Drinking more fluids as told. Ask your provider if it's safe to drive or use machines while taking your medicine. Lifestyle  Do not smoke, vape, or use nicotine or tobacco. Avoid or limit alcohol. Too much alcohol can be harmful to nerves and cause damage. Eat a healthy diet. Safety  Take care to avoid burning or damaging your skin if you have numbness. If you have numbness in your feet: Check your feet each day  for signs of injury or infection. Watch for redness, warmth, and swelling. Wear padded socks and comfortable shoes. These help protect your feet. General instructions If you have diabetes, keep your blood sugar under control. Develop a good support system. Living with nerve damage can cause a lot of stress. Talk with a  mental health therapist or join a support group. Exercise as told. Ask what things are safe for you to do at home. Work with a pain specialist to find the best way to manage your pain. Keep all follow-up visits. Your provider will check if the treatments are working and change them if needed. Where to find support [employer] for Peripheral Neuropathy: budgetmaniac.si Where to find more information To learn more, go to: [employer] Stroke at basicfm.no. Click Search and type peripheral neuropathy. Find the link you need. Contact a health care provider if: Your pain treatments are not working. Your symptoms are getting worse. You have side effects from any of your medicines. You have new symptoms. You're having a hard time coping with your condition. Get help right away if: You have a wound that's not healing. You have new weakness in an arm or leg. You've fallen or you fall often. This information is not intended to replace advice given to you by your health care provider. Make sure you discuss any questions you have with your health care provider. Document Revised: 06/16/2023 Document Reviewed: 06/16/2023 Elsevier Patient Education  2025 [employer].

## 2024-02-21 NOTE — Progress Notes (Signed)
 Established Patient Visit  Patient: Barbara [name]   DOB: [birth year]   53 y.o. Female  MRN: [id number] Visit Date: 02/21/2024  Subjective:    Chief Complaint  Patient presents with   Follow-up    3 month follow up and recent ED visit  Refill requested for Losartan     HPI Bilateral nephrolithiasis CT completed 02/2024:There is a 2 mm nonobstructive calculus within the lower pole of the leftkidney. There is a 3 mm nonobstructive calculus within the superior pole of the right kidney. There is a 4 mm calculus in the left lower pelvis which appearsto be in the distal ureter just proximal to the Ureterovesical Junction (seen on image 77 of series 2). There is moderate left Hydroureteronephrosis present.The right ureter is normal in caliber and free of calculi. No perinephric or periureteral stranding. Urinary bladder is unremarkable.  Had ED visit: pain management and flomax prescribed Reports resolved flank pain and no hematuria. She was unable to strain stone Entered urology referral Advised to maintain adequate oral hydration, avoid sugary drinks, black tea and artificial sweeteners.  HYPERTENSION, BENIGN SYSTEMIC Improved BP control with amlodipine  10mg , and losartan  100mg  No adverse effects Positive hypertensive retinopathy-last end exam 2025 BP Readings from Last 3 Encounters:  02/21/24 132/80  02/06/24 (!) 142/82  11/24/23 128/74    Maintain med doses F/up in 36month  DM (diabetes mellitus) (HCC) Negative UACr  No Dm retinopathy Positive neuropathy (normal Tsh, B12, and folic acid ). BP at goal LDL not at goal, current use of crestor  and zetia  No adverse effects with glipizide , metformin , and rybelsus   Repeat hgbA1c and BMP Maintain med doses F/up in 36months  Reviewed medical, surgical, and social history today  Medications: Outpatient Medications Prior to Visit  Medication Sig   amLODipine  (NORVASC ) 10 MG tablet TAKE 1 TABLET BY MOUTH EVERYDAY AT  BEDTIME   blood glucose meter kit and supplies Dispense based on patient and insurance preference. Check glucose Daily in AM. ICD10: E11.65   glipiZIDE  (GLUCOTROL  XL) 10 MG 24 hr tablet Take 1 tablet (10 mg total) by mouth daily with breakfast.   losartan  (COZAAR ) 100 MG tablet Take 1 tablet (100 mg total) by mouth daily.   metFORMIN  (GLUCOPHAGE -XR) 750 MG 24 hr tablet Take 1 tablet (750 mg total) by mouth 2 (two) times daily after a meal.   ondansetron  (ZOFRAN -ODT) 4 MG disintegrating tablet Take 1 tablet (4 mg total) by mouth every 8 (eight) hours as needed.   oxyCODONE-acetaminophen  (PERCOCET/ROXICET) 5-325 MG tablet Take 1 tablet by mouth every 6 (six) hours as needed for severe pain (pain score 7-10).   rosuvastatin  (CRESTOR ) 40 MG tablet Take 1 tablet (40 mg total) by mouth every evening.   RYBELSUS  7 MG TABS TAKE 1 TABLET (7 MG TOTAL) BY MOUTH DAILY   tamsulosin (FLOMAX) 0.4 MG CAPS capsule Take 1 capsule (0.4 mg total) by mouth daily.   [DISCONTINUED] cephALEXin  (KEFLEX ) 500 MG capsule Take 1 capsule (500 mg total) by mouth 3 (three) times daily.   [DISCONTINUED] ezetimibe  (ZETIA ) 10 MG tablet Take 1 tablet (10 mg total) by mouth daily.   No facility-administered medications prior to visit.   Reviewed past medical and social history.   ROS per HPI above  Last metabolic panel Lab Results  Component Value Date   GLUCOSE 245 (H) 02/06/2024   NA 138 02/06/2024   K 4.1 02/06/2024   CL 101 02/06/2024  CO2 26 02/06/2024   BUN 16 02/06/2024   CREATININE 1.10 (H) 02/06/2024   GFRNONAA >60 02/06/2024   CALCIUM  10.1 02/06/2024   PHOS 3.4 04/18/2023   PROT 8.0 02/06/2024   ALBUMIN 4.5 02/06/2024   BILITOT 0.4 02/06/2024   ALKPHOS 101 02/06/2024   AST 25 02/06/2024   ALT 30 02/06/2024   ANIONGAP 11 02/06/2024   Last lipids Lab Results  Component Value Date   CHOL 212 (H) 11/21/2023   HDL 75.10 11/21/2023   LDLCALC 121 (H) 11/21/2023   LDLDIRECT 113.0 04/18/2023   TRIG 78.0  11/21/2023   CHOLHDL 3 11/21/2023   Last hemoglobin A1c Lab Results  Component Value Date   HGBA1C 8.1 (H) 11/21/2023   Last thyroid  functions Lab Results  Component Value Date   TSH 1.77 11/21/2023   Last vitamin D No results found for: 25OHVITD2, 25OHVITD3, VD25OH Last vitamin B12 and Folate Lab Results  Component Value Date   VITAMINB12 476 11/21/2023   FOLATE 18.9 11/21/2023   Objective:  BP 132/80 (BP Location: Left Arm, Patient Position: Sitting, Cuff Size: Large)   Pulse 84   Temp 98.6 F (37 C) (Oral)   Ht 5' 9 (1.753 m)   Wt 229 lb 6.4 oz (104.1 kg)   LMP 09/16/2016 (Approximate)   SpO2 97%   BMI 33.88 kg/m   Physical Exam Vitals and nursing note reviewed.  Cardiovascular:     Rate and Rhythm: Normal rate.     Pulses: Normal pulses.  Pulmonary:     Effort: Pulmonary effort is normal.  Musculoskeletal:     Right lower leg: No edema.     Left lower leg: No edema.  Neurological:     Mental Status: She is alert and oriented to person, place, and time.  Psychiatric:        Mood and Affect: Mood normal.        Behavior: Behavior normal.        Thought Content: Thought content normal.     No results found for any visits on 02/21/24.    Assessment & Plan:    Problem List Items Addressed This Visit     Bilateral nephrolithiasis   CT completed 02/2024:There is a 2 mm nonobstructive calculus within the lower pole of the leftkidney. There is a 3 mm nonobstructive calculus within the superior pole of the right kidney. There is a 4 mm calculus in the left lower pelvis which appearsto be in the distal ureter just proximal to the Ureterovesical Junction (seen on image 77 of series 2). There is moderate left Hydroureteronephrosis present.The right ureter is normal in caliber and free of calculi. No perinephric or periureteral stranding. Urinary bladder is unremarkable.  Had ED visit: pain management and flomax prescribed Reports resolved flank pain and no  hematuria. She was unable to strain stone Entered urology referral Advised to maintain adequate oral hydration, avoid sugary drinks, black tea and artificial sweeteners.      Relevant Orders   Ambulatory referral to Urology   DM (diabetes mellitus) (HCC)   Negative UACr  No Dm retinopathy Positive neuropathy (normal Tsh, B12, and folic acid ). BP at goal LDL not at goal, current use of crestor  and zetia  No adverse effects with glipizide , metformin , and rybelsus   Repeat hgbA1c and BMP Maintain med doses F/up in 3months       Relevant Orders   Renal Function Panel   Hemoglobin A1c   HYPERTENSION, BENIGN SYSTEMIC - Primary   Improved BP  control with amlodipine  10mg , and losartan  100mg  No adverse effects Positive hypertensive retinopathy-last end exam 2025 BP Readings from Last 3 Encounters:  02/21/24 132/80  02/06/24 (!) 142/82  11/24/23 128/74    Maintain med doses F/up in 65month      Relevant Orders   Renal Function Panel   Return in about 3 months (around 05/23/2024) for HTN, DM, hyperlipidemia (fasting).     Roselie Mood, NP

## 2024-02-21 NOTE — Assessment & Plan Note (Addendum)
 Improved BP control with amlodipine  10mg , and losartan  100mg  No adverse effects Positive hypertensive retinopathy-last end exam 2025 BP Readings from Last 3 Encounters:  02/21/24 132/80  02/06/24 (!) 142/82  11/24/23 128/74    Maintain med doses F/up in 73month

## 2024-02-21 NOTE — Assessment & Plan Note (Addendum)
 CT completed 02/2024:There is a 2 mm nonobstructive calculus within the lower pole of the leftkidney. There is a 3 mm nonobstructive calculus within the superior pole of the right kidney. There is a 4 mm calculus in the left lower pelvis which appearsto be in the distal ureter just proximal to the Ureterovesical Junction (seen on image 77 of series 2). There is moderate left Hydroureteronephrosis present.The right ureter is normal in caliber and free of calculi. No perinephric or periureteral stranding. Urinary bladder is unremarkable.  Had ED visit: pain management and flomax prescribed Reports resolved flank pain and no hematuria. She was unable to strain stone Entered urology referral Advised to maintain adequate oral hydration, avoid sugary drinks, black tea and artificial sweeteners.

## 2024-02-21 NOTE — Assessment & Plan Note (Signed)
 Negative UACr  No Dm retinopathy Positive neuropathy (normal Tsh, B12, and folic acid ). BP at goal LDL not at goal, current use of crestor  and zetia  No adverse effects with glipizide , metformin , and rybelsus   Repeat hgbA1c and BMP Maintain med doses F/up in 3months

## 2024-02-23 ENCOUNTER — Ambulatory Visit: Payer: Self-pay | Admitting: Nurse Practitioner

## 2024-02-23 DIAGNOSIS — E1169 Type 2 diabetes mellitus with other specified complication: Secondary | ICD-10-CM

## 2024-02-23 MED ORDER — RYBELSUS 14 MG PO TABS: 14.0000 mg | ORAL_TABLET | Freq: Every day | ORAL | 1 refills | Status: AC

## 2024-03-12 ENCOUNTER — Inpatient Hospital Stay: Admission: RE | Admit: 2024-03-12 | Discharge: 2024-03-12 | Attending: Nurse Practitioner

## 2024-03-12 DIAGNOSIS — Z1231 Encounter for screening mammogram for malignant neoplasm of breast: Secondary | ICD-10-CM

## 2024-05-23 ENCOUNTER — Ambulatory Visit: Payer: Self-pay | Admitting: Nurse Practitioner
